# Patient Record
Sex: Female | Born: 1945 | Race: Black or African American | Hispanic: No | Marital: Single | Smoking: Never smoker
Health system: Southern US, Community
[De-identification: ages and names within clinical notes are randomized; demographics above are authoritative.]

## PROBLEM LIST (undated history)

## (undated) DIAGNOSIS — I10 Essential (primary) hypertension: Secondary | ICD-10-CM

## (undated) DIAGNOSIS — F209 Schizophrenia, unspecified: Secondary | ICD-10-CM

## (undated) DIAGNOSIS — R7303 Prediabetes: Secondary | ICD-10-CM

## (undated) DIAGNOSIS — F319 Bipolar disorder, unspecified: Secondary | ICD-10-CM

---

## 2011-10-27 DIAGNOSIS — F259 Schizoaffective disorder, unspecified: Secondary | ICD-10-CM | POA: Diagnosis not present

## 2011-10-27 DIAGNOSIS — F209 Schizophrenia, unspecified: Secondary | ICD-10-CM | POA: Diagnosis not present

## 2012-01-25 ENCOUNTER — Emergency Department (INDEPENDENT_AMBULATORY_CARE_PROVIDER_SITE_OTHER)
Admission: EM | Admit: 2012-01-25 | Discharge: 2012-01-25 | Disposition: A | Discharge status: Home / Self Care | Payer: Medicare Other | Source: Home / Self Care

## 2012-01-25 ENCOUNTER — Encounter (HOSPITAL_COMMUNITY): Payer: Self-pay

## 2012-01-25 DIAGNOSIS — I10 Essential (primary) hypertension: Secondary | ICD-10-CM

## 2012-01-25 DIAGNOSIS — R7309 Other abnormal glucose: Secondary | ICD-10-CM

## 2012-01-25 DIAGNOSIS — F319 Bipolar disorder, unspecified: Secondary | ICD-10-CM | POA: Diagnosis not present

## 2012-01-25 DIAGNOSIS — R7303 Prediabetes: Secondary | ICD-10-CM

## 2012-01-25 HISTORY — DX: Bipolar disorder, unspecified: F31.9

## 2012-01-25 HISTORY — DX: Prediabetes: R73.03

## 2012-01-25 HISTORY — DX: Essential (primary) hypertension: I10

## 2012-01-25 MED ORDER — TELMISARTAN 40 MG PO TABS: 40.0000 mg | ORAL_TABLET | Freq: Every day | ORAL | Status: DC

## 2012-01-25 MED ORDER — RISPERIDONE 2 MG PO TABS: 2.0000 mg | ORAL_TABLET | Freq: Two times a day (BID) | ORAL | Status: DC

## 2012-01-25 MED ORDER — QUETIAPINE FUMARATE 25 MG PO TABS: 25.0000 mg | ORAL_TABLET | Freq: Every day | ORAL | Status: DC
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
## 2012-01-25 NOTE — Discharge Instructions (Signed)
Your blood sugar at this time is good. As we discussed, you will need to have your blood sugar followed by your primary care dr and that there are better tests for following pre diabetes than a finger stick blood sugar. I have refilled your medications for one month. You should follow up with a primary care dr at that time for further refills.  Hypertension Information As your heart beats, it forces blood through your arteries. This force is your blood pressure. If the pressure is too high, it is called hypertension (HTN) or high blood pressure. HTN is dangerous because you may have it and not know it. High blood pressure may mean that your heart has to work harder to pump blood. Your arteries may be narrow or stiff. The extra work puts you at risk for heart disease, stroke, and other problems.  Blood pressure consists of two numbers, a higher number over a lower, 110/72, for example. It is stated as "110 over 72." The ideal is below 120 for the top number (systolic) and under 80 for the bottom (diastolic).  You should pay close attention to your blood pressure if you have certain conditions such as:  Heart failure.   Prior heart attack.   Diabetes   Chronic kidney disease.   Prior stroke.   Multiple risk factors for heart disease.  To see if you have HTN, your blood pressure should be measured while you are seated with your arm held at the level of the heart. It should be measured at least twice. A one-time elevated blood pressure reading (especially in the Emergency Department) does not mean that you need treatment. There may be conditions in which the blood pressure is different between your right and left arms. It is important to see your caregiver soon for a recheck. Most people have essential hypertension which means that there is not a specific cause. This type of high blood pressure may be lowered by changing lifestyle factors such as:  Stress.   Smoking.   Lack of exercise.    Excessive weight.   Drug/tobacco/alcohol use.   Eating less salt.  Most people do not have symptoms from high blood pressure until it has caused damage to the body. Effective treatment can often prevent, delay or reduce that damage. TREATMENT  Treatment for high blood pressure, when a cause has been identified, is directed at the cause. There are a large number of medications to treat HTN. These fall into several categories, and your caregiver will help you select the medicines that are best for you. Medications may have side effects. You should review side effects with your caregiver. If your blood pressure stays high after you have made lifestyle changes or started on medicines,   Your medication(s) may need to be changed.   Other problems may need to be addressed.   Be certain you understand your prescriptions, and know how and when to take your medicine.   Be sure to follow up with your caregiver within the time frame advised (usually within two weeks) to have your blood pressure rechecked and to review your medications.   If you are taking more than one medicine to lower your blood pressure, make sure you know how and at what times they should be taken. Taking two medicines at the same time can result in blood pressure that is too low.  Document Released: 12/08/2005 Document Revised: 06/17/2011 Document Reviewed: 12/15/2007 [facility] Patient Information 2012 [place], [place].

## 2012-01-30 NOTE — ED Provider Notes (Signed)
Medical screening examination/treatment/procedure(s) were performed by non-physician practitioner and as supervising physician I was immediately available for consultation/collaboration.  Alen Bleacher, MD 01/30/12 2101

## 2012-03-03 DIAGNOSIS — M25569 Pain in unspecified knee: Secondary | ICD-10-CM | POA: Diagnosis not present

## 2012-03-03 DIAGNOSIS — F319 Bipolar disorder, unspecified: Secondary | ICD-10-CM | POA: Diagnosis not present

## 2012-03-03 DIAGNOSIS — Z13220 Encounter for screening for lipoid disorders: Secondary | ICD-10-CM | POA: Diagnosis not present

## 2012-03-03 DIAGNOSIS — J3089 Other allergic rhinitis: Secondary | ICD-10-CM | POA: Diagnosis not present

## 2012-03-03 DIAGNOSIS — E119 Type 2 diabetes mellitus without complications: Secondary | ICD-10-CM | POA: Diagnosis not present

## 2012-03-03 DIAGNOSIS — I10 Essential (primary) hypertension: Secondary | ICD-10-CM | POA: Diagnosis not present

## 2012-03-03 DIAGNOSIS — M545 Low back pain: Secondary | ICD-10-CM | POA: Diagnosis not present

## 2012-03-16 DIAGNOSIS — E119 Type 2 diabetes mellitus without complications: Secondary | ICD-10-CM | POA: Diagnosis not present

## 2012-03-16 DIAGNOSIS — I10 Essential (primary) hypertension: Secondary | ICD-10-CM | POA: Diagnosis not present

## 2012-03-16 DIAGNOSIS — J3089 Other allergic rhinitis: Secondary | ICD-10-CM | POA: Diagnosis not present

## 2012-04-20 DIAGNOSIS — F209 Schizophrenia, unspecified: Secondary | ICD-10-CM | POA: Diagnosis not present

## 2012-05-20 ENCOUNTER — Encounter (HOSPITAL_COMMUNITY): Payer: Self-pay | Admitting: *Deleted

## 2012-05-20 ENCOUNTER — Emergency Department (HOSPITAL_COMMUNITY)
Admission: EM | Admit: 2012-05-20 | Discharge: 2012-05-21 | Disposition: A | Discharge status: Other Acute Inpatient Hospital | Payer: Medicare Other | Source: Home / Self Care | Attending: Emergency Medicine | Admitting: Emergency Medicine

## 2012-05-20 ENCOUNTER — Encounter (HOSPITAL_COMMUNITY): Payer: Self-pay

## 2012-05-20 ENCOUNTER — Emergency Department (INDEPENDENT_AMBULATORY_CARE_PROVIDER_SITE_OTHER)
Admission: EM | Admit: 2012-05-20 | Discharge: 2012-05-20 | Disposition: A | Discharge status: Home / Self Care | Payer: Medicare Other | Source: Home / Self Care | Attending: Emergency Medicine | Admitting: Emergency Medicine

## 2012-05-20 DIAGNOSIS — R443 Hallucinations, unspecified: Secondary | ICD-10-CM

## 2012-05-20 DIAGNOSIS — R440 Auditory hallucinations: Secondary | ICD-10-CM

## 2012-05-20 DIAGNOSIS — R441 Visual hallucinations: Secondary | ICD-10-CM

## 2012-05-20 DIAGNOSIS — R7309 Other abnormal glucose: Secondary | ICD-10-CM | POA: Insufficient documentation

## 2012-05-20 DIAGNOSIS — F29 Unspecified psychosis not due to a substance or known physiological condition: Secondary | ICD-10-CM

## 2012-05-20 DIAGNOSIS — Z79899 Other long term (current) drug therapy: Secondary | ICD-10-CM | POA: Insufficient documentation

## 2012-05-20 DIAGNOSIS — E876 Hypokalemia: Secondary | ICD-10-CM | POA: Insufficient documentation

## 2012-05-20 DIAGNOSIS — H5316 Psychophysical visual disturbances: Secondary | ICD-10-CM

## 2012-05-20 LAB — CBC
HCT: 33.2 % — ABNORMAL LOW (ref 36.0–46.0)
MCV: 81 fL (ref 78.0–100.0)
RBC: 4.1 MIL/uL (ref 3.87–5.11)
WBC: 6.9 10*3/uL (ref 4.0–10.5)

## 2012-05-20 MED ORDER — NAPROXEN SODIUM 550 MG PO TABS
550.0000 mg | ORAL_TABLET | Freq: Two times a day (BID) | ORAL | Status: DC
  Administered 2012-05-21: 550 mg via ORAL
  Filled 2012-05-20 (×4): qty 1

## 2012-05-20 MED ORDER — DIVALPROEX SODIUM 250 MG PO DR TAB
250.0000 mg | DELAYED_RELEASE_TABLET | Freq: Three times a day (TID) | ORAL | Status: DC
  Administered 2012-05-21 (×3): 250 mg via ORAL
  Filled 2012-05-20 (×3): qty 1

## 2012-05-20 MED ORDER — LURASIDONE HCL 40 MG PO TABS
40.0000 mg | ORAL_TABLET | Freq: Every day | ORAL | Status: DC
  Administered 2012-05-21: 40 mg via ORAL
  Filled 2012-05-20 (×2): qty 1

## 2012-05-20 MED ORDER — SERTRALINE HCL 50 MG PO TABS
50.0000 mg | ORAL_TABLET | Freq: Every day | ORAL | Status: DC
  Administered 2012-05-21: 50 mg via ORAL
  Filled 2012-05-20: qty 1

## 2012-05-20 MED ORDER — LORAZEPAM 1 MG PO TABS
1.0000 mg | ORAL_TABLET | Freq: Three times a day (TID) | ORAL | Status: DC | PRN
  Administered 2012-05-21: 1 mg via ORAL
  Filled 2012-05-20: qty 1

## 2012-05-20 MED ORDER — IRBESARTAN 150 MG PO TABS
150.0000 mg | ORAL_TABLET | Freq: Every day | ORAL | Status: DC
  Administered 2012-05-21: 150 mg via ORAL
  Filled 2012-05-20: qty 1

## 2012-05-20 MED ORDER — MIDAZOLAM HCL 2 MG/2ML IJ SOLN
INTRAMUSCULAR | Status: AC
  Administered 2012-05-20: 5 mg
  Filled 2012-05-20: qty 6

## 2012-05-20 MED ORDER — MIDAZOLAM HCL 5 MG/ML IJ SOLN: 5.0000 mg | Freq: Once | INTRAMUSCULAR | Status: DC

## 2012-05-20 MED ORDER — QUETIAPINE FUMARATE 25 MG PO TABS
25.0000 mg | ORAL_TABLET | Freq: Every day | ORAL | Status: DC
  Administered 2012-05-21: 25 mg via ORAL
  Filled 2012-05-20: qty 1

## 2012-05-20 NOTE — ED Provider Notes (Addendum)
History     CSN: [id number]  Arrival date & time 05/20/12  2129   First MD Initiated Contact with Patient 05/20/12 2235      Chief Complaint  Patient presents with  . Medical Clearance    (Consider location/radiation/quality/duration/timing/severity/associated sxs/prior treatment) HPI L5 caveat. Patient highly agitated, history is obtained from niece who lives with patient accompanies her. Niece reports patient has not been taking any of her medicines for the past several days becoming increasingly agitated and hallucinating. Patient has long-standing history of mental illness for several decades. Past Medical History  Diagnosis Date  . Hypertension   . Pre-diabetes   . Bipolar affective disorder     History reviewed. No pertinent past surgical history.  Family History  Problem Relation Age of Onset  . Hypertension Mother   . Colon cancer Mother   . Diabetes Mother   . Hypertension Father     History  Substance Use Topics  . Smoking status: Never Smoker   . Smokeless tobacco: Not on file  . Alcohol Use: No    OB History    Grav Para Term Preterm Abortions TAB SAB Ect Mult Living                  Review of Systems  Unable to perform ROS: Psychiatric disorder    Allergies  Review of patient's allergies indicates no known allergies.  Home Medications   Current Outpatient Rx  Name Route Sig Dispense Refill  . DIVALPROEX SODIUM 250 MG PO TBEC Oral Take 250 mg by mouth 3 (three) times daily.    Marland Kitchen LURASIDONE HCL 40 MG PO TABS Oral Take 40 mg by mouth at bedtime.    Marland Kitchen NAPROXEN SODIUM 550 MG PO TABS Oral Take 550 mg by mouth 2 (two) times daily with a meal.    . QUETIAPINE FUMARATE 25 MG PO TABS Oral Take 1 tablet (25 mg total) by mouth at bedtime. 30 tablet 0  . SERTRALINE HCL 50 MG PO TABS Oral Take 50 mg by mouth daily.    . TELMISARTAN 40 MG PO TABS Oral Take 1 tablet (40 mg total) by mouth daily. 30 tablet 0    BP 172/63  Pulse 66  Temp 98.8 F (37.1 C)  (Oral)  Resp 18  SpO2 99%  Physical Exam  Nursing note and vitals reviewed. Constitutional: She appears well-developed and well-nourished.       Highly agitated follow simple commands  HENT:  Head: Normocephalic and atraumatic.  Eyes: Conjunctivae are normal. Pupils are equal, round, and reactive to light.  Neck: Neck supple. No tracheal deviation present. No thyromegaly present.  Cardiovascular: Normal rate and regular rhythm.   Pulmonary/Chest: Effort normal and breath sounds normal.  Abdominal: Soft. She exhibits no distension. There is no tenderness.       Obese  Musculoskeletal: Normal range of motion. She exhibits no edema and no tenderness.  Neurological: She is alert. Coordination normal.  Skin: Skin is warm and dry. No rash noted.  Psychiatric:       Highly agitated, yelling    ED Course  Procedures (including critical care time)   Labs Reviewed  CBC  COMPREHENSIVE METABOLIC PANEL  ETHANOL  ACETAMINOPHEN LEVEL  URINE RAPID DRUG SCREEN (HOSP PERFORMED)   No results found. Results for orders placed during the hospital encounter of 05/20/12  CBC      Component Value Range   WBC 6.9  4.0 - 10.5 K/uL   RBC 4.10  3.87 - 5.11 MIL/uL   Hemoglobin 11.1 (*) 12.0 - 15.0 g/dL   HCT 16.1 (*) 09.6 - 04.5 %   MCV 81.0  78.0 - 100.0 fL   MCH 27.1  26.0 - 34.0 pg   MCHC 33.4  30.0 - 36.0 g/dL   RDW 40.9  81.1 - 91.4 %   Platelets 155  150 - 400 K/uL  COMPREHENSIVE METABOLIC PANEL      Component Value Range   Sodium 141  135 - 145 mEq/L   Potassium 2.9 (*) 3.5 - 5.1 mEq/L   Chloride 103  96 - 112 mEq/L   CO2 29  19 - 32 mEq/L   Glucose, Bld 144 (*) 70 - 99 mg/dL   BUN 9  6 - 23 mg/dL   Creatinine, Ser 7.82  0.50 - 1.10 mg/dL   Calcium 9.3  8.4 - 95.6 mg/dL   Total Protein 7.3  6.0 - 8.3 g/dL   Albumin 3.8  3.5 - 5.2 g/dL   AST 20  0 - 37 U/L   ALT 12  0 - 35 U/L   Alkaline Phosphatase 57  39 - 117 U/L   Total Bilirubin 0.3  0.3 - 1.2 mg/dL   GFR calc non Af Amer  65 (*) >90 mL/min   GFR calc Af Amer 75 (*) >90 mL/min  ETHANOL      Component Value Range   Alcohol, Ethyl (B) <11  0 - 11 mg/dL  ACETAMINOPHEN LEVEL      Component Value Range   Acetaminophen (Tylenol), Serum <15.0  10 - 30 ug/mL   No results found.   No diagnosis found. 11:20 PM patient much more pleasant cooperative and calm appearing treatment with versed IM  MDM  Plan psychiatric evaluation Diagnosis #1 psychosis #2 hypokalemia #3 hyperglycemia      Doug Sou, MD 05/21/12 2130  Doug Sou, MD 05/21/12 0031  Psychiatry consultation is appreciated. Psychiatry recommends inpatient psychiatric care and does not have any recommendations for changing her medications.  Dione Booze, MD 05/21/12 [phone]

## 2012-05-20 NOTE — ED Notes (Signed)
Pt presents with family.  Pt admits to not taking her medication- Admits to hearing voices.  Admit voices are telling her to harm self and others.  Admit to SI/HI

## 2012-05-20 NOTE — ED Notes (Signed)
Triage states do not draw pt's labs at this time pt. Is combative.  Will inform lab staff when is ok to obtain labs.
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
## 2012-05-20 NOTE — ED Notes (Signed)
Per niece ( pt lives with) pt has not been taking meds for over one week, states that she has been having visual and auditory hallucinations. Pt difficult to understand. Family reports that ambulance was called for tx last week and pt refused. Has been hospitalized for mental illness in the past - many years ago per family.

## 2012-05-21 ENCOUNTER — Inpatient Hospital Stay (HOSPITAL_COMMUNITY)
Admission: RE | Admit: 2012-05-21 | Discharge: 2012-06-08 | DRG: 885 | Disposition: A | Discharge status: Home / Self Care | Payer: Medicare Other | Source: Ambulatory Visit | Attending: Psychiatry | Admitting: Psychiatry

## 2012-05-21 ENCOUNTER — Encounter (HOSPITAL_COMMUNITY): Payer: Self-pay | Admitting: *Deleted

## 2012-05-21 DIAGNOSIS — F200 Paranoid schizophrenia: Secondary | ICD-10-CM

## 2012-05-21 DIAGNOSIS — R7309 Other abnormal glucose: Secondary | ICD-10-CM | POA: Diagnosis present

## 2012-05-21 DIAGNOSIS — I10 Essential (primary) hypertension: Secondary | ICD-10-CM | POA: Diagnosis present

## 2012-05-21 DIAGNOSIS — Z79899 Other long term (current) drug therapy: Secondary | ICD-10-CM

## 2012-05-21 LAB — COMPREHENSIVE METABOLIC PANEL
AST: 20 U/L (ref 0–37)
Alkaline Phosphatase: 57 U/L (ref 39–117)
BUN: 9 mg/dL (ref 6–23)
CO2: 29 mEq/L (ref 19–32)
Chloride: 103 mEq/L (ref 96–112)
Creatinine, Ser: 0.92 mg/dL (ref 0.50–1.10)
GFR calc non Af Amer: 65 mL/min — ABNORMAL LOW (ref >90)
Total Bilirubin: 0.3 mg/dL (ref 0.3–1.2)

## 2012-05-21 LAB — VALPROIC ACID LEVEL: Valproic Acid Lvl: 52 ug/mL (ref 50.0–100.0)

## 2012-05-21 LAB — RAPID URINE DRUG SCREEN, HOSP PERFORMED
Amphetamines: NOT DETECTED
Barbiturates: NOT DETECTED

## 2012-05-21 LAB — ETHANOL: Alcohol, Ethyl (B): <11 mg/dL (ref 0–11)

## 2012-05-21 LAB — ACETAMINOPHEN LEVEL: Acetaminophen (Tylenol), Serum: <15 ug/mL (ref 10–30)

## 2012-05-21 MED ORDER — CLONIDINE HCL 0.1 MG PO TABS
0.2000 mg | ORAL_TABLET | Freq: Once | ORAL | Status: AC
  Administered 2012-05-21: 0.2 mg via ORAL
  Filled 2012-05-21: qty 1

## 2012-05-21 MED ORDER — MAGNESIUM HYDROXIDE 400 MG/5ML PO SUSP: 30.0000 mL | Freq: Every day | ORAL | Status: DC | PRN

## 2012-05-21 MED ORDER — SERTRALINE HCL 50 MG PO TABS
50.0000 mg | ORAL_TABLET | Freq: Every day | ORAL | Status: DC
  Administered 2012-05-22 – 2012-06-08 (×18): 50 mg via ORAL
  Filled 2012-05-21 (×18): qty 1
  Filled 2012-05-21: qty 14

## 2012-05-21 MED ORDER — LURASIDONE HCL 40 MG PO TABS
40.0000 mg | ORAL_TABLET | Freq: Every day | ORAL | Status: DC
  Administered 2012-05-21 – 2012-05-22 (×2): 40 mg via ORAL
  Filled 2012-05-21 (×4): qty 1

## 2012-05-21 MED ORDER — QUETIAPINE FUMARATE 25 MG PO TABS
25.0000 mg | ORAL_TABLET | Freq: Every day | ORAL | Status: DC
  Administered 2012-05-21: 25 mg via ORAL
  Filled 2012-05-21 (×4): qty 1

## 2012-05-21 MED ORDER — QUETIAPINE FUMARATE 25 MG PO TABS: 25.0000 mg | ORAL_TABLET | Freq: Every day | ORAL | Status: DC

## 2012-05-21 MED ORDER — DIVALPROEX SODIUM ER 250 MG PO TB24: 250.0000 mg | ORAL_TABLET | Freq: Three times a day (TID) | ORAL | Status: DC

## 2012-05-21 MED ORDER — DIVALPROEX SODIUM 250 MG PO DR TAB
250.0000 mg | DELAYED_RELEASE_TABLET | Freq: Three times a day (TID) | ORAL | Status: DC
  Administered 2012-05-22 – 2012-05-23 (×5): 250 mg via ORAL
  Filled 2012-05-21 (×7): qty 1

## 2012-05-21 MED ORDER — IRBESARTAN 150 MG PO TABS: 150.0000 mg | ORAL_TABLET | Freq: Every day | ORAL | Status: DC

## 2012-05-21 MED ORDER — FLUTICASONE PROPIONATE 50 MCG/ACT NA SUSP
2.0000 | Freq: Every day | NASAL | Status: DC
  Administered 2012-05-22 – 2012-06-07 (×16): 2 via NASAL
  Filled 2012-05-21 (×2): qty 16

## 2012-05-21 MED ORDER — NAPROXEN SODIUM 550 MG PO TABS: 550.0000 mg | ORAL_TABLET | Freq: Two times a day (BID) | ORAL | Status: DC | PRN

## 2012-05-21 MED ORDER — ACETAMINOPHEN 325 MG PO TABS
650.0000 mg | ORAL_TABLET | Freq: Four times a day (QID) | ORAL | Status: DC | PRN
  Administered 2012-05-30: 650 mg via ORAL

## 2012-05-21 MED ORDER — ALUM & MAG HYDROXIDE-SIMETH 200-200-20 MG/5ML PO SUSP: 30.0000 mL | ORAL | Status: DC | PRN

## 2012-05-21 MED ORDER — IRBESARTAN 150 MG PO TABS
150.0000 mg | ORAL_TABLET | Freq: Every day | ORAL | Status: DC
  Administered 2012-05-22 – 2012-06-08 (×18): 150 mg via ORAL
  Filled 2012-05-21 (×9): qty 1
  Filled 2012-05-21: qty 14
  Filled 2012-05-21 (×9): qty 1

## 2012-05-21 MED ORDER — LURASIDONE HCL 40 MG PO TABS: 40.0000 mg | ORAL_TABLET | Freq: Every day | ORAL | Status: DC

## 2012-05-21 MED ORDER — SERTRALINE HCL 50 MG PO TABS: 50.0000 mg | ORAL_TABLET | Freq: Every day | ORAL | Status: DC

## 2012-05-21 MED ORDER — POTASSIUM CHLORIDE CRYS ER 20 MEQ PO TBCR
40.0000 meq | EXTENDED_RELEASE_TABLET | Freq: Two times a day (BID) | ORAL | Status: DC
  Administered 2012-05-21 (×2): 40 meq via ORAL
  Filled 2012-05-21 (×2): qty 2

## 2012-05-21 MED ORDER — NAPROXEN SODIUM 550 MG PO TABS
550.0000 mg | ORAL_TABLET | Freq: Two times a day (BID) | ORAL | Status: DC
  Filled 2012-05-21: qty 1

## 2012-05-21 NOTE — Progress Notes (Signed)
Patient ID: Kaylee Kelley, female   DOB: [birth year], 66 y.o.   MRN: [id number] 05-21-12 @ 1913 nursing admission note: pt came into [facility] voluntarily with an admitting dx of psychotic d/o nos. She has a hx of bipolar affective d/o. During the admission process she was looking area the room bewildered. rn ask he if she was hallucinating and she stated "yes'. She verbalized auditory and visual hallucinations. She denied any si/hi. She is an extremely poor historian. She stated she is a non smoke, had no pain, no allergies, and did not seem to be a fall risk. She stated she lives alone is single and had one child that is deceased. She staed she has 2 grand children. She supports herself on disability. This pt was difficult to assess and many questions were unanswered and her care plans are incomplete.  Pt was given food/drink and escorted to the 400 hall. She was cooperative.   Emergency contact is yvonne pinder-niece- pt didn't know the ph # at this time.

## 2012-05-21 NOTE — BH Assessment (Signed)
Assessment Note   Kaylee Kelley is an 66 y.o. female who voluntarily presented to [facility] Emergency Department with the chief complaint of psychosis, evidenced by auditory and visual hallucinations experienced by patient. During assessment patient was observed to be a poor historian but demonstrated orientation x4. Patient reported that she is currently in the ED because "my nerves were bad. I was sick." Patient exhibits pressured speech at low volume and flat affect. Patient reported that she has a poor appetite and has recently suffered from episodes of insomnia. Patient has a noted mental health diagnosis of Bipolar affective disorder. Patient reported to assessor that she continues to hear a female voice constantly. "I hear this voice all the time. He just keeps talking." Patient also stated that she sees images and shadows that are trying to hit her. "Thats why I can't sleep because I see them." Per pt's niece, pt has not been taking any of her medications for the past several days, stimulating more agitation and episodes of hallucinations. Patient denies SI/HI at this time but does require inpatient treatment for stabilization due to psychosis.   Axis I: Psychotic Disorder NOS Axis II: No diagnosis Axis III:  Past Medical History  Diagnosis Date  . Hypertension   . Pre-diabetes   . Bipolar affective disorder    Axis IV: other psychosocial or environmental problems Axis V: 21-30 behavior considerably influenced by delusions or hallucinations OR serious impairment in judgment, communication OR inability to function in almost all areas  Past Medical History:  Past Medical History  Diagnosis Date  . Hypertension   . Pre-diabetes   . Bipolar affective disorder     History reviewed. No pertinent past surgical history.  Family History:  Family History  Problem Relation Age of Onset  . Hypertension Mother   . Colon cancer Mother   . Diabetes Mother   . Hypertension Father      Social History:  reports that she has never smoked. She does not have any smokeless tobacco history on file. She reports that she does not drink alcohol or use illicit drugs.  Additional Social History:  Alcohol / Drug Use Pain Medications: See MAR Prescriptions: See MAR Over the Counter: See MAR History of alcohol / drug use?: No history of alcohol / drug abuse  CIWA: CIWA-Ar BP: 179/93 mmHg Pulse Rate: 60  COWS:    Allergies: No Known Allergies  Home Medications:  (Not in a hospital admission)  OB/GYN Status:  No LMP recorded. Patient is postmenopausal.  General Assessment Data Location of Assessment: [facility] ED Living Arrangements: Other relatives (Niece) Can pt return to current living arrangement?: Yes Admission Status: Voluntary Is patient capable of signing voluntary admission?: No Transfer from: Acute Hospital Referral Source: Self/Family/Friend  Education Status Is patient currently in school?: No  Risk to self Suicidal Ideation: No-Not Currently/Within Last 6 Months Suicidal Intent: No-Not Currently/Within Last 6 Months Is patient at risk for suicide?: Yes Suicidal Plan?: No Access to Means: Yes Specify Access to Suicidal Means: Access to sharp objects  What has been your use of drugs/alcohol within the last 12 months?: N/A Previous Attempts/Gestures:  (Unknown) Triggers for Past Attempts: Unknown Intentional Self Injurious Behavior: None Family Suicide History: No Recent stressful life event(s): Other (Comment) (Recent psychosis) Persecutory voices/beliefs?: No Depression: Yes Depression Symptoms: Loss of interest in usual pleasures;Insomnia Substance abuse history and/or treatment for substance abuse?: No Suicide prevention information given to non-admitted patients: Not applicable  Risk to Others Homicidal Ideation: No-Not Currently/Within  Last 6 Months Thoughts of Harm to Others: No-Not Currently Present/Within Last 6 Months Current Homicidal  Intent: No-Not Currently/Within Last 6 Months Current Homicidal Plan: No-Not Currently/Within Last 6 Months Access to Homicidal Means: No Identified Victim: N/A History of harm to others?: No Assessment of Violence: None Noted Violent Behavior Description: Pt is calm and cooperative Does patient have access to weapons?: No Criminal Charges Pending?: No Does patient have a court date: No  Psychosis Hallucinations: Auditory;Visual (Pt hears female voice talking to her consistently and sees ima) Delusions: None noted  Mental Status Report Appear/Hygiene: Disheveled Eye Contact: Fair Motor Activity: Freedom of movement;Agitation;Restlessness Speech: Slow;Pressured Level of Consciousness: Quiet/awake Mood: Empty;Helpless;Sad Affect: Appropriate to circumstance;Blunted Anxiety Level: None Thought Processes: Coherent;Flight of Ideas Judgement: Impaired Orientation: Person;Place;Time;Situation Obsessive Compulsive Thoughts/Behaviors: None  Cognitive Functioning Concentration: Decreased Memory: Recent Intact IQ: Average Insight: Poor Impulse Control: Poor Appetite: Fair Weight Loss: 0  Weight Gain: 0  Sleep: Decreased Total Hours of Sleep: 5  Vegetative Symptoms: None  ADLScreening [facility] Assessment Services) Patient's cognitive ability adequate to safely complete daily activities?: Yes Patient able to express need for assistance with ADLs?: Yes Independently performs ADLs?: Yes  Abuse/Neglect [facility]) Physical Abuse: Denies Verbal Abuse: Denies Sexual Abuse: Denies  Prior Inpatient Therapy Prior Inpatient Therapy:  (Pt unable to provide information)  Prior Outpatient Therapy Prior Outpatient Therapy:  (Pt unable to provide information. )  ADL Screening (condition at time of admission) Patient's cognitive ability adequate to safely complete daily activities?: Yes Patient able to express need for assistance with ADLs?: Yes Independently performs ADLs?: Yes Weakness of  Legs: None Weakness of Arms/Hands: None  Home Assistive Devices/Equipment Home Assistive Devices/Equipment: None  Therapy Consults (therapy consults require a physician order) PT Evaluation Needed: No OT Evalulation Needed: No SLP Evaluation Needed: No Abuse/Neglect Assessment (Assessment to be complete while patient is alone) Physical Abuse: Denies Verbal Abuse: Denies Sexual Abuse: Denies Exploitation of patient/patient's resources: Denies Self-Neglect: Denies Values / Beliefs Cultural Requests During Hospitalization: None Spiritual Requests During Hospitalization: None Consults Spiritual Care Consult Needed: No Social Work Consult Needed: No      Additional Information 1:1 In Past 12 Months?: No CIRT Risk: No Elopement Risk: No Does patient have medical clearance?: Yes     Disposition: Referral to [facility]  Disposition Disposition of Patient: Inpatient treatment program Type of inpatient treatment program: Adult  On Site Evaluation by:  Self Reviewed with Physician:  Dr. Ella Bodo, Yilia Sacca C 05/21/2012 10:45 AM

## 2012-05-21 NOTE — Tx Team (Signed)
Initial Interdisciplinary Treatment Plan  PATIENT STRENGTHS: (choose at least two) Supportive family/friends  PATIENT STRESSORS: psychosis   PROBLEM LIST: Problem List/Patient Goals Date to be addressed Date deferred Reason deferred Estimated date of resolution  Positive for auditory and visual hallucinations.  05-21-12                                                      DISCHARGE CRITERIA:  Improved stabilization in mood, thinking, and/or behavior posit  PRELIMINARY DISCHARGE PLAN: Attend aftercare/continuing care group  PATIENT/FAMIILY INVOLVEMENT: This treatment plan has been presented to and reviewed with the patient, [employer], and/or family member, .  The patient and family have been given the opportunity to ask questions and make suggestions.  Valente David 05/21/2012, 7:19 PM

## 2012-05-21 NOTE — ED Notes (Signed)
Dr Radford Pax aware of increase Bp  And to repeat potassium labs For [facility] admssion

## 2012-05-22 DIAGNOSIS — F200 Paranoid schizophrenia: Secondary | ICD-10-CM | POA: Diagnosis not present

## 2012-05-22 LAB — GLUCOSE, CAPILLARY
Glucose-Capillary: 90 mg/dL (ref 70–99)
Glucose-Capillary: 92 mg/dL (ref 70–99)

## 2012-05-22 MED ORDER — QUETIAPINE FUMARATE 50 MG PO TABS: 50.0000 mg | ORAL_TABLET | ORAL | Status: DC | PRN

## 2012-05-22 MED ORDER — NAPROXEN 500 MG PO TABS
500.0000 mg | ORAL_TABLET | Freq: Two times a day (BID) | ORAL | Status: DC
  Administered 2012-05-22 – 2012-06-08 (×31): 500 mg via ORAL
  Filled 2012-05-22 (×19): qty 1
  Filled 2012-05-22: qty 28
  Filled 2012-05-22 (×2): qty 1
  Filled 2012-05-22: qty 28
  Filled 2012-05-22 (×15): qty 1

## 2012-05-22 MED ORDER — QUETIAPINE FUMARATE 50 MG PO TABS
50.0000 mg | ORAL_TABLET | Freq: Every day | ORAL | Status: DC
  Administered 2012-05-22: 50 mg via ORAL
  Filled 2012-05-22 (×3): qty 1

## 2012-05-22 NOTE — Progress Notes (Signed)
[facility] Group Notes:  (Counselor/Nursing/MHT/Case Management/Adjunct)  05/22/2012 11 AM  Type of Therapy:  Aftercare Planning, Group Therapy, Dance/Movement Therapy   Participation Level:  Did Not Attend  Summary of Progress/Problems: pt did not attend but did accept the daily workbook on support systems.  Kaylee Kelley 05/22/2012. 11:38 AM  

## 2012-05-22 NOTE — Progress Notes (Signed)
Psychoeducational Group Note  Date:  05/22/2012 Time:  0945 am  Group Topic/Focus:  Making Healthy Choices:   The focus of this group is to help patients identify negative/unhealthy choices they were using prior to admission and identify positive/healthier coping strategies to replace them upon discharge.  Participation Level:  Did Not Attend   Kaylee Kelley J 05/22/2012, 10:29 AM  

## 2012-05-22 NOTE — H&P (Signed)
  Pt was seen by me today. Will continue current meds.  The detailed H&P note will be done by mid level (NP/PA).  
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
## 2012-05-22 NOTE — Progress Notes (Signed)
Patient received her scheduled night medications and was pleasant and cooperative. Patient appears anxious and her speech is soft and pressured.  Patient requested fixodent for her dentures which was given. Patient voiced no complaints. Writer offered support and encouragement, denies having pain. Safety maintained on unit, will continue to monitor.
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
## 2012-05-22 NOTE — Progress Notes (Signed)
Pt has been awake in room with door open laughing and talking loud with no one in the room. Writer unable to identify what pt is saying. Speech is loud and fast. When writer enters the room, pt becomes quiet and avoids eye contact. Pt denies si and hi. She denies hallucinations. She appears to be responding to internal stimuli. Pt has curtains closed, lights off and door to room open. When writer asked if door could be closed, pt responded "no." Asked pt if she could lower voice and she responded yes as other patients on unit were being disturbed. Reported to PA.. MD to evaluate. No new orders at this time. Gave medications as ordered. Rechecked vitals. Safety maintained on unit.

## 2012-05-23 DIAGNOSIS — F200 Paranoid schizophrenia: Principal | ICD-10-CM

## 2012-05-23 LAB — URINALYSIS, ROUTINE W REFLEX MICROSCOPIC
Leukocytes, UA: NEGATIVE
Nitrite: NEGATIVE
Specific Gravity, Urine: 1.016 (ref 1.005–1.030)
pH: 6.5 (ref 5.0–8.0)

## 2012-05-23 MED ORDER — DIVALPROEX SODIUM ER 500 MG PO TB24
1000.0000 mg | ORAL_TABLET | Freq: Every day | ORAL | Status: DC
  Administered 2012-05-24 – 2012-05-26 (×3): 1000 mg via ORAL
  Filled 2012-05-23 (×5): qty 2

## 2012-05-23 MED ORDER — LURASIDONE HCL 40 MG PO TABS
40.0000 mg | ORAL_TABLET | Freq: Two times a day (BID) | ORAL | Status: DC
  Administered 2012-05-24 – 2012-05-27 (×7): 40 mg via ORAL
  Filled 2012-05-23 (×11): qty 1

## 2012-05-23 NOTE — Treatment Plan (Signed)
Interdisciplinary Treatment Plan Update (Adult) Date: 05/23/2012  Time Reviewed: 10:02 AM  Progress in Treatment: Attending groups: Yes Participating in groups: Yes Taking medication as prescribed: Yes Tolerating medication: Yes Family/Significant othe contact made: No contacts needed to be made with pt. niece Patient understands diagnosis: Yes Discussing patient identified problems/goals with staff: Yes Medical problems stabilized or resolved: Yes Denies suicidal/homicidal ideation: Yes Issues/concerns per patient self-inventory: None identified Other: N/A New problem(s) identified: None Identified Reason for Continuation of Hospitalization: Aggression Medication stabilization Suicidal ideation Interventions implemented related to continuation of hospitalization: mood stabilization, medication monitoring and adjustment, group therapy and psycho education, safety checks q 15 mins Additional comments: N/A Estimated length of stay:  Discharge Plan: SW is assessing for appropriate referrals.  New goal(s): N/A Review of initial/current patient goals per problem list:  1. Goal(s): Observation to assess symptoms, need for medications.  Met: No  Target date: By Discharge  As evidenced by: Just arrived 05-21-12 2. Goal(s): Gather collateral information  Met: No  Target date: By Discharge  As evidenced by: New 3. Goal(s): Education re resources.  Met: No  Target date: By Discharge  As evidenced by: New 4. Goal(s): Provide Suicide Prevention Information  Met: Yes  Target date: By Discharge  As evidenced by: Done in Aftercare Planning Group Attendees: Patient:    Family:    Physician: Kaylee Gallo, MD 05/23/2012 10:02 AM   Nursing:    Case Manager: Clarice Pole, LCASA 05/23/2012 10:02 AM   Counselor: Veto Kemps, MT-BC 05/23/2012 10:02 AM   Other: Joslyn Devon, RN 05/23/2012 10:02 AM   Other:    Other:    Other:    Scribe for Treatment Team:  Clarice Pole, LCASA  05/23/2012 10:02 AM

## 2012-05-23 NOTE — Discharge Planning (Signed)
05/23/2012  Pt did not attend d/c planning group on this date. SW met with pt individually at this time.   SW met with pt in treatment team.    Per State Regulation 482.30 This Chart was reviewed for medical necessity with respect to the patient's Admission/Duration of stay.  Kaylee Kelley 05/23/2012 Next Review Date:   Kaylee Kelley 05/23/2012, 5:15 PM

## 2012-05-23 NOTE — Progress Notes (Signed)
[facility] MD Progress Note  05/23/2012 7:00 PM  Diagnosis:  Axis I: Schizophrenia - Paranoid Type.   The patient was seen today and reports the following:   ADL's: Intact.  Sleep: The patient reports to having difficulty initiating and maintaining sleep. Appetite: The patient reports that her appetite is improving today.   Mild>(1-10) >Severe  Hopelessness (1-10): 0  Depression (1-10): 3-4  Anxiety (1-10): 0   Suicidal Ideation: The patient denies any current suicidal ideations today.  Plan: No  Intent: No  Means: No   Homicidal Ideation: The patient denies any homicidal ideations today.  Plan: No  Intent: No.  Means: No   General Appearance/Behavior: The patient was mild to moderately agitated and was minimally cooperative with this provider. Eye Contact: Good.  Speech: Appropriate in rate and volume with no pressuring noted today.  Motor Behavior: wnl.  Level of Consciousness: Alert and Oriented x 3.  Mental Status: Alert and Oriented x 3.  Mood: Mild to moderately depressed.  Affect: Moderately agitated.  Anxiety Level: No anxiety reported today.  Thought Process: Psychotic.  Thought Content: The patient appears to be experiencing auditory hallucinations as well as paranoid delusions.  She denies any visual hallucinations. Perception:. Psychotic.  Judgment: Poor.  Insight: Poor.  Cognition: Oriented to person, place and time.  Sleep:  Number of Hours: 2.75    Vital Signs:Blood pressure 150/87, pulse 58, temperature 98.3 F (36.8 C), temperature source Oral, resp. rate 20, height 5' 1.5" (1.562 m), weight 69.854 kg (154 lb), SpO2 100.00%.  Current Medications: Current Facility-Administered Medications  Medication Dose Route Frequency Provider Last Rate Last Dose  . acetaminophen (TYLENOL) tablet 650 mg  650 mg Oral Q6H PRN Verne Spurr, PA-C      . alum & mag hydroxide-simeth (MAALOX/MYLANTA) 200-200-20 MG/5ML suspension 30 mL  30 mL Oral Q4H PRN Verne Spurr, PA-C       . divalproex (DEPAKOTE ER) 24 hr tablet 1,000 mg  1,000 mg Oral QHS Curlene Labrum Pamula Luther, MD      . fluticasone (FLONASE) 50 MCG/ACT nasal spray 2 spray  2 spray Each Nare Daily Ronny Bacon, MD   2 spray at 05/23/12 [phone]  . irbesartan (AVAPRO) tablet 150 mg  150 mg Oral Daily Curlene Labrum Marche Hottenstein, MD   150 mg at 05/23/12 0830  . lurasidone (LATUDA) tablet 40 mg  40 mg Oral BID WC Merrilyn Legler D Willoughby Doell, MD      . magnesium hydroxide (MILK OF MAGNESIA) suspension 30 mL  30 mL Oral Daily PRN Verne Spurr, PA-C      . naproxen (NAPROSYN) tablet 500 mg  500 mg Oral BID WC Curlene Labrum Jackelin Correia, MD   500 mg at 05/23/12 1736  . QUEtiapine (SEROQUEL) tablet 50 mg  50 mg Oral PRN Wonda Cerise, MD      . sertraline (ZOLOFT) tablet 50 mg  50 mg Oral Daily Curlene Labrum Shaunte Weissinger, MD   50 mg at 05/23/12 0830  . DISCONTD: divalproex (DEPAKOTE) DR tablet 250 mg  250 mg Oral TID Verne Spurr, PA-C   250 mg at 05/23/12 1159  . DISCONTD: lurasidone (LATUDA) tablet 40 mg  40 mg Oral QHS Verne Spurr, PA-C   40 mg at 05/22/12 2137  . DISCONTD: QUEtiapine (SEROQUEL) tablet 25 mg  25 mg Oral QHS Verne Spurr, PA-C   25 mg at 05/21/12 2157  . DISCONTD: QUEtiapine (SEROQUEL) tablet 50 mg  50 mg Oral QHS Wonda Cerise, MD   50 mg at 05/22/12  2137   Lab Results:  Results for orders placed during the hospital encounter of 05/21/12 (from the past 48 hour(s))  GLUCOSE, CAPILLARY     Status: Normal   Collection Time   05/22/12  5:50 AM      Component Value Range Comment   Glucose-Capillary 90  70 - 99 mg/dL   GLUCOSE, CAPILLARY     Status: Normal   Collection Time   05/22/12  9:39 PM      Component Value Range Comment   Glucose-Capillary 92  70 - 99 mg/dL   GLUCOSE, CAPILLARY     Status: Normal   Collection Time   05/23/12  6:22 AM      Component Value Range Comment   Glucose-Capillary 93  70 - 99 mg/dL    Physical Findings: AIMS: Facial and Oral Movements Muscles of Facial Expression: None, normal Lips and Perioral Area: None,  normal Jaw: None, normal Tongue: None, normal,Extremity Movements Upper (arms, wrists, hands, fingers): None, normal Lower (legs, knees, ankles, toes): None, normal, Trunk Movements Neck, shoulders, hips: None, normal, Overall Severity Severity of abnormal movements (highest score from questions above): None, normal Incapacitation due to abnormal movements: None, normal Patient's awareness of abnormal movements (rate only patient's report): No Awareness, Dental Status Current problems with teeth and/or dentures?: No Does patient usually wear dentures?: Yes  CIWA:  CIWA-Ar Total: 0  COWS:  COWS Total Score: 0   Review of Systems:  Neurological: The patient denies any headaches today. She denies any seizures or dizziness.  G.I.: The patient denies any constipation today or G.I. Upset.  Musculoskeletal: The patient denies any muscle or skeletal difficulties.   Time was spent today discussing with the patient her current symptoms. The patient reports that her sleep is improving as well as her appetite.  The patient reports mild to moderate feelings of sadness, anhedonia and depressed mood.  The patient denies any suicidal or homicidal ideations as well as any anxiety symptoms.  The patient denies any visual hallucinations but appears to be having both auditory hallucinations and paranoid delusions.  The patient presents for evaluation and treatment of the above symptoms.  Treatment Plan Summary:  1. Daily contact with patient to assess and evaluate symptoms and progress in treatment.  2. Medication management  3. The patient will deny suicidal ideations or homicidal ideations for 48 hours prior to discharge and have a depression and anxiety rating of 3 or less. The patient will also deny any auditory or visual hallucinations or delusional thinking.  4. The patient will deny any symptoms of substance withdrawal at time of discharge.   Plan:  1. Will start the medication Depakote ER at 1000 mgs  po qhs for mood stabilization. 2. Will increase the medication Latuda to 40 mgs po BID-WC for psychosis. 3. Will continue the medication Zoloft at 50 mgs po q am for depression. 4. Laboratory studies reviewed.  5. Will continue to monitor.    Kaylee Kelley 05/23/2012, 7:00 PM

## 2012-05-23 NOTE — Progress Notes (Addendum)
D: Met with pts Niece who had some concerns about pts nutrition. She requested that MD allow her family to bring her "home cooked [place] food" as she does not think she will eat anything else. In presence of Niece Clinical research associate offered pt a cup of fruit and Niece encouraged her to eat that until tomorrow. Pt agreed she would. However, writer found a full fruit cup in her trash can later. Niece also mentioned that she will not be able to get here until later tomorrow evening r/t working. Encouraged her to call tomorrow and give staff an idea of when she could get here and staff would work with her to accomodate pts need. Pt was guarded and was focused on her Niece. She dismissed any AVH/SI/HI. Pt offered no questions or concerns.  A: Safety has been maintained with Q15 minute observation. Support and encouragement provided. POC and medications for the shift reviewed and understanding verbalized. Discussed niece's request with Dr. Allena Katz who approved for the family to bring meals in.   R: Pt remains safe. She is blunted and guarded with questionable psychosis. She is compliant with her treatment goals and offers no questions or concerns otherwise. Will continue Q15 minute observation and continue current POC   0650: Pt with BP of 183/102 and P=92 manually. Dr. Ferol Luz notified and she ordered pt to get her 0800 dose of ibesartan 150mg  stat and recheck BP 30 minutes after administered. Writer attempted to to administer med but pt is too psychotic and agitated. She flatly refused to take any meds. Will continue to encourage compliance at every opportunity and continue current POC.

## 2012-05-23 NOTE — Progress Notes (Signed)
Writer observed patient sitting in the dayroom wrapped in a blanket with her head covered and face out. Patient has been in and out of the dayroom and her room mate has complained of patient calling her a bitch earlier and staring at her fussing for no reason. Patient received her scheduled medications and voiced no complaints. Patient is preoccupied and is responding to internal stimuli. Safety maintained on unit, will continue to monitor.

## 2012-05-23 NOTE — ED Provider Notes (Signed)
Medical screening examination/treatment/procedure(s) were performed by non-physician practitioner and as supervising physician I was immediately available for consultation/collaboration.  Luiz Blare MD   Luiz Blare, MD 05/23/12 [phone]

## 2012-05-23 NOTE — Progress Notes (Signed)
[facility] Group Notes:  (Counselor/Nursing/MHT/Case Management/Adjunct)  05/23/2012  2:30  PM  Type of Therapy: Group Therapy   Participation Level: Did not attend.  Was asleep.      Marni Griffon C 05/23/2012  2:30 PM

## 2012-05-23 NOTE — Progress Notes (Addendum)
Patient would not fill out self inventory sheet this morning.   Has been working on sheet this afternoon.   In treatment team stated she has been sleeping better, has improved appetite, improved attention span. 1800  Patient has not attended groups today.   Dinner brought to her tonight and patient refused.   Presently resting in bed.  Denied pain.   Denied SI and HI.   Denied A/V hallucinations.  Self inventory put in patient's chart.  Staff has encouraged patient to eat dinner and go to groups today.

## 2012-05-23 NOTE — Progress Notes (Signed)
Psychoeducational Group Note  Date:  05/23/2012 Time:    Group Topic/Focus:  Self Care:   The focus of this group is to help patients understand the importance of self-care in order to improve or restore emotional, physical, spiritual, interpersonal, and financial health.  Participation Level:  Did Not Attend  Participation Quality:    Affect:    Cognitive:    Insight:    Engagement in Group:    Additional Comments:  none  [name], Ethne Jeon 05/23/2012, 4:36 PM

## 2012-05-24 DIAGNOSIS — F200 Paranoid schizophrenia: Secondary | ICD-10-CM | POA: Diagnosis not present

## 2012-05-24 LAB — T4, FREE: Free T4: 1.45 ng/dL (ref 0.80–1.80)

## 2012-05-24 LAB — TSH: TSH: 1.487 u[IU]/mL (ref 0.350–4.500)

## 2012-05-24 MED ORDER — HALOPERIDOL LACTATE 5 MG/ML IJ SOLN: 5.0000 mg | Freq: Four times a day (QID) | INTRAMUSCULAR | Status: DC | PRN

## 2012-05-24 MED ORDER — DIPHENHYDRAMINE HCL 50 MG PO CAPS
50.0000 mg | ORAL_CAPSULE | Freq: Once | ORAL | Status: AC
  Administered 2012-05-24: 50 mg via ORAL
  Filled 2012-05-24: qty 1

## 2012-05-24 MED ORDER — LORAZEPAM 2 MG/ML IJ SOLN: 1.0000 mg | Freq: Four times a day (QID) | INTRAMUSCULAR | Status: DC | PRN

## 2012-05-24 MED ORDER — LORAZEPAM 1 MG PO TABS
1.0000 mg | ORAL_TABLET | Freq: Four times a day (QID) | ORAL | Status: DC | PRN
  Filled 2012-05-24: qty 1

## 2012-05-24 NOTE — Progress Notes (Signed)
[facility] MD Progress Note  05/24/2012 8:00 PM  Diagnosis:  Axis I: Schizophrenia - Paranoid Type.   The patient was seen today and reports the following:   ADL's: Intact.  Sleep: The patient reports to having difficulty initiating and maintaining sleep.  Appetite: The patient reports that her appetite is decreased today.   Mild>(1-10) >Severe  Hopelessness (1-10): 0  Depression (1-10): 0  Anxiety (1-10): 0   Suicidal Ideation: The patient denies any current suicidal ideations today.  Plan: No  Intent: No  Means: No   Homicidal Ideation: The patient denies any homicidal ideations today.  Plan: No  Intent: No.  Means: No   General Appearance/Behavior: The patient was moderately agitated and was minimally cooperative with this provider.  Eye Contact: Good.  Speech: Appropriate in rate and volume with no pressuring noted today.  Motor Behavior: wnl.  Level of Consciousness: Alert and Oriented x 3.  Mental Status: Alert and Oriented x 3.  Mood: No depression reported.  Affect: Moderately agitated.  Anxiety Level: No anxiety reported today.  Thought Process: Psychotic.  Thought Content: The patient appears to be experiencing ongoing auditory hallucinations as well as paranoid delusions. She denies any visual hallucinations.  Perception:. Psychotic.  Judgment: Poor.  Insight: Poor.  Cognition: Oriented to person, place and time.  Sleep:  Number of Hours: 2    Vital Signs:Blood pressure 172/84, pulse 86, temperature 99.5 F (37.5 C), temperature source Oral, resp. rate 17, height 5' 1.5" (1.562 m), weight 69.854 kg (154 lb), SpO2 100.00%.  Current Medications: Current Facility-Administered Medications  Medication Dose Route Frequency Provider Last Rate Last Dose  . acetaminophen (TYLENOL) tablet 650 mg  650 mg Oral Q6H PRN Verne Spurr, PA-C      . alum & mag hydroxide-simeth (MAALOX/MYLANTA) 200-200-20 MG/5ML suspension 30 mL  30 mL Oral Q4H PRN Verne Spurr, PA-C      .  diphenhydrAMINE (BENADRYL) capsule 50 mg  50 mg Oral Once Mickie D. Adams, PA      . divalproex (DEPAKOTE ER) 24 hr tablet 1,000 mg  1,000 mg Oral QHS Curlene Labrum Gayle Martinez, MD      . fluticasone (FLONASE) 50 MCG/ACT nasal spray 2 spray  2 spray Each Nare Daily Curlene Labrum Dontaye Hur, MD   2 spray at 05/23/12 1610  . haloperidol lactate (HALDOL) injection 5 mg  5 mg Intramuscular Q6H PRN Curlene Labrum Tymothy Cass, MD      . irbesartan (AVAPRO) tablet 150 mg  150 mg Oral Daily Curlene Labrum Demetria Iwai, MD   150 mg at 05/23/12 0830  . LORazepam (ATIVAN) tablet 1 mg  1 mg Oral Q6H PRN Ronny Bacon, MD       Or  . LORazepam (ATIVAN) injection 1 mg  1 mg Intramuscular Q6H PRN Curlene Labrum Doil Kamara, MD      . lurasidone (LATUDA) tablet 40 mg  40 mg Oral BID WC Benelli Winther D Vicent Febles, MD      . magnesium hydroxide (MILK OF MAGNESIA) suspension 30 mL  30 mL Oral Daily PRN Verne Spurr, PA-C      . naproxen (NAPROSYN) tablet 500 mg  500 mg Oral BID WC Curlene Labrum Maryelizabeth Eberle, MD   500 mg at 05/23/12 1736  . sertraline (ZOLOFT) tablet 50 mg  50 mg Oral Daily Curlene Labrum Kayslee Furey, MD   50 mg at 05/23/12 0830  . DISCONTD: QUEtiapine (SEROQUEL) tablet 50 mg  50 mg Oral PRN Wonda Cerise, MD       Lab Results:  Results for orders placed during the hospital encounter of 05/21/12 (from the past 48 hour(s))  GLUCOSE, CAPILLARY     Status: Normal   Collection Time   05/22/12  9:39 PM      Component Value Range Comment   Glucose-Capillary 92  70 - 99 mg/dL   GLUCOSE, CAPILLARY     Status: Normal   Collection Time   05/23/12  6:22 AM      Component Value Range Comment   Glucose-Capillary 93  70 - 99 mg/dL   URINALYSIS, ROUTINE W REFLEX MICROSCOPIC     Status: Normal   Collection Time   05/23/12  5:20 PM      Component Value Range Comment   Color, Urine YELLOW  YELLOW    APPearance CLEAR  CLEAR    Specific Gravity, Urine 1.016  1.005 - 1.030    pH 6.5  5.0 - 8.0    Glucose, UA NEGATIVE  NEGATIVE mg/dL    Hgb urine dipstick NEGATIVE  NEGATIVE    Bilirubin  Urine NEGATIVE  NEGATIVE    Ketones, ur NEGATIVE  NEGATIVE mg/dL    Protein, ur NEGATIVE  NEGATIVE mg/dL    Urobilinogen, UA 1.0  0.0 - 1.0 mg/dL    Nitrite NEGATIVE  NEGATIVE    Leukocytes, UA NEGATIVE  NEGATIVE MICROSCOPIC NOT DONE ON URINES WITH NEGATIVE PROTEIN, BLOOD, LEUKOCYTES, NITRITE, OR GLUCOSE <1000 mg/dL.  TSH     Status: Normal   Collection Time   05/23/12  7:08 PM      Component Value Range Comment   TSH 1.487  0.350 - 4.500 uIU/mL   T3, FREE     Status: Normal   Collection Time   05/23/12  7:08 PM      Component Value Range Comment   T3, Free 3.1  2.3 - 4.2 pg/mL   T4, FREE     Status: Normal   Collection Time   05/23/12  7:08 PM      Component Value Range Comment   Free T4 1.45  0.80 - 1.80 ng/dL    Physical Findings: AIMS: Facial and Oral Movements Muscles of Facial Expression: None, normal Lips and Perioral Area: None, normal Jaw: None, normal Tongue: None, normal,Extremity Movements Upper (arms, wrists, hands, fingers): None, normal Lower (legs, knees, ankles, toes): None, normal, Trunk Movements Neck, shoulders, hips: None, normal, Overall Severity Severity of abnormal movements (highest score from questions above): None, normal Incapacitation due to abnormal movements: None, normal Patient's awareness of abnormal movements (rate only patient's report): No Awareness, Dental Status Current problems with teeth and/or dentures?: No Does patient usually wear dentures?: Yes  CIWA:  CIWA-Ar Total: 0  COWS:  COWS Total Score: 0   Review of Systems:  Neurological: The patient denies any headaches today. She denies any seizures or dizziness.  G.I.: The patient denies any constipation today or G.I. Upset.  Musculoskeletal: The patient denies any muscle or skeletal difficulties.   Time was spent today discussing with the patient her current symptoms. The patient reports that her sleep and appetite are both poor. The patient denies any significant feelings of sadness,  anhedonia or depressed mood. The patient denies any suicidal or homicidal ideations as well as any anxiety symptoms. The patient denies any visual hallucinations but appears to be having both auditory hallucinations and paranoid delusions. The patient however states that she will not take medications and "wants to see how I do with no medications."  Treatment Plan Summary:  1. Daily  contact with patient to assess and evaluate symptoms and progress in treatment.  2. Medication management  3. The patient will deny suicidal ideations or homicidal ideations for 48 hours prior to discharge and have a depression and anxiety rating of 3 or less. The patient will also deny any auditory or visual hallucinations or delusional thinking.  4. The patient will deny any symptoms of substance withdrawal at time of discharge.   Plan:  1. Will continue the medication Depakote ER at 1000 mgs po qhs for mood stabilization.  2. Will continue the medication Latuda at 40 mgs po BID-WC for psychosis.  3. Will continue the medication Zoloft at 50 mgs po q am for depression.  4. Will start Haldol 5 mgs IM plus Ativan 1 mg po or IM q 6 hours - prn for agitation. 5. Laboratory studies reviewed.  6. Will continue to monitor.  7. Will place the patient on an IVC and will request a 2nd opinion for forced medications if the patient continues to refuse medications.  Dierre Crevier 05/24/2012, 8:00 PM

## 2012-05-24 NOTE — Discharge Planning (Signed)
05/24/2012  Pt did not attend d/c planning group on this date. SW met with pt individually at this time.   SW met with pt in treatment team.  Pt. Refused medications.  SW to contact pt niece Gordy Councilman [phone] for additional information.  Left message for her to call back.  Clarice Pole, LCASA 05/24/2012, 3:33 PM

## 2012-05-24 NOTE — Progress Notes (Signed)
Pts family member here and is concerned about pt not taking her meds. She requested we give her whatever we can while she is here so pt will take what she needs. Pt adamant writer is an Engineer, building services" and refuses to take meds from him. Discussed with Mallie Darting, PA and gave verbal order to give BP meds and psych meds from the day now. Discussed with Louie Bun, RN and she agreed to give pt her meds in the presence of family. Pt reluctantly agreed to take meds from [facility]. She is too psychotic to formally assess. She remains safe on the unit with Q15 minute observation. Wil continue current POC.

## 2012-05-24 NOTE — Progress Notes (Signed)
[facility] Group Notes:  (Counselor/Nursing/MHT/Case Management/Adjunct)  05/24/2012  10:30  AM  Type of Therapy: Group Therapy   Participation Level:  Poor, Disruptive   Summary of Progress/Problems: Pt came into group, sat down and begin rapidly speaking tangentially, ruminating about not being retarded and knowing how to count cards repeatedly.  Therapist asked Tech to escort her to her rum as her was behavior was disruptive.  Pt left willingly and without questions.   Marni Griffon C 05/24/2012  10:30 AM

## 2012-05-24 NOTE — Progress Notes (Signed)
Psychoeducational Group Note  Date:  05/24/2012 Time:  1100  Group Topic/Focus:  Recovery Goals:   The focus of this group is to identify appropriate goals for recovery and establish a plan to achieve them.  Participation Level: Did Not Attend  Participation Quality:  Not Applicable  Affect:  Not Applicable  Cognitive:  Not Applicable  Insight:  Not Applicable  Engagement in Group: Not Applicable  Additional Comments:  Pt refused to attend group this morning.  Akane Tessier E 05/24/2012, 2:44 PM

## 2012-05-24 NOTE — Progress Notes (Addendum)
D:  Patient agitated this morning.  Refused all medications.  Speech loud and pressured, tangential, and intrusive.  Sat in the chair in her room spitting on a towel that was on the floor in front of her.  Declines offers of food.   A:  Encouraged patient to take her scheduled medications.  She was educated about our forced medication process.  Offered food and fluids.  R:  Patient is not receptive to learning or understanding at this time.  Remains safe on the unit.  Addendum:  B/P was high this morning and patient refused all of her medications.  B/P was rechecked at 13:15 and remains elevated.  She was offered antihypertensive medication again and she refuses to take anything.  Dr. Allena Katz is aware of the situation.

## 2012-05-25 DIAGNOSIS — F200 Paranoid schizophrenia: Secondary | ICD-10-CM | POA: Diagnosis not present

## 2012-05-25 NOTE — Progress Notes (Signed)
05/25/2012         Time: 0945      Group Topic/Focus: The focus of this group is on enhancing the patient's understanding of leisure, barriers to leisure, and the importance of engaging in positive leisure activities upon discharge for improved total health.  Participation Level: Did not attend  Participation Quality: Not Applicable  Affect: Not Applicable  Cognitive: Not Applicable   Additional Comments: Patient refused group.   Zeki Bedrosian 05/25/2012 12:12 PM

## 2012-05-25 NOTE — Progress Notes (Signed)
D: Pt denies SI/HI. Pt states that she is experiencing AH. Pt states that she has not been able to sleep and has had a low appetite. Affect is flat. Pt is guarded and seclusive. Pt denies any depression and hopelessness. Pt rates her anxiety as a 3. A: Support and encouragement offered to pt. Continue Q 15 min checks for safety. R: Pt receptive. Pt remains safe on unit.
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
## 2012-05-25 NOTE — Progress Notes (Signed)
[facility] Group Notes:  (Counselor/Nursing/MHT/Case Management/Adjunct)  05/25/12 2:30 PM  Type of Therapy:  Mental [employer] Association Support  Participation Level:  Did not attend  Christen Butter 05/25/2012, 2:30 PM

## 2012-05-25 NOTE — Progress Notes (Signed)
Psychoeducational Group Note  Date:  05/25/2012 Time:  2000  Group Topic/Focus:  Wrap-Up Group:   The focus of this group is to help patients review their daily goal of treatment and discuss progress on daily workbooks.  Participation Level: Did Not Attend  Participation Quality:  Not Applicable  Affect:  Not Applicable  Cognitive:  Not Applicable  Insight:  Not Applicable  Engagement in Group: Not Applicable  Additional Comments:    Flonnie Hailstone 05/25/2012, 8:46 PM

## 2012-05-25 NOTE — Progress Notes (Signed)
[facility] MD Progress Note  05/25/2012 2:41 PM  Diagnosis:  Axis I: Schizophrenia - Paranoid Type.   The patient was seen today and reports the following:   ADL's: Intact.  Sleep: The patient reports to have continuing difficulty initiating and maintaining sleep.  Appetite: The patient reports that her appetite is decreased today.   Mild>(1-10) >Severe  Hopelessness (1-10): 0  Depression (1-10): 0  Anxiety (1-10): 3-4   Suicidal Ideation: The patient denies any current suicidal ideations today.  Plan: No  Intent: No  Means: No   Homicidal Ideation: The patient denies any homicidal ideations today.  Plan: No  Intent: No.  Means: No   General Appearance/Behavior: The patient was much less agitated today and was more cooperative with this provider.  Eye Contact: Good.  Speech: Appropriate in rate and volume with no pressuring noted today.  Motor Behavior: wnl.  Level of Consciousness: Alert and Oriented x 3.  Mental Status: Alert and Oriented x 3.  Mood: No depression reported.  Affect: Moderately constricted.  Anxiety Level: No anxiety reported today.  Thought Process: Psychotic .  Thought Content: The patient appears to be experiencing ongoing auditory hallucinations as well as paranoid delusions. She denies any visual hallucinations.  Perception:. Psychotic.  Judgment: Poor.  Insight: Poor.  Cognition: Oriented to person, place and time.  Sleep:  Number of Hours: 4.75    Vital Signs:Blood pressure 171/101, pulse 84, temperature 98.4 F (36.9 C), temperature source Oral, resp. rate 17, height 5' 1.5" (1.562 m), weight 69.854 kg (154 lb), SpO2 100.00%.  Current Medications: Current Facility-Administered Medications  Medication Dose Route Frequency Provider Last Rate Last Dose  . acetaminophen (TYLENOL) tablet 650 mg  650 mg Oral Q6H PRN Verne Spurr, PA-C      . alum & mag hydroxide-simeth (MAALOX/MYLANTA) 200-200-20 MG/5ML suspension 30 mL  30 mL Oral Q4H PRN Verne Spurr, PA-C      . diphenhydrAMINE (BENADRYL) capsule 50 mg  50 mg Oral Once Mickie D. Adams, PA   50 mg at 05/24/12 2017  . divalproex (DEPAKOTE ER) 24 hr tablet 1,000 mg  1,000 mg Oral QHS Curlene Labrum Ethlyn Alto, MD   1,000 mg at 05/24/12 2017  . fluticasone (FLONASE) 50 MCG/ACT nasal spray 2 spray  2 spray Each Nare Daily Curlene Labrum Solina Heron, MD   2 spray at 05/25/12 0838  . haloperidol lactate (HALDOL) injection 5 mg  5 mg Intramuscular Q6H PRN Curlene Labrum Careen Mauch, MD      . irbesartan (AVAPRO) tablet 150 mg  150 mg Oral Daily Curlene Labrum Woodford Strege, MD   150 mg at 05/25/12 0837  . LORazepam (ATIVAN) tablet 1 mg  1 mg Oral Q6H PRN Ronny Bacon, MD       Or  . LORazepam (ATIVAN) injection 1 mg  1 mg Intramuscular Q6H PRN Curlene Labrum Tremond Shimabukuro, MD      . lurasidone (LATUDA) tablet 40 mg  40 mg Oral BID WC Curlene Labrum Karalynn Cottone, MD   40 mg at 05/25/12 1610  . magnesium hydroxide (MILK OF MAGNESIA) suspension 30 mL  30 mL Oral Daily PRN Verne Spurr, PA-C      . naproxen (NAPROSYN) tablet 500 mg  500 mg Oral BID WC Curlene Labrum Evalisse Prajapati, MD   500 mg at 05/25/12 9604  . sertraline (ZOLOFT) tablet 50 mg  50 mg Oral Daily Curlene Labrum Falon Flinchum, MD   50 mg at 05/25/12 0837  . DISCONTD: QUEtiapine (SEROQUEL) tablet 50 mg  50 mg  Oral PRN Wonda Cerise, MD       Lab Results:  Results for orders placed during the hospital encounter of 05/21/12 (from the past 48 hour(s))  URINALYSIS, ROUTINE W REFLEX MICROSCOPIC     Status: Normal   Collection Time   05/23/12  5:20 PM      Component Value Range Comment   Color, Urine YELLOW  YELLOW    APPearance CLEAR  CLEAR    Specific Gravity, Urine 1.016  1.005 - 1.030    pH 6.5  5.0 - 8.0    Glucose, UA NEGATIVE  NEGATIVE mg/dL    Hgb urine dipstick NEGATIVE  NEGATIVE    Bilirubin Urine NEGATIVE  NEGATIVE    Ketones, ur NEGATIVE  NEGATIVE mg/dL    Protein, ur NEGATIVE  NEGATIVE mg/dL    Urobilinogen, UA 1.0  0.0 - 1.0 mg/dL    Nitrite NEGATIVE  NEGATIVE    Leukocytes, UA NEGATIVE  NEGATIVE  MICROSCOPIC NOT DONE ON URINES WITH NEGATIVE PROTEIN, BLOOD, LEUKOCYTES, NITRITE, OR GLUCOSE <1000 mg/dL.  TSH     Status: Normal   Collection Time   05/23/12  7:08 PM      Component Value Range Comment   TSH 1.487  0.350 - 4.500 uIU/mL   T3, FREE     Status: Normal   Collection Time   05/23/12  7:08 PM      Component Value Range Comment   T3, Free 3.1  2.3 - 4.2 pg/mL   T4, FREE     Status: Normal   Collection Time   05/23/12  7:08 PM      Component Value Range Comment   Free T4 1.45  0.80 - 1.80 ng/dL    Physical Findings: AIMS: Facial and Oral Movements Muscles of Facial Expression: None, normal Lips and Perioral Area: None, normal Jaw: None, normal Tongue: None, normal,Extremity Movements Upper (arms, wrists, hands, fingers): None, normal Lower (legs, knees, ankles, toes): None, normal, Trunk Movements Neck, shoulders, hips: None, normal, Overall Severity Severity of abnormal movements (highest score from questions above): None, normal Incapacitation due to abnormal movements: None, normal Patient's awareness of abnormal movements (rate only patient's report): No Awareness, Dental Status Current problems with teeth and/or dentures?: No Does patient usually wear dentures?: No  CIWA:  CIWA-Ar Total: 0  COWS:  COWS Total Score: 0   Review of Systems:  Neurological: The patient denies any headaches today. She denies any seizures or dizziness.  G.I.: The patient denies any constipation today or G.I. Upset.  Musculoskeletal: The patient denies any muscle or skeletal difficulties.   Time was spent today discussing with the patient her current symptoms. The patient reports that her sleep and appetite both remain poor. The patient denies any significant feelings of sadness, anhedonia or depressed mood. The patient also denies any suicidal or homicidal ideations as well as any anxiety symptoms. The patient denies any visual hallucinations but appears to be having both auditory  hallucinations and paranoid delusions. The patient refused medications yesterday but after a visit from her niece, the patient took her medications both last night and this morning.  She denies any medication related side effects today.  Treatment Plan Summary:  1. Daily contact with patient to assess and evaluate symptoms and progress in treatment.  2. Medication management  3. The patient will deny suicidal ideations or homicidal ideations for 48 hours prior to discharge and have a depression and anxiety rating of 3 or less. The patient will also deny  any auditory or visual hallucinations or delusional thinking.  4. The patient will deny any symptoms of substance withdrawal at time of discharge.   Plan:  1. Will continue the medication Depakote ER at 1000 mgs po qhs for mood stabilization.  2. Will continue the medication Latuda at 40 mgs po BID-WC for psychosis.  3. Will continue the medication Zoloft at 50 mgs po q am for depression.  4. Will continue the medication Haldol 5 mgs IM plus Ativan 1 mg po or IM q 6 hours - prn for agitation.  5. Laboratory studies reviewed.  6. Will order a repeat Serum Depakote Level for this evening. 7. Will continue to monitor.   Kinslee Dalpe 05/25/2012, 2:41 PM

## 2012-05-25 NOTE — Progress Notes (Signed)
Pt refused to take 5 pm meds. States that she takes meds twice a day. Pt agreed to take them at 2200.
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
## 2012-05-25 NOTE — Progress Notes (Signed)
Patient ID: Kaylee Kelley, female   DOB: 1946/06/24, 66 y.o.   MRN: [id number] D: Pt. Lying in bed, reports AVH "sometimes" "just trying to make it" A: Staff will monitor q60min for for safety. Pt. Encouraged to go to group. R: Pt. Is safe on the unit, "I rest", Pt. Did not attend group

## 2012-05-26 DIAGNOSIS — F200 Paranoid schizophrenia: Secondary | ICD-10-CM | POA: Diagnosis not present

## 2012-05-26 LAB — VALPROIC ACID LEVEL: Valproic Acid Lvl: 54.7 ug/mL (ref 50.0–100.0)

## 2012-05-26 NOTE — Progress Notes (Signed)
Psychoeducational Group Note  Date:  05/26/2012 Time: 1000  Group Topic/Focus:  Building Self Esteem:   The Focus of this group is helping patients become aware of the effects of self-esteem on their lives, the things they and others do that enhance or undermine their self-esteem, seeing the relationship between their level of self-esteem and the choices they make and learning ways to enhance self-esteem.  Participation Level: Did Not Attend  Participation Quality:  Not Applicable  Affect:  Not Applicable  Cognitive:  Not Applicable  Insight:  Not Applicable  Engagement in Group: Not Applicable  Additional Comments:  Pt refused to attend group this morning  Oceane Fosse E 05/26/2012, 11:58 AM

## 2012-05-26 NOTE — Progress Notes (Signed)
[facility] Group Notes:  (Counselor/Nursing/MHT/Case Management/Adjunct)  05/26/2012  2:15 PM  Type of Therapy: Group Therapy   Participation Level: Pt did not attend     Christen Butter 05/26/2012, 2:15 PM

## 2012-05-26 NOTE — Discharge Planning (Signed)
05/26/2012  Pt did not attend d/c planning group on this date. SW spoke with pt. niece at this time. Pt. Niece Gordy Councilman provided additional information to support clinical assessment information needed.  SW provided feedback on the process for becoming a representative payee for the pt.  SW will continue to monitor for referrals.    Clarice Pole, LCASA 05/26/2012, 3:07 PM

## 2012-05-26 NOTE — Progress Notes (Signed)
Pt refused 1700 pm medications, pt states that she will take them with her hs meds, reschedule dose for 8pm, MD made aware.

## 2012-05-26 NOTE — Progress Notes (Signed)
[facility] Group Notes:  (Counselor/Nursing/MHT/Case Management/Adjunct)  05/26/2012  10:30  AM  Type of Therapy: Group Therapy   Participation Level:  Did not attend.  Asleep      Kaylee Kelley 05/26/2012  10:30 AM

## 2012-05-26 NOTE — Progress Notes (Signed)
Pt unable to fill out self inventory, pt having extreme disorganized thinking, upon assessment pt in bed isolating self from group, pt urinated on floor beside bed, pt unable to explain what happened but did admit that it was urine, pt denies SI/HI/AVH, pt agreed to take am medications, refused breakfast, offered pt some graham crackers and water, pt ate snack, pt put on brief for incontinence.
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
## 2012-05-26 NOTE — Progress Notes (Signed)
[facility] MD Progress Note                                         [id number]   Kaylee Kelley  1946/06/18   05/26/2012 Diagnosis:  Axis I: Schizophrenia - Paranoid Type.  The patient was seen today and reports the following:  ADL's: Intact.  Sleep: The patient reports to having difficulty initiating and maintaining sleep.  Appetite: The patient reports that her appetite is decreased today.  Mild>(1-10) >Severe  Hopelessness (1-10): 0  Depression (1-10): 0  Anxiety (1-10): 0  Suicidal Ideation: The patient denies any current suicidal ideations today.  Plan: No  Intent: No  Means: No  Homicidal Ideation: The patient denies any homicidal ideations today.  Plan: No  Intent: No.  Means: No  General Appearance/Behavior: The patient was moderately agitated and was cooperative with this provider.  Eye Contact: Good.  Speech: rapid in rate and volume with less pressuring noted today. Easier to understand. Motor Behavior: wnl.  Level of Consciousness: Alert and Oriented x 3.  Mental Status: Alert and Oriented x 3.  Mood: No depression reported.  Affect: congruent Anxiety Level: No anxiety reported today.  Thought Process: Psychotic.  Thought Content: The patient denies any auditory hallucinations as well as paranoid delusions. She denies any visual hallucinations.  Perception:. Psychotic.  Judgment: Poor.  Insight: Poor.  Cognition: Oriented to person, place and time.  Sleep: Number of Hours: 2   Vital Signs:Blood pressure 172/84, pulse 86, temperature 99.5 F (37.5 C), temperature source Oral, resp. rate 17, height 5' 1.5" (1.562 m), weight 69.854 kg (154 lb), SpO2 100.00%.  Current Medications:  Current Facility-Administered Medications   Medication  Dose  Route  Frequency  Provider  Last Rate  Last Dose   .  acetaminophen (TYLENOL) tablet 650 mg  650 mg  Oral  Q6H PRN  Verne Spurr, PA-C     .  alum & mag hydroxide-simeth (MAALOX/MYLANTA) 200-200-20 MG/5ML suspension 30 mL  30 mL  Oral   Q4H PRN  Verne Spurr, PA-C     .  diphenhydrAMINE (BENADRYL) capsule 50 mg  50 mg  Oral  Once  Mickie D. Adams, PA     .  divalproex (DEPAKOTE ER) 24 hr tablet 1,000 mg  1,000 mg  Oral  QHS  Curlene Labrum Readling, MD     .  fluticasone (FLONASE) 50 MCG/ACT nasal spray 2 spray  2 spray  Each Nare  Daily  Curlene Labrum Readling, MD   2 spray at 05/23/12 5621   .  haloperidol lactate (HALDOL) injection 5 mg  5 mg  Intramuscular  Q6H PRN  Curlene Labrum Readling, MD     .  irbesartan (AVAPRO) tablet 150 mg  150 mg  Oral  Daily  Curlene Labrum Readling, MD   150 mg at 05/23/12 0830   .  LORazepam (ATIVAN) tablet 1 mg  1 mg  Oral  Q6H PRN  Ronny Bacon, MD      Or   .  LORazepam (ATIVAN) injection 1 mg  1 mg  Intramuscular  Q6H PRN  Curlene Labrum Readling, MD     .  lurasidone (LATUDA) tablet 40 mg  40 mg  Oral  BID WC  Randy D Readling, MD     .  magnesium hydroxide (MILK OF MAGNESIA) suspension 30 mL  30 mL  Oral  Daily PRN  Verne Spurr, PA-C     .  naproxen (NAPROSYN) tablet 500 mg  500 mg  Oral  BID WC  Curlene Labrum Readling, MD   500 mg at 05/23/12 1736   .  sertraline (ZOLOFT) tablet 50 mg  50 mg  Oral  Daily  Curlene Labrum Readling, MD   50 mg at 05/23/12 0830   .  DISCONTD: QUEtiapine (SEROQUEL) tablet 50 mg  50 mg  Oral  PRN  Wonda Cerise, MD     Lab Results:  Results for orders placed during the hospital encounter of 05/21/12 (from the past 48 hour(s))   GLUCOSE, CAPILLARY Status: Normal    Collection Time    05/22/12 9:39 PM   Component  Value  Range  Comment    Glucose-Capillary  92  70 - 99 mg/dL    GLUCOSE, CAPILLARY Status: Normal    Collection Time    05/23/12 6:22 AM   Component  Value  Range  Comment    Glucose-Capillary  93  70 - 99 mg/dL    URINALYSIS, ROUTINE W REFLEX MICROSCOPIC Status: Normal    Collection Time    05/23/12 5:20 PM   Component  Value  Range  Comment    Color, Urine  YELLOW  YELLOW     APPearance  CLEAR  CLEAR     Specific Gravity, Urine  1.016  1.005 - 1.030     pH  6.5  5.0 - 8.0      Glucose, UA  NEGATIVE  NEGATIVE mg/dL     Hgb urine dipstick  NEGATIVE  NEGATIVE     Bilirubin Urine  NEGATIVE  NEGATIVE     Ketones, ur  NEGATIVE  NEGATIVE mg/dL     Protein, ur  NEGATIVE  NEGATIVE mg/dL     Urobilinogen, UA  1.0  0.0 - 1.0 mg/dL     Nitrite  NEGATIVE  NEGATIVE     Leukocytes, UA  NEGATIVE  NEGATIVE  MICROSCOPIC NOT DONE ON URINES WITH NEGATIVE PROTEIN, BLOOD, LEUKOCYTES, NITRITE, OR GLUCOSE <1000 mg/dL.   TSH Status: Normal    Collection Time    05/23/12 7:08 PM   Component  Value  Range  Comment    TSH  1.487  0.350 - 4.500 uIU/mL    T3, FREE Status: Normal    Collection Time    05/23/12 7:08 PM   Component  Value  Range  Comment    T3, Free  3.1  2.3 - 4.2 pg/mL    T4, FREE Status: Normal    Collection Time    05/23/12 7:08 PM   Component  Value  Range  Comment    Free T4  1.45  0.80 - 1.80 ng/dL     Physical Findings:  AIMS: Facial and Oral Movements  Muscles of Facial Expression: None, normal  Lips and Perioral Area: None, normal  Jaw: None, normal  Tongue: None, normal,Extremity Movements  Upper (arms, wrists, hands, fingers): None, normal  Lower (legs, knees, ankles, toes): None, normal, Trunk Movements  Neck, shoulders, hips: None, normal, Overall Severity  Severity of abnormal movements (highest score from questions above): None, normal  Incapacitation due to abnormal movements: None, normal  Patient's awareness of abnormal movements (rate only patient's report): No Awareness, Dental Status  Current problems with teeth and/or dentures?: No  Does patient usually wear dentures?: Yes  CIWA: CIWA-Ar Total: 0  COWS: COWS Total Score: 0  Review of Systems:  Neurological: The  patient denies any headaches today. She denies any seizures or dizziness.  G.I.: The patient denies any constipation today or G.I. Upset.  Musculoskeletal: The patient denies any muscle or skeletal difficulties.     Treatment Plan Summary:  1. Daily contact with patient to assess and  evaluate symptoms and progress in treatment.  2. Medication management  3. The patient will deny suicidal ideations or homicidal ideations for 48 hours prior to discharge and have a depression and anxiety rating of 3 or less. The patient will also deny any auditory or visual hallucinations or delusional thinking.  4. The patient will deny any symptoms of substance withdrawal at time of discharge.  Plan:  1. Will continue the medication Depakote ER at 1000 mgs po qhs for mood stabilization.  2. Will continue the medication Latuda at 40 mgs po BID-WC for psychosis.  3. Will continue the medication Zoloft at 50 mgs po q am for depression.  4. Will start Haldol 5 mgs IM plus Ativan 1 mg po or IM q 6 hours - prn for agitation.  5. Laboratory studies reviewed.  6. Will continue to monitor.  7. Will place the patient on an IVC and will request a 2nd opinion for forced medications if the patient continues to refuse medications Lloyd Huger T. Kaizer Dissinger [facility] 05/26/2012

## 2012-05-27 DIAGNOSIS — F200 Paranoid schizophrenia: Secondary | ICD-10-CM | POA: Diagnosis not present

## 2012-05-27 MED ORDER — LURASIDONE HCL 40 MG PO TABS
40.0000 mg | ORAL_TABLET | ORAL | Status: DC
  Administered 2012-05-28 – 2012-05-30 (×5): 40 mg via ORAL
  Filled 2012-05-27 (×9): qty 1

## 2012-05-27 MED ORDER — DIVALPROEX SODIUM ER 500 MG PO TB24
1500.0000 mg | ORAL_TABLET | Freq: Every day | ORAL | Status: DC
  Administered 2012-05-27 – 2012-06-07 (×12): 1500 mg via ORAL
  Filled 2012-05-27 (×7): qty 3
  Filled 2012-05-27: qty 42
  Filled 2012-05-27 (×8): qty 3
  Filled 2012-05-27 (×2): qty 42
  Filled 2012-05-27: qty 1
  Filled 2012-05-27 (×2): qty 3

## 2012-05-27 MED ORDER — TRAZODONE HCL 100 MG PO TABS
100.0000 mg | ORAL_TABLET | Freq: Every evening | ORAL | Status: DC | PRN
  Administered 2012-06-07: 100 mg via ORAL
  Filled 2012-05-27 (×3): qty 1

## 2012-05-27 NOTE — Progress Notes (Signed)
[facility] MD Progress Note  05/27/2012 9:31 PM  Diagnosis:  Axis I: Schizophrenia - Paranoid Type.   The patient was seen today and reports the following:   ADL's: Intact.  Sleep: The patient reports to have continuing difficulty initiating and maintaining sleep.  Appetite: The patient reports that her appetite is good today.   Mild>(1-10) >Severe  Hopelessness (1-10): 0  Depression (1-10): 0  Anxiety (1-10): 0   Suicidal Ideation: The patient denies any current suicidal ideations today.  Plan: No  Intent: No  Means: No   Homicidal Ideation: The patient denies any homicidal ideations today.  Plan: No  Intent: No.  Means: No   General Appearance/Behavior: The patient was much less agitated today and again was more cooperative with this provider.  Eye Contact: Good.  Speech: Appropriate in rate and volume with no pressuring noted today.  Motor Behavior: wnl.  Level of Consciousness: Alert and Oriented x 3.  Mental Status: Alert and Oriented x 3.  Mood: No depression reported.  Affect: Moderately constricted.  Anxiety Level: No anxiety reported today.  Thought Process: Psychotic .  Thought Content: The patient reports that she is experiencing auditory hallucinations today as well as some paranoid delusions. She denies any visual hallucinations.  Perception:. Psychotic.  Judgment: Fair to Poor.  Insight: Fair to Poor.  Cognition: Oriented to person, place and time.  Sleep:  Number of Hours: 5    Vital Signs:Blood pressure 133/74, pulse 84, temperature 98 F (36.7 C), temperature source Oral, resp. rate 16, height 5' 1.5" (1.562 m), weight 69.854 kg (154 lb), SpO2 100.00%.  Current Medications: Current Facility-Administered Medications  Medication Dose Route Frequency Provider Last Rate Last Dose  . acetaminophen (TYLENOL) tablet 650 mg  650 mg Oral Q6H PRN Verne Spurr, PA-C      . alum & mag hydroxide-simeth (MAALOX/MYLANTA) 200-200-20 MG/5ML suspension 30 mL  30 mL Oral Q4H  PRN Verne Spurr, PA-C      . divalproex (DEPAKOTE ER) 24 hr tablet 1,500 mg  1,500 mg Oral QHS Curlene Labrum Larkyn Greenberger, MD   1,500 mg at 05/27/12 2104  . fluticasone (FLONASE) 50 MCG/ACT nasal spray 2 spray  2 spray Each Nare Daily Curlene Labrum Argil Mahl, MD   2 spray at 05/27/12 0817  . haloperidol lactate (HALDOL) injection 5 mg  5 mg Intramuscular Q6H PRN Curlene Labrum Shlonda Dolloff, MD      . irbesartan (AVAPRO) tablet 150 mg  150 mg Oral Daily Curlene Labrum Daison Braxton, MD   150 mg at 05/27/12 0817  . LORazepam (ATIVAN) tablet 1 mg  1 mg Oral Q6H PRN Ronny Bacon, MD       Or  . LORazepam (ATIVAN) injection 1 mg  1 mg Intramuscular Q6H PRN Curlene Labrum Elford Evilsizer, MD      . lurasidone (LATUDA) tablet 40 mg  40 mg Oral BH-qamhs Amaka Gluth D Greene Diodato, MD      . magnesium hydroxide (MILK OF MAGNESIA) suspension 30 mL  30 mL Oral Daily PRN Verne Spurr, PA-C      . naproxen (NAPROSYN) tablet 500 mg  500 mg Oral BID WC Curlene Labrum Anais Denslow, MD   500 mg at 05/27/12 0817  . sertraline (ZOLOFT) tablet 50 mg  50 mg Oral Daily Curlene Labrum Cayle Cordoba, MD   50 mg at 05/27/12 0817  . DISCONTD: divalproex (DEPAKOTE ER) 24 hr tablet 1,000 mg  1,000 mg Oral QHS Curlene Labrum Sharice Harriss, MD   1,000 mg at 05/26/12 2014  . DISCONTD: lurasidone (  LATUDA) tablet 40 mg  40 mg Oral BID WC Curlene Labrum Fredna Stricker, MD   40 mg at 05/27/12 2108   Lab Results: No results found for this or any previous visit (from the past 48 hour(s)).  Physical Findings: AIMS: Facial and Oral Movements Muscles of Facial Expression: None, normal Lips and Perioral Area: None, normal Jaw: None, normal Tongue: None, normal,Extremity Movements Upper (arms, wrists, hands, fingers): None, normal Lower (legs, knees, ankles, toes): None, normal, Trunk Movements Neck, shoulders, hips: None, normal, Overall Severity Severity of abnormal movements (highest score from questions above): None, normal Incapacitation due to abnormal movements: None, normal Patient's awareness of abnormal movements (rate  only patient's report): No Awareness, Dental Status Current problems with teeth and/or dentures?: Yes Does patient usually wear dentures?: Yes  CIWA:  CIWA-Ar Total: 0  COWS:  COWS Total Score: 0   Review of Systems:  Neurological: The patient denies any headaches today. She denies any seizures or dizziness.  G.I.: The patient denies any constipation today or G.I. Upset.  Musculoskeletal: The patient denies any muscle or skeletal difficulties.   Time was spent today discussing with the patient her current symptoms. The patient reports that her sleep remains decreased but she reports a good appetite today. The patient denies any significant feelings of sadness, anhedonia or depressed mood. The patient also denies any suicidal or homicidal ideations as well as any anxiety symptoms. The patient denies any visual hallucinations but does report both auditory hallucinations and paranoid delusions today. She is now taking her medications but asked that the medication Latuda be changed to q am and hs instead of with meals.  She denies any medication related side effects today.   Treatment Plan Summary:  1. Daily contact with patient to assess and evaluate symptoms and progress in treatment.  2. Medication management  3. The patient will deny suicidal ideations or homicidal ideations for 48 hours prior to discharge and have a depression and anxiety rating of 3 or less. The patient will also deny any auditory or visual hallucinations or delusional thinking.  4. The patient will deny any symptoms of substance withdrawal at time of discharge.   Plan:  1. Will increase the medication Depakote ER to 1500 mgs po qhs for further mood stabilization.  2. Will continue the medication Latuda but will change the dosing time at the patient's request to 40 mgs po q am and hs for psychosis.  3. Will continue the medication Zoloft at 50 mgs po q am for depression.  4. Will continue the medication Haldol 5 mgs IM plus  Ativan 1 mg po or IM q 6 hours - prn for agitation.  5. Will start the medication Trazodone at 100 mgs po qhs - prn for sleep. 6. Laboratory studies reviewed.  7. Will continue to monitor.   Theodis Kinsel 05/27/2012, 9:31 PM

## 2012-05-27 NOTE — Progress Notes (Signed)
Psychoeducational Group Note  Date:  05/27/2012 Time:  2000  Group Topic/Focus:  Goals Group:   The focus of this group is to help patients establish daily goals to achieve during treatment and discuss how the patient can incorporate goal setting into their daily lives to aide in recovery.  Participation Level:  Did Not Attend  Participation Quality:  Patient did not attend  Affect:  Irritable  Cognitive:  Delusional and Hallucinating  Insight:  None  Engagement in Group:  None  Additional Comments:  Patient remained in room. Patient appeared to be cognitively preoccupied, but stated that she did not want to come to group.  Lyndee Hensen 05/27/2012, 10:37 PM

## 2012-05-27 NOTE — Progress Notes (Signed)
[facility] Group Notes:  (Counselor/Nursing/MHT/Case Management/Adjunct)  05/27/2012  2:30  PM  Type of Therapy: Group Therapy   Participation Level:  Did Not Attend      Christen Butter 05/27/2012  2:30 PM

## 2012-05-27 NOTE — Progress Notes (Signed)
05/27/2012         Time: 0930      Group Topic/Focus: The focus of the group is on enhancing the patients' ability to cope with stressors by understanding what coping is, why it is important, the negative effects of stress and developing healthier coping skills. Patients practice [place] and discuss how exercise can be used as a healthy coping strategy.  Participation Level: Did Not Attend  Participation Quality: Not Applicable  Affect: Not Applicable  Cognitive: Not Applicable   Additional Comments: Patient refused group.  Akiva Brassfield 05/27/2012 10:14 AM

## 2012-05-27 NOTE — Progress Notes (Signed)
7a-7p D-Patient visible on the unit today. Denies SI. Attends groups but walks out early. Denies feeling depressed. Slept fair but appetite is improving.  A-On several occasions patient heard screaming loudly or forcefully coughing up phlegm. Patient was asked to return to room as other patients were bothered by this behavior. The patient's speech is difficult to understand at times. Was able to complete her self inventory when asked by Clinical research associate.  R-Patient remains safe on the unit. Continue to monitor closely and monitor provide support.

## 2012-05-27 NOTE — Discharge Planning (Signed)
05/27/2012   Pt did not attend d/c planning group on this date. SW met with pt individually at this time.   Met with pt. In treatment Pt. Appears to be better.  Pt. Agreed to take medications as prescribed and go to her appts. At [facility] upon discharge.  Clarice Pole, LCASA 05/27/2012, 5:46 PM

## 2012-05-27 NOTE — Progress Notes (Signed)
Patient ID: Kaylee Kelley, female   DOB: September 10, 1946, 66 y.o.   MRN: [id number] D: Pt. In room with niece, takes meds with no problems while she is here. Pt. Calm sitting on side of med agrees to go to bathroom an sleep tonight with niece's encouragement. A: Pt. Encouraged to go to [employer], staff will monitor q5min for safety. R: Pt. Remains safe on the unit. Pt. Goes to [place].

## 2012-05-28 NOTE — Progress Notes (Signed)
Psychoeducational Group Note  Date:  05/28/2012 Time:1000am  Group Topic/Focus:  Identifying Needs:   The focus of this group is to help patients identify their personal needs that have been historically problematic and identify healthy behaviors to address their needs.  Participation Level:  Did Not Attend  Participation Quality:    Affect:   Additional Comments:    Kaylee Kelley 05/28/2012,11:01 AM

## 2012-05-28 NOTE — Progress Notes (Signed)
Patient ID: Kaylee Kelley, female   DOB: 12-20-1945, 66 y.o.   MRN: [id number]  Pt awake, at times, spitting on floor and in bags located throughout her room. Pt making loud vomiting sounds while spitting. No emesis noted, only saliva seen. Pt difficult to redirect. No s/s of distress noted.
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
## 2012-05-28 NOTE — Progress Notes (Signed)
Patient ID: Kaylee Kelley, female   DOB: [birth year], 66 y.o.   MRN: [id number] 05-28-12@ 1323 nursing shift note: D: pt has been isolative on the unit and not coming to groups. A: rn attempted to stimulate the pt to participate. R: she remains in her room sleeping and not participating.

## 2012-05-28 NOTE — Progress Notes (Signed)
  Kaylee Kelley is a 66 y.o. female [id number] 20-Mar-1946  05/21/2012 Principal Problem:  *Schizophrenia, paranoid type   Mental Status: Mood easily escalates says the TV and Radio talk to her.   Subjective/Objective: Woke her and she screamed -I don't know who was more upset her or me - she quickly settled down but is still very symptomatic from [facility].   Filed Vitals:   05/28/12 0701  BP: 150/83  Pulse: 88  Temp:   Resp:     Lab Results:   BMET    Component Value Date/Time   NA 141 05/20/2012 2315   K 4.3 05/21/2012 1244   CL 103 05/20/2012 2315   CO2 29 05/20/2012 2315   GLUCOSE 144* 05/20/2012 2315   BUN 9 05/20/2012 2315   CREATININE 0.92 05/20/2012 2315   CALCIUM 9.3 05/20/2012 2315   GFRNONAA 65* 05/20/2012 2315   GFRAA 75* 05/20/2012 2315    Medications:  Scheduled:     . divalproex  1,500 mg Oral QHS  . fluticasone  2 spray Each Nare Daily  . irbesartan  150 mg Oral Daily  . lurasidone  40 mg Oral BH-qamhs  . naproxen  500 mg Oral BID WC  . sertraline  50 mg Oral Daily  . DISCONTD: divalproex  1,000 mg Oral QHS  . DISCONTD: lurasidone  40 mg Oral BID WC     PRN Meds acetaminophen, alum & mag hydroxide-simeth, haloperidol lactate, LORazepam, LORazepam, magnesium hydroxide, traZODone  Plan: continue current plan of care. Kaylee Kelley,MICKIE D. 05/28/2012

## 2012-05-28 NOTE — Progress Notes (Signed)
[facility] Group Notes:  (Counselor/Nursing/MHT/Case Management/Adjunct)  05/28/2012 11 AM  Type of Therapy:  Aftercare Planning, Group Therapy, Dance/Movement Therapy   Participation Level:  Did Not Attend   Daphane Odekirk 05/28/2012. 11:56 AM  

## 2012-05-29 NOTE — Progress Notes (Signed)
Patient ID: Kaylee Kelley, female   DOB: 03/16/1946, 66 y.o.   MRN: [id number] 05-29-12 nursing shift note:  D; rn was passing pts room and heard she was having a conversation with someone.  rn walked into the room and the pt was having a conversation with herself. A: rn attempted to reorient the pt and spoke with her. He speech is extremely hard to understand. R: distracted for only for a few minutes, rn left the room and the pt continued to have the conversation while nobody was in the room.

## 2012-05-29 NOTE — Progress Notes (Addendum)
Lab personnel in to see pt at start of shift. Pt became angry, agitated, loud and paranoid. "I'm freezing because you all keep taking my blood. I'm losing all of my blood. See (points to legs), they aren't right." Offered warm blankets with multiple attempts of support and education. Pt resistant and ultimately refuses. Labs rescheduled for AM draw. She remained loud and agitated, refused group. Overheard responding to voices. Pt compliant with hs meds however continues to have coughing and spitting up episodes with administration. Refused trazadone. Pt currently quiet in room though remains awake. Denies SI/HI. Lawrence Marseilles

## 2012-05-29 NOTE — Progress Notes (Signed)
[facility] Group Notes:  (Counselor/Nursing/MHT/Case Management/Adjunct)  05/29/2012 11 AM  Type of Therapy:  Aftercare Planning, Group Therapy, Dance/Movement Therapy   Participation Level:  Did Not Attend  Summary of Progress/Problems: pt accepted the daily workbook on healthy support systems.  Luca Burston 05/29/2012. 11:37 AM  

## 2012-05-29 NOTE — Progress Notes (Signed)
Psychoeducational Group Note  Date:  05/29/2012 Time:  0945 am  Group Topic/Focus:  Making Healthy Choices:   The focus of this group is to help patients identify negative/unhealthy choices they were using prior to admission and identify positive/healthier coping strategies to replace them upon discharge.  Participation Level:  Did Not Attend  Aubery Date J 05/29/2012, 10:29 AM  

## 2012-05-29 NOTE — Progress Notes (Signed)
  Kaylee Kelley is a 66 y.o. female [id number] 09/06/1946  05/21/2012 Principal Problem:  *Schizophrenia, paranoid type   Mental Status: Mood is irritable and agitated. Talking to herself when I entered the room. Denies SI/HI.  Subjective/Objective: Responding to internal stimuli but can be distracted.   Filed Vitals:   05/29/12 0701  BP: 156/80  Pulse: 72  Temp:   Resp:     Lab Results:   BMET    Component Value Date/Time   NA 141 05/20/2012 2315   K 4.3 05/21/2012 1244   CL 103 05/20/2012 2315   CO2 29 05/20/2012 2315   GLUCOSE 144* 05/20/2012 2315   BUN 9 05/20/2012 2315   CREATININE 0.92 05/20/2012 2315   CALCIUM 9.3 05/20/2012 2315   GFRNONAA 65* 05/20/2012 2315   GFRAA 75* 05/20/2012 2315    Medications:  Scheduled:     . divalproex  1,500 mg Oral QHS  . fluticasone  2 spray Each Nare Daily  . irbesartan  150 mg Oral Daily  . lurasidone  40 mg Oral BH-qamhs  . naproxen  500 mg Oral BID WC  . sertraline  50 mg Oral Daily     PRN Meds acetaminophen, alum & mag hydroxide-simeth, haloperidol lactate, LORazepam, LORazepam, magnesium hydroxide, traZODone  Plan check labs and depakote level   [name],MICKIE D. 05/29/2012

## 2012-05-30 DIAGNOSIS — F200 Paranoid schizophrenia: Secondary | ICD-10-CM | POA: Diagnosis not present

## 2012-05-30 LAB — COMPREHENSIVE METABOLIC PANEL
ALT: 8 U/L (ref 0–35)
AST: 14 U/L (ref 0–37)
Albumin: 3.9 g/dL (ref 3.5–5.2)
Alkaline Phosphatase: 59 U/L (ref 39–117)
BUN: 10 mg/dL (ref 6–23)
Potassium: 3.5 mEq/L (ref 3.5–5.1)
Sodium: 142 mEq/L (ref 135–145)
Total Protein: 7.5 g/dL (ref 6.0–8.3)

## 2012-05-30 LAB — VALPROIC ACID LEVEL: Valproic Acid Lvl: 115.5 ug/mL — ABNORMAL HIGH (ref 50.0–100.0)

## 2012-05-30 MED ORDER — LURASIDONE HCL 40 MG PO TABS
60.0000 mg | ORAL_TABLET | ORAL | Status: DC
  Administered 2012-05-30: 40 mg via ORAL
  Administered 2012-05-31 – 2012-06-01 (×3): 60 mg via ORAL
  Filled 2012-05-30: qty 2
  Filled 2012-05-30: qty 1
  Filled 2012-05-30 (×5): qty 2
  Filled 2012-05-30: qty 1
  Filled 2012-05-30 (×2): qty 2

## 2012-05-30 NOTE — Progress Notes (Signed)
Patient has improved; not as disorganized today.  She is not attending groups or participating in her treatment.  She isolates in her room wrapped up in her blanket exclaiming that "I am cold."  She responds to internal stimuli.  She is redirectable on the unit; she isolates in her room.  Continue with medication management and MD orders.  Collaborate with treatment team members regarding POC with patient.  Safety checks continued every 15 minutes.  Patient is calm and cooperative; her behavior is appropriate today.

## 2012-05-30 NOTE — Progress Notes (Signed)
[facility] Group Notes:  (Counselor/Nursing/MHT/Case Management/Adjunct)  05/30/2012 3:51 PM  Type of Therapy:  Group Therapy  Participation Level:  Did Not Attend    Kaylee Kelley 05/30/2012, 3:51 PM

## 2012-05-30 NOTE — Progress Notes (Signed)
Psychoeducational Group Note  Date:  05/30/2012 Time: 1100  Group Topic/Focus:  Self Care:   The focus of this group is to help patients understand the importance of self-care in order to improve or restore emotional, physical, spiritual, interpersonal, and financial health.  Participation Level:  Did Not Attend  Participation Quality:    Affect:    Cognitive:    Insight:    Engagement in Group:    Additional Comments:  none  [name], [name] 05/30/2012, 5:49 PM

## 2012-05-30 NOTE — Progress Notes (Signed)
[facility] MD Progress Note  05/30/2012 5:03 PM  Diagnosis:  Axis I: Schizophrenia - Paranoid Type.   The patient was seen today and reports the following:   ADL's: Intact.  Sleep: The patient reports to sleeping reasonably well last night but states she prefers to sleep during the day.  Appetite: The patient reports that her appetite is good today.   Mild>(1-10) >Severe  Hopelessness (1-10): 2  Depression (1-10): 2 Anxiety (1-10): 3   Suicidal Ideation: The patient denies any current suicidal ideations today.  Plan: No  Intent: No  Means: No   Homicidal Ideation: The patient denies any homicidal ideations today.  Plan: No  Intent: No.  Means: No   General Appearance/Behavior: The patient was friendly and cooperative today with this provider.  Eye Contact: Good.  Speech: Appropriate in rate and volume with no pressuring noted today.  Motor Behavior: wnl.  Level of Consciousness: Alert and Oriented x 3.  Mental Status: Alert and Oriented x 3.  Mood: Mild depression reported.  Affect: Moderately constricted.  Anxiety Level: Mild anxiety reported today.  Thought Process: Psychotic.  Thought Content: The patient reports that she is experiencing some ongoing auditory hallucinations today which are improving. She denies any visual hallucinations today or delusional thinking. Perception:. Psychotic.  Judgment: Fair.  Insight: Fair.  Cognition: Oriented to person, place and time.  Sleep:  Number of Hours: 3.75    Vital Signs:Blood pressure 138/81, pulse 73, temperature 98 F (36.7 C), temperature source Oral, resp. rate 20, height 5' 1.5" (1.562 m), weight 69.854 kg (154 lb), SpO2 100.00%.  Current Medications: Current Facility-Administered Medications  Medication Dose Route Frequency Provider Last Rate Last Dose  . acetaminophen (TYLENOL) tablet 650 mg  650 mg Oral Q6H PRN Verne Spurr, PA-C   650 mg at 05/30/12 0646  . alum & mag hydroxide-simeth (MAALOX/MYLANTA) 200-200-20  MG/5ML suspension 30 mL  30 mL Oral Q4H PRN Verne Spurr, PA-C      . divalproex (DEPAKOTE ER) 24 hr tablet 1,500 mg  1,500 mg Oral QHS Curlene Labrum Vivica Dobosz, MD   1,500 mg at 05/29/12 2058  . fluticasone (FLONASE) 50 MCG/ACT nasal spray 2 spray  2 spray Each Nare Daily Curlene Labrum Darrin Koman, MD   2 spray at 05/30/12 0912  . haloperidol lactate (HALDOL) injection 5 mg  5 mg Intramuscular Q6H PRN Curlene Labrum Holdyn Poyser, MD      . irbesartan (AVAPRO) tablet 150 mg  150 mg Oral Daily Curlene Labrum Jaquon Gingerich, MD   150 mg at 05/30/12 0911  . LORazepam (ATIVAN) tablet 1 mg  1 mg Oral Q6H PRN Ronny Bacon, MD       Or  . LORazepam (ATIVAN) injection 1 mg  1 mg Intramuscular Q6H PRN Curlene Labrum Peregrine Nolt, MD      . lurasidone (LATUDA) tablet 60 mg  60 mg Oral BH-qamhs Makayla Confer D Lacrisha Bielicki, MD      . magnesium hydroxide (MILK OF MAGNESIA) suspension 30 mL  30 mL Oral Daily PRN Verne Spurr, PA-C      . naproxen (NAPROSYN) tablet 500 mg  500 mg Oral BID WC Curlene Labrum Zo Loudon, MD   500 mg at 05/30/12 1656  . sertraline (ZOLOFT) tablet 50 mg  50 mg Oral Daily Curlene Labrum Lanai Conlee, MD   50 mg at 05/30/12 0910  . traZODone (DESYREL) tablet 100 mg  100 mg Oral QHS PRN Ronny Bacon, MD      . DISCONTD: lurasidone (LATUDA) tablet 40 mg  40 mg Oral BH-qamhs Ronny Bacon, MD   40 mg at 05/30/12 1610   Lab Results:  Results for orders placed during the hospital encounter of 05/21/12 (from the past 48 hour(s))  COMPREHENSIVE METABOLIC PANEL     Status: Abnormal   Collection Time   05/30/12  6:25 AM      Component Value Range Comment   Sodium 142  135 - 145 mEq/L    Potassium 3.5  3.5 - 5.1 mEq/L    Chloride 104  96 - 112 mEq/L    CO2 29  19 - 32 mEq/L    Glucose, Bld 99  70 - 99 mg/dL    BUN 10  6 - 23 mg/dL    Creatinine, Ser 9.60  0.50 - 1.10 mg/dL    Calcium 9.7  8.4 - 45.4 mg/dL    Total Protein 7.5  6.0 - 8.3 g/dL    Albumin 3.9  3.5 - 5.2 g/dL    AST 14  0 - 37 U/L    ALT 8  0 - 35 U/L    Alkaline Phosphatase 59  39 - 117  U/L    Total Bilirubin 0.3  0.3 - 1.2 mg/dL    GFR calc non Af Amer 68 (*) >90 mL/min    GFR calc Af Amer 79 (*) >90 mL/min   VALPROIC ACID LEVEL     Status: Abnormal   Collection Time   05/30/12  6:25 AM      Component Value Range Comment   Valproic Acid Lvl 115.5 (*) 50.0 - 100.0 ug/mL    Physical Findings: AIMS: Facial and Oral Movements Muscles of Facial Expression: None, normal Lips and Perioral Area: None, normal Jaw: None, normal Tongue: None, normal,Extremity Movements Upper (arms, wrists, hands, fingers): None, normal Lower (legs, knees, ankles, toes): None, normal, Trunk Movements Neck, shoulders, hips: None, normal, Overall Severity Severity of abnormal movements (highest score from questions above): None, normal Incapacitation due to abnormal movements: None, normal Patient's awareness of abnormal movements (rate only patient's report): No Awareness, Dental Status Current problems with teeth and/or dentures?: Yes Does patient usually wear dentures?: Yes  CIWA:  CIWA-Ar Total: 0  COWS:  COWS Total Score: 0   Review of Systems:  Neurological: The patient denies any headaches today. She denies any seizures or dizziness.  G.I.: The patient denies any constipation today or G.I. Upset.  Musculoskeletal: The patient denies any muscle or skeletal difficulties.   Time was spent today discussing with the patient her current symptoms.  The patient reports that she is now sleeping better but prefers to sleep during the day.  The patient reports a good appetite today and reports mild feelings of sadness, anhedonia and depressed mood. The patient also denies any suicidal or homicidal ideations and reports mild anxiety symptoms. The patient denies any visual hallucinations or delusional thinking but does report auditory hallucinations which are improving. She denies any medication related side effects today.   Treatment Plan Summary:  1. Daily contact with patient to assess and  evaluate symptoms and progress in treatment.  2. Medication management  3. The patient will deny suicidal ideations or homicidal ideations for 48 hours prior to discharge and have a depression and anxiety rating of 3 or less. The patient will also deny any auditory or visual hallucinations or delusional thinking.  4. The patient will deny any symptoms of substance withdrawal at time of discharge.   Plan:  1. Will continue the medication Depakote  ER at 1500 mgs po qhs for continued mood stabilization.  2. Will increase the medication Latuda to 60 mgs po q am and hs to further address her psychosis.  3. Will continue the medication Zoloft at 50 mgs po q am for depression.  4. Will continue the medication Haldol 5 mgs IM plus Ativan 1 mg po or IM q 6 hours - prn for agitation.  5. Will continue the medication Trazodone at 100 mgs po qhs - prn for sleep.  6. Laboratory studies reviewed. 7. Will repeat the patient's Serum Depakote this evening to re-evaluate the patient's current Depakote dosage.  8. Will continue to monitor.    Breona Cherubin 05/30/2012, 5:03 PM

## 2012-05-30 NOTE — Progress Notes (Signed)
D.  Pt. Labile.  Did not attend group this evening.  Pt. Ate a fish sandwich which was brought in by her niece and a fruit cup from [employer].  Pt. Denies SI/HI and denies Visual hallucinations.  Reports hearing voices but reports that she does not do what they say to do.  Pt. Is paranoid, reporting that she doesn't sleep at night because she fears someone will rape her and that she sleeps in the day time. A.  Pt. Told she is checked every 15 min for safety. R.  Pt. Remains safe.

## 2012-05-30 NOTE — Progress Notes (Signed)
05/30/2012         Time: 0930      Group Topic/Focus: The focus of this group is on enhancing the patient's understanding of leisure, barriers to leisure, and the importance of engaging in positive leisure activities upon discharge for improved total health.  Participation Level: Did Not Attend  Participation Quality: Not Applicable  Affect: Not Applicable  Cognitive: Not Applicable   Additional Comments: Patient refused group.  Kylar Speelman 05/30/2012 11:53 AM    

## 2012-05-30 NOTE — Progress Notes (Signed)
Psychoeducational Group Note  Date:  05/30/2012 Time: 2000 Group Topic/Focus:  Wrap-Up Group:   The focus of this group is to help patients review their daily goal of treatment and discuss progress on daily workbooks.  Participation Level: Did Not Attend  Participation Quality:  Not Applicable  Affect:  Not Applicable  Cognitive:  Not Applicable  Insight:  Not Applicable  Engagement in Group: Not Applicable  Additional Comments:    Kaylee Kelley 05/30/2012, 8:57 PM

## 2012-05-31 DIAGNOSIS — F200 Paranoid schizophrenia: Secondary | ICD-10-CM | POA: Diagnosis not present

## 2012-05-31 MED ORDER — POTASSIUM CHLORIDE CRYS ER 20 MEQ PO TBCR
20.0000 meq | EXTENDED_RELEASE_TABLET | Freq: Every day | ORAL | Status: DC
  Administered 2012-06-01 – 2012-06-08 (×8): 20 meq via ORAL
  Filled 2012-05-31 (×5): qty 1
  Filled 2012-05-31: qty 14
  Filled 2012-05-31 (×4): qty 1

## 2012-05-31 MED ORDER — HYDROCHLOROTHIAZIDE 25 MG PO TABS
12.5000 mg | ORAL_TABLET | Freq: Every day | ORAL | Status: DC
  Filled 2012-05-31 (×2): qty 0.5

## 2012-05-31 NOTE — Progress Notes (Signed)
Physician made aware.Dr. Readling 

## 2012-05-31 NOTE — Progress Notes (Signed)
Physician made aware of blood pressure

## 2012-05-31 NOTE — Progress Notes (Signed)
Psychoeducational Group Note  Date:  05/31/2012 Time:  1100  Group Topic/Focus:  Recovery Goals:   The focus of this group is to identify appropriate goals for recovery and establish a plan to achieve them.  Participation Level: Did Not Attend  Participation Quality:  Not Applicable  Affect:  Not Applicable  Cognitive:  Not Applicable  Insight:  Not Applicable  Engagement in Group: Not Applicable  Additional Comments:  Pt refused to attend group this morning.  Adilyn Humes E 05/31/2012, 12:06 PM

## 2012-05-31 NOTE — Progress Notes (Signed)
D: Patient is isolative; ; patient responds to internal stimuli; patient declined self inventory; patient can be assertive when interacting with staff at times but overall interaction is minimal; A: encouraged to discuss feelings and/or concerns; offered emotional support; monitored every 15 minutes; medication administration/physician orders R: Patient is cooperative and compliant with medication and can be redirected; patient is not attending groups; patient remains isolative and responding to internal stimuli

## 2012-05-31 NOTE — Discharge Planning (Signed)
Pt did not attend discharge planning group.   

## 2012-05-31 NOTE — Progress Notes (Addendum)
D: Patient in dayroom on approach with grandchildren. Patient states she had a good day but she is tired.  Patient denies pain.  Patient denies SI/HI but is still having auditory hallucinations.  Patient interactive within the milieu but not interacting with others.   A: Staff to monitor Q 15 mins for safety. Patient offered encouragement and support.  Scheduled medications administered per orders.   R: Patient remains safe at this time.  Patient taking administered medications.

## 2012-05-31 NOTE — Progress Notes (Signed)
Physician made aware.Dr. Allena Katz

## 2012-05-31 NOTE — Progress Notes (Signed)
Patient resting quietly with eyes closed. Respirations even and unlabored. No distress noted. Q 15 minute check continues to maintain safety   

## 2012-05-31 NOTE — Progress Notes (Signed)
[facility] MD Progress Note  05/31/2012 6:32 PM  Diagnosis:  Axis I: Schizophrenia - Paranoid Type.   The patient was seen today and reports the following:   ADL's: Intact.  Sleep: The patient reports to sleeping reasonably well.  Appetite: The patient reports that her appetite is good today.   Mild>(1-10) >Severe  Hopelessness (1-10): 2  Depression (1-10): 2  Anxiety (1-10): 2   Suicidal Ideation: The patient denies any current suicidal ideations today.  Plan: No  Intent: No  Means: No   Homicidal Ideation: The patient denies any homicidal ideations today.  Plan: No  Intent: No.  Means: No   General Appearance/Behavior: The patient was minimally cooperative today with this provider and somewhat agitated.  Eye Contact: Good.  Speech: Appropriate in rate and volume with no pressuring noted today.  Motor Behavior: wnl.  Level of Consciousness: Alert and Oriented x 3.  Mental Status: Alert and Oriented x 3.  Mood: Mild depression reported.  Affect: Mild to moderately agitated.  Anxiety Level: Mild anxiety reported today.  Thought Process: Psychotic.  Thought Content: The patient reports that she is experiencing some ongoing auditory hallucinations today which are improving. She denies any visual hallucinations today or delusional thinking.  Perception:. Psychotic.  Judgment: Fair.  Insight: Fair.  Cognition: Oriented to person, place and time.  Sleep:  Number of Hours: 6    Vital Signs:Blood pressure 179/75, pulse 69, temperature 98.6 F (37 C), temperature source Oral, resp. rate 18, height 5' 1.5" (1.562 m), weight 69.854 kg (154 lb), SpO2 100.00%.  Current Medications: Current Facility-Administered Medications  Medication Dose Route Frequency Provider Last Rate Last Dose  . acetaminophen (TYLENOL) tablet 650 mg  650 mg Oral Q6H PRN Verne Spurr, PA-C   650 mg at 05/30/12 0646  . alum & mag hydroxide-simeth (MAALOX/MYLANTA) 200-200-20 MG/5ML suspension 30 mL  30 mL Oral  Q4H PRN Verne Spurr, PA-C      . divalproex (DEPAKOTE ER) 24 hr tablet 1,500 mg  1,500 mg Oral QHS Curlene Labrum Cassiel Fernandez, MD   1,500 mg at 05/30/12 2029  . fluticasone (FLONASE) 50 MCG/ACT nasal spray 2 spray  2 spray Each Nare Daily Curlene Labrum Dakwan Pridgen, MD   2 spray at 05/31/12 1610  . haloperidol lactate (HALDOL) injection 5 mg  5 mg Intramuscular Q6H PRN Curlene Labrum Jerryl Holzhauer, MD      . hydrochlorothiazide (HYDRODIURIL) tablet 12.5 mg  12.5 mg Oral Daily Curlene Labrum Dejai Schubach, MD      . irbesartan (AVAPRO) tablet 150 mg  150 mg Oral Daily Curlene Labrum Becki Mccaskill, MD   150 mg at 05/31/12 0832  . LORazepam (ATIVAN) tablet 1 mg  1 mg Oral Q6H PRN Ronny Bacon, MD       Or  . LORazepam (ATIVAN) injection 1 mg  1 mg Intramuscular Q6H PRN Curlene Labrum Dhruv Christina, MD      . lurasidone (LATUDA) tablet 60 mg  60 mg Oral BH-qamhs Curlene Labrum Levi Klaiber, MD   60 mg at 05/31/12 0815  . magnesium hydroxide (MILK OF MAGNESIA) suspension 30 mL  30 mL Oral Daily PRN Verne Spurr, PA-C      . naproxen (NAPROSYN) tablet 500 mg  500 mg Oral BID WC Curlene Labrum Bridgitte Felicetti, MD   500 mg at 05/31/12 1733  . potassium chloride SA (K-DUR,KLOR-CON) CR tablet 20 mEq  20 mEq Oral Q breakfast Curlene Labrum Xian Apostol, MD      . sertraline (ZOLOFT) tablet 50 mg  50 mg Oral  Daily Curlene Labrum Tiesha Marich, MD   50 mg at 05/31/12 0815  . traZODone (DESYREL) tablet 100 mg  100 mg Oral QHS PRN Ronny Bacon, MD       Lab Results:  Results for orders placed during the hospital encounter of 05/21/12 (from the past 48 hour(s))  COMPREHENSIVE METABOLIC PANEL     Status: Abnormal   Collection Time   05/30/12  6:25 AM      Component Value Range Comment   Sodium 142  135 - 145 mEq/L    Potassium 3.5  3.5 - 5.1 mEq/L    Chloride 104  96 - 112 mEq/L    CO2 29  19 - 32 mEq/L    Glucose, Bld 99  70 - 99 mg/dL    BUN 10  6 - 23 mg/dL    Creatinine, Ser 4.09  0.50 - 1.10 mg/dL    Calcium 9.7  8.4 - 81.1 mg/dL    Total Protein 7.5  6.0 - 8.3 g/dL    Albumin 3.9  3.5 - 5.2 g/dL     AST 14  0 - 37 U/L    ALT 8  0 - 35 U/L    Alkaline Phosphatase 59  39 - 117 U/L    Total Bilirubin 0.3  0.3 - 1.2 mg/dL    GFR calc non Af Amer 68 (*) >90 mL/min    GFR calc Af Amer 79 (*) >90 mL/min   VALPROIC ACID LEVEL     Status: Abnormal   Collection Time   05/30/12  6:25 AM      Component Value Range Comment   Valproic Acid Lvl 115.5 (*) 50.0 - 100.0 ug/mL    Physical Findings: AIMS: Facial and Oral Movements Muscles of Facial Expression: None, normal Lips and Perioral Area: None, normal Jaw: None, normal Tongue: None, normal,Extremity Movements Upper (arms, wrists, hands, fingers): None, normal Lower (legs, knees, ankles, toes): None, normal, Trunk Movements Neck, shoulders, hips: None, normal, Overall Severity Severity of abnormal movements (highest score from questions above): None, normal Incapacitation due to abnormal movements: None, normal Patient's awareness of abnormal movements (rate only patient's report): No Awareness, Dental Status Current problems with teeth and/or dentures?: Yes Does patient usually wear dentures?: Yes  CIWA:  CIWA-Ar Total: 0  COWS:  COWS Total Score: 0   Review of Systems:  Neurological: The patient denies any headaches today. She denies any seizures or dizziness.  G.I.: The patient denies any constipation today or G.I. Upset.  Musculoskeletal: The patient denies any muscle or skeletal difficulties.   Time was spent today discussing with the patient her current symptoms. The patient reports that she is now sleeping without difficulty. The patient reports a good appetite today and reports mild feelings of sadness, anhedonia and depressed mood. The patient also denies any suicidal or homicidal ideations and reports mild anxiety symptoms. The patient denies any visual hallucinations or delusional thinking but does report auditory hallucinations which are improving. She denies any medication related side effects today.  She was mild to moderately  agitated this morning when evaluated.  Treatment Plan Summary:  1. Daily contact with patient to assess and evaluate symptoms and progress in treatment.  2. Medication management  3. The patient will deny suicidal ideations or homicidal ideations for 48 hours prior to discharge and have a depression and anxiety rating of 3 or less. The patient will also deny any auditory or visual hallucinations or delusional thinking.  4. The patient will  deny any symptoms of substance withdrawal at time of discharge.   Plan:  1. Will continue the medication Depakote ER at 1500 mgs po qhs for continued mood stabilization.  2. Will continue the medication Latuda at 60 mgs po q am and hs to continue to address her psychosis.  3. Will continue the medication Zoloft at 50 mgs po q am for depression.  4. Will continue the medication Haldol 5 mgs IM plus Ativan 1 mg po or IM q 6 hours - prn for agitation.  5. Will continue the medication Trazodone at 100 mgs po qhs - prn for sleep.  6. Will start the patient on the medication HCTZ 12.5 mgs po q am for blood pressure issues. 7. Laboratory studies reviewed.  8. Will repeat the patient's Serum Depakote this evening to re-evaluate the patient's current Depakote dosage since this morning level was elevated.  9. Will continue to monitor.   Kenli Waldo 05/31/2012, 6:32 PM

## 2012-05-31 NOTE — Progress Notes (Signed)
[facility] Group Notes:  (Counselor/Nursing/MHT/Case Management/Adjunct)  05/31/2012 2:55 PM  Type of Therapy:  Group Therapy  Participation Level:  Did Not Attend  :   Kaylee Kelley 05/31/2012, 2:55 PM

## 2012-06-01 DIAGNOSIS — F200 Paranoid schizophrenia: Secondary | ICD-10-CM | POA: Diagnosis not present

## 2012-06-01 MED ORDER — LURASIDONE HCL 80 MG PO TABS
80.0000 mg | ORAL_TABLET | ORAL | Status: DC
Start: 2012-06-01 — End: 2012-06-08
  Administered 2012-06-01 – 2012-06-08 (×14): 80 mg via ORAL
  Filled 2012-06-01: qty 1
  Filled 2012-06-01: qty 28
  Filled 2012-06-01 (×6): qty 1
  Filled 2012-06-01: qty 28
  Filled 2012-06-01 (×8): qty 1

## 2012-06-01 MED ORDER — HYDROCHLOROTHIAZIDE 12.5 MG PO CAPS
12.5000 mg | ORAL_CAPSULE | Freq: Every day | ORAL | Status: DC
  Administered 2012-06-01: 12.5 mg via ORAL
  Filled 2012-06-01 (×3): qty 1

## 2012-06-01 MED ORDER — HYDROCHLOROTHIAZIDE 12.5 MG PO CAPS
25.0000 mg | ORAL_CAPSULE | Freq: Every day | ORAL | Status: DC
  Administered 2012-06-02 – 2012-06-07 (×6): 25 mg via ORAL
  Filled 2012-06-01 (×7): qty 2

## 2012-06-01 NOTE — Discharge Planning (Signed)
06/01/2012  Pt did not attend d/c planning group on this date. SW met with pt individually at this time.   Met with pt. In treatment team.  Pt stated she has fair sleep and appetite.  Pt denies SI.  Pt states she has no feelings of depression or anxiety.  SW to call pt. Niece.  Gordy Councilman.    Clarice Pole, LCASA 06/01/2012, 3:35 PM

## 2012-06-01 NOTE — Progress Notes (Signed)
[facility] MD Progress Note  06/01/2012 9:02 PM  Diagnosis:  Axis I: Schizophrenia - Paranoid Type.   The patient was seen today and reports the following:   ADL's: Intact.  Sleep: The patient reports to sleeping "fair" last night.  Appetite: The patient reports that her appetite is good today.   Mild>(1-10) >Severe  Hopelessness (1-10): 0  Depression (1-10): 0  Anxiety (1-10): 0   Suicidal Ideation: The patient denies any current suicidal ideations today.  Plan: No  Intent: No  Means: No   Homicidal Ideation: The patient denies any homicidal ideations today.  Plan: No  Intent: No.  Means: No   General Appearance/Behavior: The patient was cooperative today with this provider with no agitation.  Eye Contact: Good.  Speech: Appropriate in rate and volume with no pressuring noted today.  Motor Behavior: wnl.  Level of Consciousness: Alert and Oriented x 3.  Mental Status: Alert and Oriented x 3.  Mood: Appears mildly depressed today.  Affect: Mild to moderately constricted.  Anxiety Level: No anxiety reported today.  Thought Process: Psychotic.  Thought Content: The patient reports that she is experiencing some ongoing auditory hallucinations today which are improving. She denies any visual hallucinations today or delusional thinking.  Perception:. Psychotic.  Judgment: Fair.  Insight: Fair.  Cognition: Oriented to person, place and time.  Sleep:  Number of Hours: 3.75    Vital Signs:Blood pressure 168/96, pulse 74, temperature 97.7 F (36.5 C), temperature source Oral, resp. rate 20, height 5' 1.5" (1.562 m), weight 69.854 kg (154 lb), SpO2 100.00%.  Current Medications: Current Facility-Administered Medications  Medication Dose Route Frequency Provider Last Rate Last Dose  . acetaminophen (TYLENOL) tablet 650 mg  650 mg Oral Q6H PRN Verne Spurr, PA-C   650 mg at 05/30/12 0646  . alum & mag hydroxide-simeth (MAALOX/MYLANTA) 200-200-20 MG/5ML suspension 30 mL  30 mL Oral  Q4H PRN Verne Spurr, PA-C      . divalproex (DEPAKOTE ER) 24 hr tablet 1,500 mg  1,500 mg Oral QHS Curlene Labrum Ademola Vert, MD   1,500 mg at 05/31/12 2152  . fluticasone (FLONASE) 50 MCG/ACT nasal spray 2 spray  2 spray Each Nare Daily Curlene Labrum Maylani Embree, MD   2 spray at 06/01/12 0814  . haloperidol lactate (HALDOL) injection 5 mg  5 mg Intramuscular Q6H PRN Curlene Labrum Niels Cranshaw, MD      . hydrochlorothiazide (MICROZIDE) capsule 25 mg  25 mg Oral Daily Curlene Labrum Soleia Badolato, MD      . irbesartan (AVAPRO) tablet 150 mg  150 mg Oral Daily Curlene Labrum Kennette Cuthrell, MD   150 mg at 06/01/12 0811  . LORazepam (ATIVAN) tablet 1 mg  1 mg Oral Q6H PRN Ronny Bacon, MD       Or  . LORazepam (ATIVAN) injection 1 mg  1 mg Intramuscular Q6H PRN Curlene Labrum Jamison Yuhasz, MD      . lurasidone (LATUDA) tablet 80 mg  80 mg Oral BH-qamhs Devani Odonnel D Rayen Palen, MD      . magnesium hydroxide (MILK OF MAGNESIA) suspension 30 mL  30 mL Oral Daily PRN Verne Spurr, PA-C      . naproxen (NAPROSYN) tablet 500 mg  500 mg Oral BID WC Curlene Labrum Goldye Tourangeau, MD   500 mg at 06/01/12 1703  . potassium chloride SA (K-DUR,KLOR-CON) CR tablet 20 mEq  20 mEq Oral Q breakfast Curlene Labrum Almer Littleton, MD   20 mEq at 06/01/12 0811  . sertraline (ZOLOFT) tablet 50 mg  50 mg  Oral Daily Ronny Bacon, MD   50 mg at 06/01/12 4098  . traZODone (DESYREL) tablet 100 mg  100 mg Oral QHS PRN Ronny Bacon, MD      . DISCONTD: hydrochlorothiazide (HYDRODIURIL) tablet 12.5 mg  12.5 mg Oral Daily Ronny Bacon, MD      . DISCONTD: hydrochlorothiazide (MICROZIDE) capsule 12.5 mg  12.5 mg Oral Daily Curlene Labrum Marthena Whitmyer, MD   12.5 mg at 06/01/12 0811  . DISCONTD: lurasidone (LATUDA) tablet 60 mg  60 mg Oral BH-qamhs Ronny Bacon, MD   60 mg at 06/01/12 1191   Lab Results:  Results for orders placed during the hospital encounter of 05/21/12 (from the past 48 hour(s))  VALPROIC ACID LEVEL     Status: Normal   Collection Time   05/31/12  7:22 PM      Component Value Range Comment     Valproic Acid Lvl 72.5  50.0 - 100.0 ug/mL    Physical Findings: AIMS: Facial and Oral Movements Muscles of Facial Expression: Mild Lips and Perioral Area: Mild Jaw: None, normal Tongue: Minimal,Extremity Movements Upper (arms, wrists, hands, fingers): None, normal Lower (legs, knees, ankles, toes): None, normal, Trunk Movements Neck, shoulders, hips: None, normal, Overall Severity Severity of abnormal movements (highest score from questions above): None, normal Incapacitation due to abnormal movements: None, normal Patient's awareness of abnormal movements (rate only patient's report): No Awareness, Dental Status Current problems with teeth and/or dentures?: Yes Does patient usually wear dentures?: Yes  CIWA:  CIWA-Ar Total: 0  COWS:  COWS Total Score: 0   Review of Systems:  Neurological: The patient denies any headaches today. She denies any seizures or dizziness.  G.I.: The patient denies any constipation today or G.I. Upset.  Musculoskeletal: The patient denies any muscle or skeletal difficulties.   Time was spent today discussing with the patient her current symptoms. The patient reports that she slept "fair" last night. The patient reports a good appetite today and appears to be displaying mild feelings of sadness, anhedonia and depressed mood. The patient denies any suicidal or homicidal ideations and denies any anxiety symptoms. The patient denies any visual hallucinations or delusional thinking but does report auditory hallucinations which are improving. She denies any medication related side effects today.   Treatment Plan Summary:  1. Daily contact with patient to assess and evaluate symptoms and progress in treatment.  2. Medication management  3. The patient will deny suicidal ideations or homicidal ideations for 48 hours prior to discharge and have a depression and anxiety rating of 3 or less. The patient will also deny any auditory or visual hallucinations or  delusional thinking.  4. The patient will deny any symptoms of substance withdrawal at time of discharge.   Plan:  1. Will continue the medication Depakote ER at 1500 mgs po qhs for continued mood stabilization.  2. Will increase the medication Latuda to 80 mgs po q am and hs to further address her psychosis.  3. Will continue the medication Zoloft at 50 mgs po q am for depression.  4. Will continue the medication Haldol 5 mgs IM plus Ativan 1 mg po or IM q 6 hours - prn for agitation.  5. Will continue the medication Trazodone at 100 mgs po qhs - prn for sleep.  6. Will increase the medication HCTZ to 25 mgs po q am for blood pressure issues.  7. Laboratory studies reviewed.  8. Will continue to monitor.   Comfort Iversen 06/01/2012,  9:02 PM

## 2012-06-01 NOTE — Progress Notes (Signed)
Blood pressure rechecked after medications given; physician notified of blood pressure

## 2012-06-01 NOTE — Progress Notes (Signed)
Currently resting quietly in her chair with eyes closed. Respirations are even and unlabored. No acute distress noted. Safety has been maintained with Q15 minute observation. Will continue current POC.

## 2012-06-01 NOTE — Progress Notes (Signed)
D: Patient denies SI/HI but admits to some auditory voices sometimes and observed responding to some internal stimuli; patient's agitation has decreased; reports that she had a fair night of sleep; reports her appetite is good and her energy level is normal; ability to pay attention is improving; reports depression and hopelessness 0/10 and anxiety as 0/10; reports some physical problems of headaches and pain but denies any at this time of assessment and we will continue to monitor; A: offered emotional support; encouraged patient to attend groups and to express any feelings and/or concerns R: Patient is isolative and is calm and cooperative at this time; patient does not attend groups;and denies pain at this time

## 2012-06-01 NOTE — Progress Notes (Signed)
Psychoeducational Group Note  Date:  06/01/2012 Time:  1100  Group Topic/Focus:  Personal Choices and Values:   The focus of this group is to help patients assess and explore the importance of values in their lives, how their values affect their decisions, how they express their values and what opposes their expression.  Participation Level:  Did Not Attend  Participation Quality:    Affect:    Cognitive:    Insight:    Engagement in Group:    Additional Comments:  Pt refused, pt has not attended any groups.  Isla Pence M 06/01/2012, 11:36 AM

## 2012-06-01 NOTE — Progress Notes (Signed)
[facility] Group Notes:  (Counselor/Nursing/MHT/Case Management/Adjunct)  06/01/2012 3:02 PM  Type of Therapy:  Psychoeducational Skills  Participation Level:  Did Not Attend    Veto Kemps 06/01/2012, 3:02 PM

## 2012-06-01 NOTE — Progress Notes (Signed)
Did not attend wrap up group

## 2012-06-01 NOTE — Progress Notes (Signed)
Blood pressure rechecked after medications given; physician notified of blood pressure 

## 2012-06-01 NOTE — Treatment Plan (Signed)
Interdisciplinary Treatment Plan Update (Adult) Date: 06/01/2012  Time Reviewed: 3:27 PM  Progress in Treatment: Attending groups: Yes Participating in groups: Yes Taking medication as prescribed: Yes Tolerating medication: Yes Family/Significant othe contact made: Yes, SW spoke with Gordy Councilman follow-up update still needed Patient understands diagnosis: Yes Discussing patient identified problems/goals with staff: Yes Medical problems stabilized or resolved: Yes Denies suicidal/homicidal ideation: Yes Issues/concerns per patient self-inventory: None identified Other: N/A New problem(s) identified: None Identified Reason for Continuation of Hospitalization: Anxiety Hallucinations Medication stabilization Interventions implemented related to continuation of hospitalization: mood stabilization, medication monitoring and adjustment, group therapy and psycho education, safety checks q 15 mins Additional comments: N/A Estimated length of stay:  Discharge Plan: SW is assessing for appropriate referrals.  New goal(s): N/A Review of initial/current patient goals per problem list:  1. Goal(s): Reduce anxiety/depressive symptoms  Met: Yes  Target date: by discharge  As evidenced by: Reducing anxiety/depression from a 10 to a 3 as reported by pt.  2. Goal (s): Eliminate Suicidal Ideation  Met: Yes  Target date: by discharge  As evidenced by: Eliminate suicidal ideation. Per pt. Report. 3. Goal(s): Reduce Psychosis-AH  Met: No  Target date: by discharge  As evidenced by: Reduce psychotic symptoms to baseline, as reported by pt. (Pt hears voices sometimes per report today) 4. Goal(s):  Address Follow-up  Met: Yes  Target date: by discharge  As evidenced by:  Referral to current provider. Attendees: Patient: Kaylee Kelley   Family:    Physician: Franchot Gallo, MD 06/01/2012 3:27 PM   Nursing:    Case Manager: Clarice Pole, LCASA 06/01/2012 3:27 PM   Counselor:  Veto Kemps, MT-BC 06/01/2012 3:27 PM   Other: Roswell Miners, RN 06/01/2012 3:27 PM   Other:    Other:    Other:    Scribe for Treatment Team:  Clarice Pole, LCASA 06/01/2012 3:27 PM

## 2012-06-01 NOTE — Progress Notes (Signed)
06/01/2012         Time: 0930      Group Topic/Focus: The focus of this group is on discussing various aspects of wellness, balancing those aspects and exploring ways to increase the ability to experience wellness.  Participation Level: Did not attend  Participation Quality: Not Applicable  Affect: Not Applicable  Cognitive: Not Applicable   Additional Comments: Patient refused group, reports she is too tired today.  Kaylee Kelley 06/01/2012 10:13 AM

## 2012-06-02 DIAGNOSIS — F200 Paranoid schizophrenia: Secondary | ICD-10-CM | POA: Diagnosis not present

## 2012-06-02 LAB — COMPREHENSIVE METABOLIC PANEL
AST: 14 U/L (ref 0–37)
Albumin: 4.1 g/dL (ref 3.5–5.2)
Alkaline Phosphatase: 60 U/L (ref 39–117)
BUN: 13 mg/dL (ref 6–23)
Chloride: 99 mEq/L (ref 96–112)
Potassium: 3.8 mEq/L (ref 3.5–5.1)
Total Bilirubin: 0.2 mg/dL — ABNORMAL LOW (ref 0.3–1.2)

## 2012-06-02 NOTE — Progress Notes (Signed)
[facility] Group Notes:  (Counselor/Nursing/MHT/Case Management/Adjunct)  06/02/2012 3:31 PM  Type of Therapy:  Group Therapy  9:30, Music Therapy 1:15  Participation Level:  Did Not Attend     Kaylee Kelley 06/02/2012, 3:31 PM

## 2012-06-02 NOTE — Progress Notes (Signed)
D: pt has been isolating to her room. Brightens on approach. Pt. Takes her medications as prescribed.Pt.carries a small brown bag under her left arm @ all times. Denies SI,HI, but does hear voices.A: supported & encouraged.Continues on 15 minute checks. R: Pt safety maintained.

## 2012-06-02 NOTE — Progress Notes (Signed)
Psychoeducational Group Note  Date:  06/02/2012 Time:  2000   Group Topic/Focus:  Karaoke  Participation Level: Did Not Attend  Participation Quality:  Not Applicable  Affect:  Not Applicable  Cognitive:  Not Applicable  Insight:  Not Applicable  Engagement in Group: Not Applicable  Additional Comments:  Pt stated that she did not like the music that is played at [employer].  Kaleen Odea R 06/02/2012, 8:34 PM

## 2012-06-02 NOTE — Progress Notes (Signed)
Psychoeducational Group Note  Date:  06/02/2012 Time:  1100  Group Topic/Focus:  Self Esteem Action Plan:   The focus of this group is to help patients create a plan to continue to build self-esteem after discharge.  Participation Level:  Did Not Attend  Participation Quality:    Affect:    Cognitive:    Insight:    Engagement in Group:    Additional Comments: pt refused to attend.  Isla Pence M 06/02/2012, 12:20 PM

## 2012-06-02 NOTE — Progress Notes (Signed)
D: Patient denies SI/HI but does admit to some auditory hallucinations and states that they are not commands but will not give any more details; patient reports that her sleep was fair and stated " I rest good"; reports her appetite as good and her energy level as normal; reports her ability to pay attention as good; rates her depression and hopelessness as 0/10; patient complains of some knee pain but states " I don't want anything now" and reports that her head was hurting but now it is no longer hurting A: Patient has been encouraged to attend groups; emotional support offered; medication administration/physician orders; monitored every 15 minutes R: Patient is cooperative and less agitated; patient is not attending groups; patient is redirectable and can be assertive; patient has minimal interaction on the milieu; reports that pain has decreased and that she no longer has a headache

## 2012-06-02 NOTE — Progress Notes (Signed)
[facility] MD Progress Note  06/02/2012 9:22 PM  Diagnosis:  Axis I: Schizophrenia - Paranoid Type.   The patient was seen today and reports the following:   ADL's: Intact.  Sleep: The patient reports to once again having difficulty initiating and maintaining sleep.  Appetite: The patient reports that her appetite is good today.   Mild>(1-10) >Severe  Hopelessness (1-10): 0  Depression (1-10): 0  Anxiety (1-10): 0   Suicidal Ideation: The patient denies any current suicidal ideations today.  Plan: No  Intent: No  Means: No   Homicidal Ideation: The patient denies any homicidal ideations today.  Plan: No  Intent: No.  Means: No   General Appearance/Behavior: The patient was cooperative today with this provider with no agitation.  Eye Contact: Good.  Speech: Appropriate in rate and volume with no pressuring noted today.  Motor Behavior: wnl.  Level of Consciousness: Alert and Oriented x 3.  Mental Status: Alert and Oriented x 3.  Mood: Appears mildly depressed today.  Affect: Mildly constricted.  Anxiety Level: No anxiety reported today.  Thought Process: Psychotic.  Thought Content: The patient reports that she is experiencing some ongoing auditory hallucinations today which are improving. She denies any visual hallucinations today or delusional thinking.  Perception:. Psychotic.  Judgment: Fair.  Insight: Fair.  Cognition: Oriented to person, place and time.  Sleep:  Number of Hours: 3.5    Vital Signs:Blood pressure 155/83, pulse 73, temperature 96 F (35.6 C), temperature source Oral, resp. rate 20, height 5' 1.5" (1.562 m), weight 69.854 kg (154 lb), SpO2 100.00%.  Current Medications: Current Facility-Administered Medications  Medication Dose Route Frequency Provider Last Rate Last Dose  . acetaminophen (TYLENOL) tablet 650 mg  650 mg Oral Q6H PRN Verne Spurr, PA-C   650 mg at 05/30/12 0646  . alum & mag hydroxide-simeth (MAALOX/MYLANTA) 200-200-20 MG/5ML suspension  30 mL  30 mL Oral Q4H PRN Verne Spurr, PA-C      . divalproex (DEPAKOTE ER) 24 hr tablet 1,500 mg  1,500 mg Oral QHS Curlene Labrum Readling, MD   1,500 mg at 06/02/12 2119  . fluticasone (FLONASE) 50 MCG/ACT nasal spray 2 spray  2 spray Each Nare Daily Curlene Labrum Readling, MD   2 spray at 06/02/12 0830  . haloperidol lactate (HALDOL) injection 5 mg  5 mg Intramuscular Q6H PRN Curlene Labrum Readling, MD      . hydrochlorothiazide (MICROZIDE) capsule 25 mg  25 mg Oral Daily Curlene Labrum Readling, MD   25 mg at 06/02/12 0828  . irbesartan (AVAPRO) tablet 150 mg  150 mg Oral Daily Curlene Labrum Readling, MD   150 mg at 06/02/12 0829  . LORazepam (ATIVAN) tablet 1 mg  1 mg Oral Q6H PRN Ronny Bacon, MD       Or  . LORazepam (ATIVAN) injection 1 mg  1 mg Intramuscular Q6H PRN Curlene Labrum Readling, MD      . lurasidone (LATUDA) tablet 80 mg  80 mg Oral BH-qamhs Curlene Labrum Readling, MD   80 mg at 06/02/12 2119  . magnesium hydroxide (MILK OF MAGNESIA) suspension 30 mL  30 mL Oral Daily PRN Verne Spurr, PA-C      . naproxen (NAPROSYN) tablet 500 mg  500 mg Oral BID WC Curlene Labrum Readling, MD   500 mg at 06/02/12 1713  . potassium chloride SA (K-DUR,KLOR-CON) CR tablet 20 mEq  20 mEq Oral Q breakfast Curlene Labrum Readling, MD   20 mEq at 06/02/12 0829  .  sertraline (ZOLOFT) tablet 50 mg  50 mg Oral Daily Curlene Labrum Readling, MD   50 mg at 06/02/12 0829  . traZODone (DESYREL) tablet 100 mg  100 mg Oral QHS PRN Ronny Bacon, MD       Lab Results:  Results for orders placed during the hospital encounter of 05/21/12 (from the past 48 hour(s))  COMPREHENSIVE METABOLIC PANEL     Status: Abnormal   Collection Time   06/02/12  8:13 PM      Component Value Range Comment   Sodium 137  135 - 145 mEq/L    Potassium 3.8  3.5 - 5.1 mEq/L    Chloride 99  96 - 112 mEq/L    CO2 29  19 - 32 mEq/L    Glucose, Bld 95  70 - 99 mg/dL    BUN 13  6 - 23 mg/dL    Creatinine, Ser 1.61  0.50 - 1.10 mg/dL    Calcium 9.2  8.4 - 09.6 mg/dL    Total Protein  7.5  6.0 - 8.3 g/dL    Albumin 4.1  3.5 - 5.2 g/dL    AST 14  0 - 37 U/L    ALT 6  0 - 35 U/L    Alkaline Phosphatase 60  39 - 117 U/L    Total Bilirubin 0.2 (*) 0.3 - 1.2 mg/dL    GFR calc non Af Amer 64 (*) >90 mL/min    GFR calc Af Amer 74 (*) >90 mL/min    Physical Findings: AIMS: Facial and Oral Movements Muscles of Facial Expression: Mild Lips and Perioral Area: Mild Jaw: None, normal Tongue: Minimal,Extremity Movements Upper (arms, wrists, hands, fingers): None, normal Lower (legs, knees, ankles, toes): None, normal, Trunk Movements Neck, shoulders, hips: None, normal, Overall Severity Severity of abnormal movements (highest score from questions above): None, normal Incapacitation due to abnormal movements: None, normal Patient's awareness of abnormal movements (rate only patient's report): No Awareness, Dental Status Current problems with teeth and/or dentures?: Yes Does patient usually wear dentures?: Yes  CIWA:  CIWA-Ar Total: 0  COWS:  COWS Total Score: 0   Review of Systems:  Neurological: The patient denies any headaches today. She denies any seizures or dizziness.  G.I.: The patient denies any constipation today or G.I. Upset.  Musculoskeletal: The patient denies any muscle or skeletal difficulties.   Time was spent today discussing with the patient her current symptoms. The patient reports that she is continuing to have difficulty sleeping at night but appears to be sleeping during the day. The patient reports a good appetite today and appears to be displaying mild feelings of sadness, anhedonia and depressed mood. The patient denies any suicidal or homicidal ideations and denies any anxiety symptoms. The patient denies any visual hallucinations or delusional thinking but does report auditory hallucinations which are improving. She denies any medication related side effects today.   Treatment Plan Summary:  1. Daily contact with patient to assess and evaluate symptoms  and progress in treatment.  2. Medication management  3. The patient will deny suicidal ideations or homicidal ideations for 48 hours prior to discharge and have a depression and anxiety rating of 3 or less. The patient will also deny any auditory or visual hallucinations or delusional thinking.  4. The patient will deny any symptoms of substance withdrawal at time of discharge.   Plan:  1. Will continue the medication Depakote ER at 1500 mgs po qhs for continued mood stabilization.  2.  Will continue the medication Latuda at 80 mgs po q am and hs to continue to address her psychosis.  3. Will continue the medication Zoloft at 50 mgs po q am for depression.  4. Will continue the medication Haldol 5 mgs IM plus Ativan 1 mg po or IM q 6 hours - prn for agitation.  5. Will continue the medication Trazodone at 100 mgs po qhs - prn for sleep.  6. Will continue the medication HCTZ to 25 mgs po q am for blood pressure issues.  7. Laboratory studies reviewed.  8. Will continue to monitor.   RANDY READLING 06/02/2012, 9:22 PM

## 2012-06-02 NOTE — Discharge Planning (Signed)
06/02/2012  Pt did not attend d/c planning group on this date. SW met with pt individually at this time.   Pt stated she is doing better.  Pt stated continuing to be willing to follow up with [facility] upon discharge.    Clarice Pole, LCASA 06/06/2012, 10:06 AM

## 2012-06-03 DIAGNOSIS — F200 Paranoid schizophrenia: Secondary | ICD-10-CM | POA: Diagnosis not present

## 2012-06-03 NOTE — Progress Notes (Signed)
Pt laying in bed resting with eyes closed. Respirations even and unlabored. No distress noted.  

## 2012-06-03 NOTE — Progress Notes (Signed)
[name] is seen at Treatment  Team this AM. She is pleasant.Shy-appearing. Her language makes it quite difficult to understand her words. She did take her AM meds as ordered and she has gone to the cafe' for her meals thus far.               A She has not been inappropriate in any behaviors thus far.No aggressive behaviors.but will cont to work because language barrier can make comunication difficult, at best.                R Safety is in pale and will cont to medicate pt and assess pt's response PD RN [facility]

## 2012-06-03 NOTE — Progress Notes (Signed)
Writer met with patient who was up in hallway.  Patient unable to answer questions with clarity.  Writer spoke with counselor who advised she will be calling patient's niece for information on baseline.  Per State Regulation 482.30 This chart was reviewed for medical necessity with respect to the patient's  Admission/Duration of Stay  [facility], LCSW @8 /16/2013    Next Review Date

## 2012-06-03 NOTE — Progress Notes (Signed)
[facility] MD Progress Note  06/03/2012 7:02 PM  Diagnosis:  Axis I: Schizophrenia - Paranoid Type.   The patient was seen today and reports the following:   ADL's: Intact.  Sleep: The patient reports to once again having difficulty initiating and maintaining sleep.  However, it appears the patient is sleeping most of the day. Appetite: The patient reports that her appetite is good today.   Mild>(1-10) >Severe  Hopelessness (1-10): 0  Depression (1-10): 0  Anxiety (1-10): 0   Suicidal Ideation: The patient denies any current suicidal ideations today.  Plan: No  Intent: No  Means: No   Homicidal Ideation: The patient denies any homicidal ideations today.  Plan: No  Intent: No.  Means: No   General Appearance/Behavior: The patient was cooperative today with this provider with no agitation.  Eye Contact: Good.  Speech: Appropriate in rate and volume with no pressuring noted today.  Motor Behavior: wnl.  Level of Consciousness: Alert and Oriented x 3.  Mental Status: Alert and Oriented x 3.  Mood: Appears mildly depressed today.  Affect: Mildly constricted.  Anxiety Level: No anxiety reported today.  Thought Process: Psychotic.  Thought Content: The patient reports that she is experiencing some ongoing auditory hallucinations today which are improving. She denies any visual hallucinations today or delusional thinking.  Perception:. Psychotic.  Judgment: Fair.  Insight: Fair.  Cognition: Oriented to person, place and time.  Sleep:  Number of Hours: 2    Vital Signs:Blood pressure 160/82, pulse 80, temperature 97.8 F (36.6 C), temperature source Oral, resp. rate 16, height 5' 1.5" (1.562 m), weight 69.854 kg (154 lb), SpO2 100.00%.  Current Medications: Current Facility-Administered Medications  Medication Dose Route Frequency Provider Last Rate Last Dose  . acetaminophen (TYLENOL) tablet 650 mg  650 mg Oral Q6H PRN Verne Spurr, PA-C   650 mg at 05/30/12 0646  . alum & mag  hydroxide-simeth (MAALOX/MYLANTA) 200-200-20 MG/5ML suspension 30 mL  30 mL Oral Q4H PRN Verne Spurr, PA-C      . divalproex (DEPAKOTE ER) 24 hr tablet 1,500 mg  1,500 mg Oral QHS Curlene Labrum Maccoy Haubner, MD   1,500 mg at 06/02/12 2119  . fluticasone (FLONASE) 50 MCG/ACT nasal spray 2 spray  2 spray Each Nare Daily Curlene Labrum Iseah Plouff, MD   2 spray at 06/03/12 0839  . haloperidol lactate (HALDOL) injection 5 mg  5 mg Intramuscular Q6H PRN Curlene Labrum Vahan Wadsworth, MD      . hydrochlorothiazide (MICROZIDE) capsule 25 mg  25 mg Oral Daily Curlene Labrum Audianna Landgren, MD   25 mg at 06/03/12 1610  . irbesartan (AVAPRO) tablet 150 mg  150 mg Oral Daily Curlene Labrum Josselyne Onofrio, MD   150 mg at 06/03/12 0839  . LORazepam (ATIVAN) tablet 1 mg  1 mg Oral Q6H PRN Ronny Bacon, MD       Or  . LORazepam (ATIVAN) injection 1 mg  1 mg Intramuscular Q6H PRN Curlene Labrum Cydnie Deason, MD      . lurasidone (LATUDA) tablet 80 mg  80 mg Oral BH-qamhs Curlene Labrum Berkeley Vanaken, MD   80 mg at 06/03/12 0839  . magnesium hydroxide (MILK OF MAGNESIA) suspension 30 mL  30 mL Oral Daily PRN Verne Spurr, PA-C      . naproxen (NAPROSYN) tablet 500 mg  500 mg Oral BID WC Curlene Labrum Consuelo Suthers, MD   500 mg at 06/03/12 1712  . potassium chloride SA (K-DUR,KLOR-CON) CR tablet 20 mEq  20 mEq Oral Q breakfast Curlene Labrum  Shamari Lofquist, MD   20 mEq at 06/03/12 0839  . sertraline (ZOLOFT) tablet 50 mg  50 mg Oral Daily Curlene Labrum Harish Bram, MD   50 mg at 06/03/12 0838  . traZODone (DESYREL) tablet 100 mg  100 mg Oral QHS PRN Ronny Bacon, MD       Lab Results:  Results for orders placed during the hospital encounter of 05/21/12 (from the past 48 hour(s))  COMPREHENSIVE METABOLIC PANEL     Status: Abnormal   Collection Time   06/02/12  8:13 PM      Component Value Range Comment   Sodium 137  135 - 145 mEq/L    Potassium 3.8  3.5 - 5.1 mEq/L    Chloride 99  96 - 112 mEq/L    CO2 29  19 - 32 mEq/L    Glucose, Bld 95  70 - 99 mg/dL    BUN 13  6 - 23 mg/dL    Creatinine, Ser 4.69  0.50 -  1.10 mg/dL    Calcium 9.2  8.4 - 62.9 mg/dL    Total Protein 7.5  6.0 - 8.3 g/dL    Albumin 4.1  3.5 - 5.2 g/dL    AST 14  0 - 37 U/L    ALT 6  0 - 35 U/L    Alkaline Phosphatase 60  39 - 117 U/L    Total Bilirubin 0.2 (*) 0.3 - 1.2 mg/dL    GFR calc non Af Amer 64 (*) >90 mL/min    GFR calc Af Amer 74 (*) >90 mL/min    Physical Findings: AIMS: Facial and Oral Movements Muscles of Facial Expression: Mild Lips and Perioral Area: Mild Jaw: None, normal Tongue: Minimal,Extremity Movements Upper (arms, wrists, hands, fingers): None, normal Lower (legs, knees, ankles, toes): None, normal, Trunk Movements Neck, shoulders, hips: None, normal, Overall Severity Severity of abnormal movements (highest score from questions above): None, normal Incapacitation due to abnormal movements: None, normal Patient's awareness of abnormal movements (rate only patient's report): No Awareness, Dental Status Current problems with teeth and/or dentures?: Yes Does patient usually wear dentures?: Yes  CIWA:  CIWA-Ar Total: 0  COWS:  COWS Total Score: 0   Review of Systems:  Neurological: The patient denies any headaches today. She denies any seizures or dizziness.  G.I.: The patient denies any constipation today or G.I. Upset.  Musculoskeletal: The patient denies any muscle or skeletal difficulties.   Time was spent today discussing with the patient her current symptoms. The patient reports that she is continuing to have difficulty sleeping at night but appears to be continuing to sleep during the day. The patient reports a good appetite today and appears to be displaying ongoing mild feelings of sadness, anhedonia and depressed mood. The patient denies any suicidal or homicidal ideations and denies any anxiety symptoms. The patient denies any visual hallucinations or delusional thinking but does report auditory hallucinations which are improving. She denies any medication related side effects today.    Treatment Plan Summary:  1. Daily contact with patient to assess and evaluate symptoms and progress in treatment.  2. Medication management  3. The patient will deny suicidal ideations or homicidal ideations for 48 hours prior to discharge and have a depression and anxiety rating of 3 or less. The patient will also deny any auditory or visual hallucinations or delusional thinking.  4. The patient will deny any symptoms of substance withdrawal at time of discharge.   Plan:  1. Will continue the  medication Depakote ER at 1500 mgs po qhs for continued mood stabilization.  2. Will continue the medication Latuda at 80 mgs po q am and hs to continue to address her psychosis.  3. Will continue the medication Zoloft at 50 mgs po q am for depression.  4. Will continue the medication Haldol 5 mgs IM plus Ativan 1 mg po or IM q 6 hours - prn for agitation.  5. Will continue the medication Trazodone at 100 mgs po qhs - prn for sleep.  6. Will continue the medication HCTZ to 25 mgs po q am for blood pressure issues.  7. Laboratory studies reviewed.  8. Will continue to monitor.   Yohann Curl 06/03/2012, 7:02 PM

## 2012-06-03 NOTE — Progress Notes (Signed)
Patient ID: Kaylee Kelley, female   DOB: [birth year], 66 y.o.   MRN: [id number] Left message for patient's niece- Avon Gully [phone]) and [phone]). Requested that she visit patient over the weekend and talk to staff or call counselor with feedback about patient's progress and baseline. Reported possible discharge at the first of the week if patient continues to progress.

## 2012-06-03 NOTE — Progress Notes (Signed)
06/03/2012         Time: 0930      Group Topic/Focus: The focus of the group is on enhancing the patients' ability to cope with stressors by understanding what coping is, why it is important, the negative effects of stress and developing healthier coping skills. Patients practice [place] and discuss how exercise can be used as a healthy coping strategy.  Participation Level: Did not attend  Participation Quality: Not Applicable  Affect: Not Applicable  Cognitive: Not Applicable   Additional Comments: Patient refused group, first reporting she couldn't attend because the dayroom is cold, then saying she couldn't attend because she was nauseous. RT provided patient with her Friday workbook for unit programming after group.    Quanta Robertshaw 06/03/2012 12:05 PM

## 2012-06-03 NOTE — Progress Notes (Signed)
[facility] Group Notes:  (Counselor/Nursing/MHT/Case Management/Adjunct)  06/03/2012 2:40 PM  Type of Therapy:  Group Therapy  Participation Level:  Did Not Attend    Anthony Roland 06/03/2012, 2:40 PM 

## 2012-06-04 DIAGNOSIS — F200 Paranoid schizophrenia: Secondary | ICD-10-CM | POA: Diagnosis not present

## 2012-06-04 MED ORDER — ENSURE COMPLETE PO LIQD: 237.0000 mL | Freq: Two times a day (BID) | ORAL | Status: DC

## 2012-06-04 NOTE — Progress Notes (Signed)
Patient ID: Kaylee Kelley, female   DOB: January 05, 1946, 66 y.o.   MRN: [id number] 06-04-12 @ 1226: nursing shift note: D: pt is conversing with herself, using her hands and making gestures. A: rn attempted to orient pt and make her aware that she is talking with herself.  R: while rn is in the room she stops, but rn went back out and to her door listening and she continues to talk aloud to herself. rn will monitor and pt remains safe.

## 2012-06-04 NOTE — Progress Notes (Signed)
[facility] MD Progress Note                                         06/04/2012    Kaylee Kelley 1946/08/18    [id number] Hospital day #14  1. Schizophrenia, paranoid type   The patient was seen today and reports the following:  Sleep: "rested" Appetite: "wants to go to cafe'"  Mild>(1-10) >Severe  Depression (1-10):  0 Anxiety (1-10): "it's good." Hopelessness (1-10): 0  Suicidal Ideation: the patient denies suicidal ideation. Plan: None Intent: None Means:  None  Homicidal Ideation: the patient denies homicidal ideation. Plan: None Intent: None Means: None  Eye Contact:  good General Appearance: disheveled Behavior:  Cooperative  Motor Behavior: normal Speech: difficult to understand but some words are clear  Mental Status:  Orientation x 1/3 Level of Consciousness:   alert Mood: euthymic Affect: labile   Thought Process: disorganized Thought Content:  unclear Perception: impaired  Judgment: impaired Insight: lacking Cognition: unclear  VS: height is 5' 1.5" (1.562 m) and weight is 69.854 kg (154 lb). Kaylee Kelley oral temperature is 97.2 F (36.2 C). Kaylee Kelley blood pressure is 139/80 and Kaylee Kelley pulse is 99. Kaylee Kelley respiration is 18 and oxygen saturation is 100%.  Current Medication:   . divalproex  1,500 mg Oral QHS  . feeding supplement  237 mL Oral BID BM  . fluticasone  2 spray Each Nare Daily  . hydrochlorothiazide  25 mg Oral Daily  . irbesartan  150 mg Oral Daily  . lurasidone  80 mg Oral BH-qamhs  . naproxen  500 mg Oral BID WC  . potassium chloride  20 mEq Oral Q breakfast  . sertraline  50 mg Oral Daily    Lab results:No results found for this or any previous visit (from the past 48 hour(s)).  No results found for this or any previous visit for  Last 48 hours.   ROS:    Constitutional: WDWN Adult in NAD   GI: Negative for N,V,D,C   Neuro: Negative for dizziness, blurred vision, visual changes, headaches   Resp: Negative for wheezing, SOB, cough   Cardio:  Negative for CP, diaphoresis, fatigue   MSK: Negative for joint pain, swelling, DROM, or ambulatory difficulties.  Time was spent with the patient discussing the current symptoms and the response to treatment. She remains difficult to understand, but discusses my hair, and asks me questions about it. She tells me she doesn't know where she is from but is OK with this. It does not bother Kaylee Kelley. She does want to go to the cafe' to eat, but will not relinquish Kaylee Kelley bag of toiletries and underwear to go and eat.  Treatment Plan Summary:  1. Daily contact with patient to assess and evaluate symptoms and progress in treatment.  2. Medication management  3. The patient will deny suicidal ideations or homicidal ideations for 48 hours prior to discharge and have a depression and anxiety rating of 3 or less. The patient will also deny any auditory or visual hallucinations or delusional thinking.  4. The patient will deny any symptoms of substance withdrawal at time of discharge.  Plan:  1. Will continue the medication Depakote ER at 1500 mgs po qhs for continued mood stabilization.  2. Will continue the medication Latuda at 80 mgs po q am and hs to continue to address Kaylee Kelley psychosis.  3. Will continue the medication  Zoloft at 50 mgs po q am for depression.  4. Will continue the medication Haldol 5 mgs IM plus Ativan 1 mg po or IM q 6 hours - prn for agitation.  5. Will continue the medication Trazodone at 100 mgs po qhs - prn for sleep.  6. Will continue the medication HCTZ to 25 mgs po q am for blood pressure issues.  7. Laboratory studies reviewed.  8. Will continue to monitor.     Rona Ravens. Merrit Waugh [facility] 06/04/2012

## 2012-06-04 NOTE — Progress Notes (Signed)
Psychoeducational Group Note  Date:  06/04/2012 Time:1000am  Group Topic/Focus:  Identifying Needs:   The focus of this group is to help patients identify their personal needs that have been historically problematic and identify healthy behaviors to address their needs.  Participation Level:  Did Not Attend  Participation Quality:   Kaylee Kelley 06/04/2012,11:13 AM

## 2012-06-04 NOTE — Progress Notes (Signed)
Patient ID: Kaylee Kelley, female   DOB: [birth year], 66 y.o.   MRN: [id number] D. The patient was more appropriate this evening and pleasant. She came out of her and was interacting in the dayroom. Asked to watch cartoons on TV.  A. Encouraged to attend evening group. Administered HS medications. R. Did not attend evening group. Compliant with medications. Did not appear to be responding to auditory hallucinations this evening.

## 2012-06-04 NOTE — Progress Notes (Signed)
Psychoeducational Group Note  Date:  06/04/2012 Time:  2000  Group Topic/Focus:  Wrap-Up Group:   The focus of this group is to help patients review their daily goal of treatment and discuss progress on daily workbooks.  Participation Level:  Did Not Attend  Participation Quality:  Did not attend   Affect:  Did not attend   Cognitive:  Did not attend  Insight:  Did not attend  Engagement in Group:  Did not attend   Additional Comments:  Pt didn't attend wrap up group.   Deshane Cotroneo A 06/04/2012, 5:15 AM

## 2012-06-04 NOTE — Progress Notes (Signed)
[facility] Group Notes:  (Counselor/Nursing/MHT/Case Management/Adjunct)  06/04/2012 5:55 PM  Type of Therapy:  Group Therapy  Participation Level:  Did Not Attend   Summary of Progress/Problems: Pt did not attend group  Berlin Hun, MSW, LCSW 06/04/2012, 5:55 PM

## 2012-06-05 DIAGNOSIS — F200 Paranoid schizophrenia: Secondary | ICD-10-CM | POA: Diagnosis not present

## 2012-06-05 NOTE — Progress Notes (Signed)
Patient ID: Kaylee Kelley, female   DOB: 24-Sep-1946, 66 y.o.   MRN: [id number] 06-05-12 nursing shift note: D: pt came to medication window this am. A: rn discussed the schedule for the day and expectations. R: pt became agitated and is very resistent to care and following the schedule on the unit. After attempting to deescalate the pt, she turned around and went to her room. rn observed pt in her room and she asked the rn to leave her room. rn left the room. rn will monitor and pt remains safe.

## 2012-06-05 NOTE — Progress Notes (Signed)
Patient ID: Kaylee Kelley, female   DOB: 16-Oct-1946, 66 y.o.   MRN: [id number] D. The patient is more visible on the unit, but minimal engagement with others. Did not attend evening wrap up group. Occasionally heard yelling or talking to someone who is not there in her room.  A. 15 minute checks maintained for safety. Encouraged to attend group. Administered medications. R. The patient remains safe. Does not attend unit groups. Remains positive for auditory hallucinations. Compliant with medications.

## 2012-06-05 NOTE — Progress Notes (Signed)
Psychoeducational Group Note  Date:  06/05/2012 Time:  2000  Group Topic/Focus:  Goals Group:   The focus of this group is to help patients establish daily goals to achieve during treatment and discuss how the patient can incorporate goal setting into their daily lives to aide in recovery.  Participation Level:  Did Not Attend  Participation Quality:  Patient did not attend group  Affect:  Appropriate  Cognitive:  Alert  Insight:  None  Engagement in Group:  None  Additional Comments:  Patient did not come to group  Alven Alverio R 06/05/2012, 9:26 PM

## 2012-06-05 NOTE — Progress Notes (Signed)
[name]                                         06/05/2012    Kaylee Kelley Feb 08, 1946    [id number] Hospital day #15  1. Schizophrenia, paranoid type    The patient was seen today and reports the following:   Sleep:  Appetite:   Mild (1-10) Severe  Depression (1-10): Anxiety (1-10):  Hopelessness (1-10):   Suicidal Ideation: The patient denies suicidal ideation. Plan: None Intent: None Means: None  Homicidal Ideation: The patient denies homicidal ideation. Plan: None Intent: None Means: None  Eye Contact:   General Appearance: Behavior:  Cooperative  Motor Behavior:  Speech:  Mental Status:  Orientation x Level of Consciousness:   alert Mood:  Affect:   Thought Process: Thought Content:  Perception:  Judgment:  Insight: Cognition:  VS: height is 5' 1.5" (1.562 m) and weight is 69.854 kg (154 lb). Her oral temperature is 97.6 F (36.4 C). Her blood pressure is 136/80 and her pulse is 76. Her respiration is 16 and oxygen saturation is 100%.  Current Medication:   . divalproex  1,500 mg Oral QHS  . feeding supplement  237 mL Oral BID BM  . fluticasone  2 spray Each Nare Daily  . hydrochlorothiazide  25 mg Oral Daily  . irbesartan  150 mg Oral Daily  . lurasidone  80 mg Oral BH-qamhs  . naproxen  500 mg Oral BID WC  . potassium chloride  20 mEq Oral Q breakfast  . sertraline  50 mg Oral Daily    Lab results: No results found for this or any previous visit (from the past 48 hour(s)).   No results found for this or any previous visit for  Last 48 hours.   ROS:    Constitutional: WDWN Adult in NAD   GI: Negative for N,V,D,C   Neuro: Negative for dizziness, blurred vision, visual changes, headaches   Resp: Negative for wheezing, SOB, cough   Cardio: Negative for CP, diaphoresis, fatigue   MSK: Negative for joint pain, swelling, DROM, or ambulatory difficulties.  Time was spent with the patient discussing the current symptoms and  the response to treatment.  She is clearly angry today but will not make eye contact with me and will not speak.  She will only shake her head yes or no.  She nods in agreement that she will notify me if she is ready to discuss why she is upset.  Treatment Summary:  Treatment Plan:   [name] 06/05/2012

## 2012-06-05 NOTE — Progress Notes (Signed)
Patient ID: Kaylee Kelley, female   DOB: 06/30/1946, 66 y.o.   MRN: [id number] 06-05-12 @ 1617 nursing note: D: pt has been carrying around stretch underwear with her toiletries in the underwear. A: rn asked the pt why she continues to carry these items around. R she responded, but her speech is extremely hard to understand, but she appeared frustrated.  rn also advised her that she can not take them to the cafeteria for meals because it is unsanitary. rn also stated that she would have to have her meals on the unit if she insisted to take these items to the cafeteria. R: it seemed that she became angry. She refused to put the items away. It give her some type of security.

## 2012-06-05 NOTE — Progress Notes (Signed)
Psychoeducational Group Note  Date:  06/05/2012 Time:  1000am  Group Topic/Focus:  Making Healthy Choices:   The focus of this group is to help patients identify negative/unhealthy choices they were using prior to admission and identify positive/healthier coping strategies to replace them upon discharge.  Participation Level:  Did Not Attend  Participation Quality:06/05/2012,10:19 AM

## 2012-06-05 NOTE — Progress Notes (Signed)
[facility] Group Notes:  (Counselor/Nursing/MHT/Case Management/Adjunct)  06/05/2012 11 AM  Type of Therapy:  Aftercare Planning, Group Therapy, Dance/Movement Therapy   Participation Level:  Did not attend   [facility] 06/05/2012. 11:44 AM

## 2012-06-06 DIAGNOSIS — F200 Paranoid schizophrenia: Secondary | ICD-10-CM | POA: Diagnosis not present

## 2012-06-06 NOTE — Progress Notes (Signed)
[facility] Group Notes:  (Counselor/Nursing/MHT/Case Management/Adjunct)  06/06/2012 3:56 PM  Type of Therapy:  Group Therapy  Participation Level:  Did Not Attend     Kaylee Kelley 06/06/2012, 3:56 PM

## 2012-06-06 NOTE — Discharge Planning (Signed)
06/06/2012  Pt did not attend d/c planning group on this date. SW met with pt individually at this time.    Met with pt. In treatment team.  Pt advised of possible discharge tomorrow based on symptoms and observations reported by staff and her niece.  Pt. Appeared cheerful at thoughts of returning home if stable. SW to continue to assess for referrals.  Clarice Pole, LCASA 06/06/2012, 4:55 PM

## 2012-06-06 NOTE — Progress Notes (Signed)
Adult Services Patient-Family Contact/Session  Attendees:  Patient's niece [name]) [phone])  Goal(s):  Discussion of patient's baseline and progress  Safety Concerns:  None   Narrative:  She reported that patient had a bad day yesterday, but described the situation of where patient wanted to go to the cafeteria, but she was not allowed to go and take her things. She became suspicious that there was something with the food that they brought back. [name] stated that she has always been like this with wanting to do everything that somebody else is doing. She has also always been protective of her things. She did not think that the weekend situation was paranoia, but just patient's norm. She thinks she is doing well, and the two things she is concerned about is patient taking her medications and eating well.  Concerned about patient's missing teeth and stated that they had not taken them home. Counselor told her that this would be checked on. Also reported to niece that patient would most likely be discharged on Tuesday if she thought she was back to baseline. She will visit tonight and leave feedback for counselor.  Barrier(s):  None    Interventions:  Information for niece, support, discharge planning  Recommendation(s):  Out patient follow up and to live with niece.  Follow-up Required:  Yes  Explanation:  Report back to team after niece has corresponded with counselor. HartisAram Beecham 06/06/2012, 3:59 PM

## 2012-06-06 NOTE — Progress Notes (Signed)
06/06/2012         Time: 0930      Group Topic/Focus: The focus of this group is on promoting emotional and psychological well-being through the process of creative expression, relaxation, socialization, fun and enjoyment.  Participation Level: Did not attend  Participation Quality: Not Applicable  Affect: Not Applicable  Cognitive: Not Applicable   Additional Comments: Patient refused group.   Andriea Hasegawa 06/06/2012 10:18 AM  

## 2012-06-06 NOTE — Progress Notes (Signed)
[facility] MD Progress Note  06/06/2012 4:30 PM  Diagnosis:  Axis I: Schizophrenia - Paranoid Type.   The patient was seen today and reports the following:   ADL's: Intact.  Sleep: The patient reports to once again having difficulty initiating and maintaining sleep. However, it appears the patient is continuing to sleep during the day.  Appetite: The patient reports that her appetite is good today.   Mild>(1-10) >Severe  Hopelessness (1-10): 0  Depression (1-10): 0  Anxiety (1-10): 0   Suicidal Ideation: The patient denies any current suicidal ideations today.  Plan: No  Intent: No  Means: No   Homicidal Ideation: The patient denies any homicidal ideations today.  Plan: No  Intent: No.  Means: No   General Appearance/Behavior: The patient was cooperative today with this provider with no agitation.  Eye Contact: Good.  Speech: Appropriate in rate and volume with no pressuring noted today.  Motor Behavior: wnl.  Level of Consciousness: Alert and Oriented x 3.  Mental Status: Alert and Oriented x 3.  Mood: Appears mildly depressed today.  Affect: Mildly constricted.  Anxiety Level: No anxiety reported today.  Thought Process: Psychotic.  Thought Content: The patient reports that she is experiencing some ongoing mild auditory hallucinations today which are improving. She denies any visual hallucinations today or delusional thinking.  Perception:. Psychotic.  Judgment: Fair.  Insight: Fair.  Cognition: Oriented to person, place and time.  Sleep:  Number of Hours: 3.25    Vital Signs:Blood pressure 119/81, pulse 63, temperature 98.7 F (37.1 C), temperature source Oral, resp. rate 16, height 5' 1.5" (1.562 m), weight 69.854 kg (154 lb), SpO2 100.00%.  Current Medications: Current Facility-Administered Medications  Medication Dose Route Frequency Provider Last Rate Last Dose  . acetaminophen (TYLENOL) tablet 650 mg  650 mg Oral Q6H PRN Verne Spurr, PA-C   650 mg at 05/30/12 0646   . alum & mag hydroxide-simeth (MAALOX/MYLANTA) 200-200-20 MG/5ML suspension 30 mL  30 mL Oral Q4H PRN Verne Spurr, PA-C      . divalproex (DEPAKOTE ER) 24 hr tablet 1,500 mg  1,500 mg Oral QHS Curlene Labrum Imya Mance, MD   1,500 mg at 06/05/12 2136  . feeding supplement (ENSURE COMPLETE) liquid 237 mL  237 mL Oral BID BM Verne Spurr, PA-C      . fluticasone (FLONASE) 50 MCG/ACT nasal spray 2 spray  2 spray Each Nare Daily Curlene Labrum Makynlee Kressin, MD   2 spray at 06/06/12 0829  . haloperidol lactate (HALDOL) injection 5 mg  5 mg Intramuscular Q6H PRN Curlene Labrum Chapin Arduini, MD      . hydrochlorothiazide (MICROZIDE) capsule 25 mg  25 mg Oral Daily Curlene Labrum Mareli Antunes, MD   25 mg at 06/06/12 0828  . irbesartan (AVAPRO) tablet 150 mg  150 mg Oral Daily Curlene Labrum Carlissa Pesola, MD   150 mg at 06/06/12 0828  . LORazepam (ATIVAN) tablet 1 mg  1 mg Oral Q6H PRN Ronny Bacon, MD       Or  . LORazepam (ATIVAN) injection 1 mg  1 mg Intramuscular Q6H PRN Curlene Labrum Kasten Leveque, MD      . lurasidone (LATUDA) tablet 80 mg  80 mg Oral BH-qamhs Curlene Labrum Archer Moist, MD   80 mg at 06/06/12 0828  . magnesium hydroxide (MILK OF MAGNESIA) suspension 30 mL  30 mL Oral Daily PRN Verne Spurr, PA-C      . naproxen (NAPROSYN) tablet 500 mg  500 mg Oral BID WC Ronny Bacon, MD  500 mg at 06/06/12 0828  . potassium chloride SA (K-DUR,KLOR-CON) CR tablet 20 mEq  20 mEq Oral Q breakfast Curlene Labrum Ardyn Forge, MD   20 mEq at 06/06/12 0828  . sertraline (ZOLOFT) tablet 50 mg  50 mg Oral Daily Curlene Labrum Lis Savitt, MD   50 mg at 06/06/12 0828  . traZODone (DESYREL) tablet 100 mg  100 mg Oral QHS PRN Ronny Bacon, MD       Lab Results: No results found for this or any previous visit (from the past 48 hour(s)).  Physical Findings: AIMS: Facial and Oral Movements Muscles of Facial Expression: Mild Lips and Perioral Area: Mild Jaw: None, normal Tongue: Minimal,Extremity Movements Upper (arms, wrists, hands, fingers): None, normal Lower (legs, knees,  ankles, toes): None, normal, Trunk Movements Neck, shoulders, hips: None, normal, Overall Severity Severity of abnormal movements (highest score from questions above): None, normal Incapacitation due to abnormal movements: None, normal Patient's awareness of abnormal movements (rate only patient's report): No Awareness, Dental Status Current problems with teeth and/or dentures?: Yes Does patient usually wear dentures?: Yes  CIWA:  CIWA-Ar Total: 0  COWS:  COWS Total Score: 0   Review of Systems:  Neurological: The patient denies any headaches today. She denies any seizures or dizziness.  G.I.: The patient denies any constipation today or G.I. Upset.  Musculoskeletal: The patient denies any muscle or skeletal difficulties.   Time was spent today discussing with the patient her current symptoms. The patient reports that she is continuing to have difficulty sleeping at night but appears to be continuing to sleep during the day. The patient reports a good appetite today and appears to be displaying ongoing mild feelings of sadness, anhedonia and depressed mood. The patient denies any suicidal or homicidal ideations and denies any anxiety symptoms. The patient denies any visual hallucinations or delusional thinking but does report mild auditory hallucinations which are improving. She denies any medication related side effects today.  The patient's Niece is scheduled to visit the patient this evening and will report tomorrow if she feels the patient is at her baseline.  Treatment Plan Summary:  1. Daily contact with patient to assess and evaluate symptoms and progress in treatment.  2. Medication management  3. The patient will deny suicidal ideations or homicidal ideations for 48 hours prior to discharge and have a depression and anxiety rating of 3 or less. The patient will also deny any auditory or visual hallucinations or delusional thinking.  4. The patient will deny any symptoms of substance  withdrawal at time of discharge.   Plan:  1. Will continue the medication Depakote ER at 1500 mgs po qhs for continued mood stabilization.  2. Will continue the medication Latuda at 80 mgs po q am and hs to continue to address her psychosis.  3. Will continue the medication Zoloft at 50 mgs po q am for depression.  4. Will continue the medication Haldol 5 mgs IM plus Ativan 1 mg po or IM q 6 hours - prn for agitation.  5. Will continue the medication Trazodone at 100 mgs po qhs - prn for sleep.  6. Will continue the medication HCTZ to 25 mgs po q am for blood pressure issues.  7. Laboratory studies reviewed.  8. Will continue to monitor.   Kaylee Kelley 06/06/2012, 4:30 PM

## 2012-06-06 NOTE — Tx Team (Signed)
Interdisciplinary Treatment Plan Update (Adult) Date: 06/06/2012  Time Reviewed: 10:07 AM  Progress in Treatment: Attending groups: Yes Participating in groups: Yes Taking medication as prescribed: Yes Tolerating medication: Yes Family/Significant othe contact made: SW/counselor to make contact with Gordy Councilman Patient understands diagnosis: Yes Discussing patient identified problems/goals with staff: Yes Medical problems stabilized or resolved: Yes Denies suicidal/homicidal ideation: Yes Issues/concerns per patient self-inventory: None identified Other: N/A New problem(s) identified: None Identified Reason for Continuation of Hospitalization: Hallucinations Medication stabilization Interventions implemented related to continuation of hospitalization: mood stabilization, medication monitoring and adjustment, group therapy and psycho education, safety checks q 15 mins Additional comments: N/A Estimated length of stay:  Discharge Plan: SW is assessing for appropriate referrals.  New goal(s): N/A Review of initial/current patient goals per problem list:  1. Goal(s): Reduce depressive symptoms  Met: Yes   Target date: by discharge  As evidenced by: Reducing depression from a 10 to a 3 as reported by pt.  2. Goal (s): Eliminate Suicidal Ideation  Met: Yes  Target date: by discharge  As evidenced by: Self report from pt. 3. Goal(s): Reduce Psychosis  Met: No  Target date: by discharge  As evidenced by: Reduce psychotic symptoms to baseline, as reported by pt. Pt states she continues to hear voices, but only a little. 4. Goal(s): After Care Plan  Met: Yes  Target date: by discharge  As evidenced by: Pt to follow-up with Vesta Mixer Attendees: Patient: Kaylee Kelley   Family:    Physician: Franchot Gallo, MD 06/06/2012 10:07 AM   Nursing:    Case Manager: Clarice Pole, LCASA 06/06/2012 10:07 AM   Counselor: Veto Kemps, MT-BC 06/06/2012 10:07 AM   Other: Joslyn Devon, RN 06/06/2012 10:07 AM   Other:    Other:    Other:    Scribe for Treatment Team:  Clarice Pole, LCASA 06/06/2012 10:07 AM

## 2012-06-06 NOTE — Progress Notes (Signed)
Patient ID: Kaylee Kelley, female   DOB: [birth year], 66 y.o.   MRN: [id number] D. The patient 's niece visited and requested to speak to staff. She is concerned regarding the location of the patient's dentures. She claims the patient had them here upon admission. Stated that her aunt will return to her home after discharge. Reports a long history of mental illness. Feels this is close to her aunt's mental base line. The patient did not go to group, but did come into the dayroom for snack. Minimal interaction in the milieu. A. Q 15 minute checks maintained for safety. Administered HS medications. R. Compliant with medications. Did not have any bouts of yelling or talking to self this evening.

## 2012-06-06 NOTE — Progress Notes (Signed)
Patient continues to isolate to room and not attend any groups on the unit.  She does, however, go to the cafeteria occasionally.  She continues to carry around her toiletries in her underwear bag.  CW will talk with her niece today to see if patient is well enough to possibly be discharged home tomorrow.  She has improved considerably since her admission here.  She was up this morning, made up her bed, and took all of her medications.  Patient likes to take a peppermint after her medications as they make her spit up.  She has likes to have the larger medications split in half.  She denies any SI/HI.  She stated during treatment team meeting that she is still hearing some voices.  Continue to monitor medication management and MD orders.  Collaborate with treatment team members regarding patient's POC.  Safety checks continues every 15 minutes.  Patient has been calm and cooperative; her behavior has been appropriate.  Encourage patient to continue with her medication compliance.

## 2012-06-07 DIAGNOSIS — F200 Paranoid schizophrenia: Secondary | ICD-10-CM | POA: Diagnosis not present

## 2012-06-07 MED ORDER — HYDROCHLOROTHIAZIDE 25 MG PO TABS: 25.0000 mg | ORAL_TABLET | Freq: Every day | ORAL | Status: DC

## 2012-06-07 MED ORDER — IRBESARTAN 150 MG PO TABS: 150.0000 mg | ORAL_TABLET | Freq: Every day | ORAL | Status: DC

## 2012-06-07 MED ORDER — HYDROCHLOROTHIAZIDE 25 MG PO TABS
25.0000 mg | ORAL_TABLET | Freq: Every day | ORAL | Status: DC
  Administered 2012-06-08: 25 mg via ORAL
  Filled 2012-06-07 (×2): qty 14

## 2012-06-07 MED ORDER — LURASIDONE HCL 80 MG PO TABS: 80.0000 mg | ORAL_TABLET | ORAL | Status: DC

## 2012-06-07 MED ORDER — DIVALPROEX SODIUM ER 500 MG PO TB24: 1500.0000 mg | ORAL_TABLET | Freq: Every day | ORAL | Status: DC

## 2012-06-07 MED ORDER — SERTRALINE HCL 50 MG PO TABS: 50.0000 mg | ORAL_TABLET | Freq: Every day | ORAL | Status: DC

## 2012-06-07 MED ORDER — POTASSIUM CHLORIDE CRYS ER 20 MEQ PO TBCR: 20.0000 meq | EXTENDED_RELEASE_TABLET | Freq: Every day | ORAL | Status: DC

## 2012-06-07 MED ORDER — FLUTICASONE PROPIONATE 50 MCG/ACT NA SUSP: 2.0000 | Freq: Every day | NASAL | Status: DC

## 2012-06-07 MED ORDER — NAPROXEN 500 MG PO TABS: 500.0000 mg | ORAL_TABLET | Freq: Two times a day (BID) | ORAL | Status: DC

## 2012-06-07 NOTE — Progress Notes (Signed)
Patient remains isolative to room.  Niece is to visit tonight.  The main concerns are the patient taking her medications and eating her food.  Patient may be a possible discharge this evening if the niece feels comfortable taking her home.  She does feel the patient is close to her baseline.  Patient denies any SI/HI.  She still have some AH.  At times, she is talking to herself in her room.  Staff is still checking on her missing teeth.  Patient did not secure a locker when she came over from [facility], so it is uncertain whether she had any belongings.    Continue to monitor medication management and MD orders.  Collaborate with treatment team members regarding patient's POC.  Safety checks checks continued every 15 minutes.    Reassure patient of her safety.  Encourage patient to participate in her care as much as possible.  Patient's behavior is appropriate at this time.

## 2012-06-07 NOTE — Progress Notes (Signed)
[facility] Group Notes:  (Counselor/Nursing/MHT/Case Management/Adjunct)  06/07/2012 3:07 PM  Type of Therapy:  Group Therapy  Participation Level:  Did Not Attend  :   Kaylee Kelley 06/07/2012, 3:07 PM 

## 2012-06-07 NOTE — Progress Notes (Signed)
Psychoeducational Group Note  Date:  06/07/2012 Time:  2000  Group Topic/Focus:  Wrap-Up Group:   The focus of this group is to help patients review their daily goal of treatment and discuss progress on daily workbooks.  Participation Level:  Active  Participation Quality:  Appropriate  Affect:  Appropriate  Cognitive:  Appropriate  Insight:  Good  Engagement in Group:  Good  Additional Comments:  Patient attended and participated in group tonight. She reports that her day went well. She eat, sleep, took her medications and had visitors. She advised that she takes care of her wellness by taking baths, socializing and eating well.  Lita Mains [facility] 06/07/2012, 2:30 AM

## 2012-06-07 NOTE — Progress Notes (Signed)
Psychoeducational Group Note  Date:  06/07/2012 Time:  2000  Group Topic/Focus:  Wrap-Up Group:   The focus of this group is to help patients review their daily goal of treatment and discuss progress on daily workbooks.  Participation Level:  Active  Participation Quality:  Appropriate  Affect:  Appropriate  Cognitive:  Appropriate  Insight:  Good  Engagement in Group:  Good  Additional Comments:  Patient attended and participated in group tonight. She reported that today she eat, sleep, took her medication and had a visitor. She take care of her wellness by socializing and eating well.  Lita Mains [facility] 06/07/2012, 1:52 AM

## 2012-06-07 NOTE — Progress Notes (Signed)
[facility] Case Management Discharge Plan:  Will you be returning to the same living situation after discharge: Yes,    At discharge, do you have transportation home?:Yes,    Do you have the ability to pay for your medications:Yes,     Interagency Information:     Release of information consent forms completed and in the chart;  Patient's signature needed at discharge.  Patient to Follow up at:  Follow-up Information    Follow up with [facility] on 06/08/2012. (Please follow-up with the walk-in clinic M-F between 8:30am and 3:30pm.)    Contact information:   [address] [place], [place] [address] [phone]         Patient denies SI/HI:   Yes,       Safety Planning and Suicide Prevention discussed:  Yes,     Barrier to discharge identified:No.   Summary and Recommendations:   Kaylee Kelley 06/07/2012, 3:31 PM

## 2012-06-07 NOTE — Progress Notes (Signed)
D: Patient resting in bed with cover over her head.  Cover was pulled back to exposed patients face.  Patient resting in bed with eyes closed.  Respirations even and unlabored. Patient did not open eyes during assessment.  Patient in no apparent distress. A: Staff to monitor Q 15 mins for safety R: Patient remains safe on the unit

## 2012-06-07 NOTE — Progress Notes (Signed)
D: Patient in room sitting in a chair on approach.  Patient tearful and talking to herself.  Patient states to Clinical research associate that she is upset because she cannot get in touch with her niece who was supposed to take her home today. Patient denies SI/HI and denies AVH however patient appears to be responding to internal stimuli.   A: Staff to monitor Q 15 mins for safety.  Support and encouragement given.  Patient's niece called so patient could speak with her.  Scheduled medication administered per orders. Trazodone administered prn at bedtime for insomnia. R: Patient remains safe on the unit. Patient's niece called and stated it was a misunderstanding about when she would take patient home.  Patient's niece states that she will be here tomorrow morning at 9:00AM to take patient home.  Patient still talking to herself however she is more calm and not upset or tearful.  Patient did not attend group.  Patient taking administered all administered medication. Patient resting in room trying to go to sleep.
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
## 2012-06-07 NOTE — Discharge Planning (Signed)
06/07/2012  Pt did not attend d/c planning group on this date. SW met with pt individually at this time.   Met with pt in treatment team.  Pt. stated she had a visit with her niece on last night.  Pt. Stated she slept well and her Niece was bringing clothes to pick her up today later this evening.  SW discussed follow-up with [facility] for medication management.    Clarice Pole, LCASA 06/07/2012, 3:11 PM

## 2012-06-07 NOTE — Progress Notes (Signed)
Psychoeducational Group Note  Date:  06/07/2012 Time:  1100  Group Topic/Focus:  Recovery Goals:   The focus of this group is to identify appropriate goals for recovery and establish a plan to achieve them.  Participation Level: Did Not Attend  Participation Quality:  Not Applicable  Affect:  Not Applicable  Cognitive:  Not Applicable  Insight:  Not Applicable  Engagement in Group: Not Applicable  Additional Comments:  Pt refused to attend group this morning.   Jakarius Flamenco E 06/07/2012, 1:50 PM

## 2012-06-07 NOTE — Tx Team (Signed)
Interdisciplinary Treatment Plan Update (Adult) Date: 06/07/2012  Time Reviewed: 10:07 AM  Progress in Treatment:  Attending groups: Yes  Participating in groups: Yes  Taking medication as prescribed: Yes  Tolerating medication: Yes  Family/Significant othe contact made: Yes, counselor spoke with pt niece Patient understands diagnosis: Yes  Discussing patient identified problems/goals with staff: Yes  Medical problems stabilized or resolved: Yes  Denies suicidal/homicidal ideation: Yes  Issues/concerns per patient self-inventory: None identified  Other: N/A  New problem(s) identified: None Identified  Reason for Continuation of Hospitalization:  Discharge Today Interventions implemented related to continuation of hospitalization: mood stabilization, medication monitoring and adjustment, group therapy and psycho education, safety checks q 15 mins  Additional comments: N/A  Estimated length of stay:  Discharge Plan: SW is assessing for appropriate referrals.  New goal(s): N/A  Review of initial/current patient goals per problem list:  1. Goal(s): Reduce depressive symptoms   Met: Yes   Target date: by discharge   As evidenced by: Reducing depression from a 10 to a 3 as reported by pt.  2. Goal (s): Medication Stabilization  Met: Yes   Target date: by discharge   As evidenced by: Monitoring and Adjustments completed by Physician symptoms to baseline based on feedback from pt and her niece.  3. Goal(s): Reduce Psychosis   Met: Yes  Target date: by discharge   As evidenced by: Reduce psychotic symptoms to baseline, as reported by pt. Pt states she continues to hear voices, but only a little.  4. Goal(s): After Care Plan   Met: Yes   Target date: by discharge   As evidenced by: Pt to follow-up with [facility] Attendees: Patient: Kaylee Kelley  06/07/2012  Family:    Physician: Franchot Gallo, MD 06/07/2012 3:06 PM   Nursing:    Case Manager: Clarice Pole, LCASA  06/07/2012 3:06 PM   Counselor: Veto Kemps, MT-BC 06/07/2012 3:06 PM   Other: Ambrose Mantle, LCSW 06/07/2012 3:06 PM   Other:    Other:    Other:    Scribe for Treatment Team:  Clarice Pole, LCASA 06/07/2012 3:06 PM

## 2012-06-08 DIAGNOSIS — F200 Paranoid schizophrenia: Secondary | ICD-10-CM | POA: Diagnosis not present
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
## 2012-06-08 NOTE — Progress Notes (Signed)
Pt discharged per MD orders; pt currently denies SI/HI and auditory/visual hallucinations; pt was given education by RN regarding follow-up appointments and medications and pt denied any questions or concerns about these instructions; ensured that pt was discharged with all belongings (including dentures); pt then discharged to hospital lobby.

## 2012-06-10 NOTE — Progress Notes (Signed)
After Visit Summary (AVS) Faxed to: 06/10/2012 Psychiatric Admission Assessment Note Faxed to: 06/10/2012 Suicide Risk Assessment-Discharge Assessment Faxed to: 06/10/2012  Faxed to: [address], [phone]  Emilio Aspen, 06/10/2012, 11:45AM

## 2012-06-30 DIAGNOSIS — F209 Schizophrenia, unspecified: Secondary | ICD-10-CM | POA: Diagnosis not present
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
## 2012-08-24 DIAGNOSIS — J3089 Other allergic rhinitis: Secondary | ICD-10-CM | POA: Diagnosis not present

## 2012-08-24 DIAGNOSIS — Z23 Encounter for immunization: Secondary | ICD-10-CM | POA: Diagnosis not present

## 2012-08-24 DIAGNOSIS — E119 Type 2 diabetes mellitus without complications: Secondary | ICD-10-CM | POA: Diagnosis not present

## 2012-08-24 DIAGNOSIS — I10 Essential (primary) hypertension: Secondary | ICD-10-CM | POA: Diagnosis not present

## 2012-09-17 ENCOUNTER — Emergency Department (HOSPITAL_COMMUNITY)
Admission: EM | Admit: 2012-09-17 | Discharge: 2012-09-18 | Disposition: A | Discharge status: Home / Self Care | Payer: Medicare Other | Source: Home / Self Care | Attending: Emergency Medicine | Admitting: Emergency Medicine

## 2012-09-17 ENCOUNTER — Encounter (HOSPITAL_COMMUNITY): Payer: Self-pay | Admitting: *Deleted

## 2012-09-17 DIAGNOSIS — Z862 Personal history of diseases of the blood and blood-forming organs and certain disorders involving the immune mechanism: Secondary | ICD-10-CM | POA: Insufficient documentation

## 2012-09-17 DIAGNOSIS — Z79899 Other long term (current) drug therapy: Secondary | ICD-10-CM | POA: Insufficient documentation

## 2012-09-17 DIAGNOSIS — F29 Unspecified psychosis not due to a substance or known physiological condition: Secondary | ICD-10-CM | POA: Insufficient documentation

## 2012-09-17 DIAGNOSIS — Z8639 Personal history of other endocrine, nutritional and metabolic disease: Secondary | ICD-10-CM | POA: Insufficient documentation

## 2012-09-17 DIAGNOSIS — F319 Bipolar disorder, unspecified: Secondary | ICD-10-CM | POA: Insufficient documentation

## 2012-09-17 DIAGNOSIS — I10 Essential (primary) hypertension: Secondary | ICD-10-CM | POA: Insufficient documentation

## 2012-09-17 DIAGNOSIS — IMO0002 Reserved for concepts with insufficient information to code with codable children: Secondary | ICD-10-CM | POA: Insufficient documentation

## 2012-09-17 DIAGNOSIS — F209 Schizophrenia, unspecified: Secondary | ICD-10-CM | POA: Diagnosis not present

## 2012-09-17 HISTORY — DX: Schizophrenia, unspecified: F20.9

## 2012-09-17 LAB — URINALYSIS, ROUTINE W REFLEX MICROSCOPIC
Bilirubin Urine: NEGATIVE
Hgb urine dipstick: NEGATIVE
Ketones, ur: NEGATIVE mg/dL
Nitrite: NEGATIVE
Urobilinogen, UA: 0.2 mg/dL (ref 0.0–1.0)

## 2012-09-17 LAB — ETHANOL: Alcohol, Ethyl (B): <11 mg/dL (ref 0–11)

## 2012-09-17 LAB — CBC
HCT: 27.8 % — ABNORMAL LOW (ref 36.0–46.0)
MCH: 28.3 pg (ref 26.0–34.0)
MCV: 81 fL (ref 78.0–100.0)
Platelets: 213 10*3/uL (ref 150–400)
RBC: 3.43 MIL/uL — ABNORMAL LOW (ref 3.87–5.11)

## 2012-09-17 LAB — SALICYLATE LEVEL: Salicylate Lvl: <2 mg/dL — ABNORMAL LOW (ref 2.8–20.0)

## 2012-09-17 LAB — COMPREHENSIVE METABOLIC PANEL
BUN: 14 mg/dL (ref 6–23)
CO2: 26 mEq/L (ref 19–32)
Calcium: 9.9 mg/dL (ref 8.4–10.5)
Creatinine, Ser: 0.82 mg/dL (ref 0.50–1.10)
GFR calc Af Amer: 86 mL/min — ABNORMAL LOW (ref >90)
GFR calc non Af Amer: 74 mL/min — ABNORMAL LOW (ref >90)
Glucose, Bld: 180 mg/dL — ABNORMAL HIGH (ref 70–99)
Total Bilirubin: 0.2 mg/dL — ABNORMAL LOW (ref 0.3–1.2)

## 2012-09-17 LAB — ACETAMINOPHEN LEVEL: Acetaminophen (Tylenol), Serum: <15 ug/mL (ref 10–30)

## 2012-09-17 MED ORDER — DIVALPROEX SODIUM ER 500 MG PO TB24
1500.0000 mg | ORAL_TABLET | Freq: Every day | ORAL | Status: DC
  Administered 2012-09-17: 1500 mg via ORAL
  Filled 2012-09-17 (×2): qty 3

## 2012-09-17 MED ORDER — HYDROCHLOROTHIAZIDE 25 MG PO TABS
25.0000 mg | ORAL_TABLET | Freq: Every day | ORAL | Status: DC
  Administered 2012-09-17 – 2012-09-18 (×2): 25 mg via ORAL
  Filled 2012-09-17 (×2): qty 1

## 2012-09-17 MED ORDER — IRBESARTAN 150 MG PO TABS
150.0000 mg | ORAL_TABLET | Freq: Every day | ORAL | Status: DC
  Administered 2012-09-17 – 2012-09-18 (×2): 150 mg via ORAL
  Filled 2012-09-17 (×2): qty 1

## 2012-09-17 MED ORDER — LURASIDONE HCL 80 MG PO TABS
80.0000 mg | ORAL_TABLET | ORAL | Status: DC
  Administered 2012-09-17 – 2012-09-18 (×2): 80 mg via ORAL
  Filled 2012-09-17 (×4): qty 1

## 2012-09-17 MED ORDER — ZIPRASIDONE MESYLATE 20 MG IM SOLR
10.0000 mg | Freq: Once | INTRAMUSCULAR | Status: AC
  Administered 2012-09-17: 10 mg via INTRAMUSCULAR
  Filled 2012-09-17: qty 20

## 2012-09-17 MED ORDER — TRAZODONE HCL 100 MG PO TABS
100.0000 mg | ORAL_TABLET | Freq: Every day | ORAL | Status: DC
  Administered 2012-09-17: 100 mg via ORAL
  Filled 2012-09-17: qty 1

## 2012-09-17 MED ORDER — POTASSIUM CHLORIDE CRYS ER 20 MEQ PO TBCR
20.0000 meq | EXTENDED_RELEASE_TABLET | Freq: Every day | ORAL | Status: DC
  Administered 2012-09-18: 20 meq via ORAL
  Filled 2012-09-17: qty 1

## 2012-09-17 MED ORDER — FLUTICASONE PROPIONATE 50 MCG/ACT NA SUSP
2.0000 | Freq: Every day | NASAL | Status: DC
  Administered 2012-09-18: 2 via NASAL
  Filled 2012-09-17: qty 16

## 2012-09-17 MED ORDER — SERTRALINE HCL 50 MG PO TABS
50.0000 mg | ORAL_TABLET | Freq: Every day | ORAL | Status: DC
  Administered 2012-09-17 – 2012-09-18 (×2): 50 mg via ORAL
  Filled 2012-09-17 (×2): qty 1

## 2012-09-17 MED ORDER — ACETAMINOPHEN 500 MG PO TABS
1000.0000 mg | ORAL_TABLET | Freq: Four times a day (QID) | ORAL | Status: DC | PRN
  Administered 2012-09-17: 1000 mg via ORAL
  Filled 2012-09-17: qty 2

## 2012-09-17 NOTE — ED Notes (Signed)
Pt's family member states that "she had a break"  Pt is not understandable and rambles and talks very fast and loud, family states she has been throwing things and breaking them at home and refuses to eat and has lost a lot of weight,  Unknown amount.   Pt denies SI and HI  Has been hospitalized for psychotic break before per family
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
## 2012-09-17 NOTE — ED Provider Notes (Signed)
History     CSN: [id number]  Arrival date & time 09/17/12  2029   First MD Initiated Contact with Patient 09/17/12 2110      Chief Complaint  Patient presents with  . Medical Clearance    (Consider location/radiation/quality/duration/timing/severity/associated sxs/prior treatment) HPI Patient presents with multiple family members who provide the history of present illness.  According to the family members the patient has become increasingly agitated over the past few days, now nearly entirely disconnected, disoriented, agitated, with anorexia, violently outbursts. The patient has a history of schizophrenia as well as bipolar disorder, and has occasional episodes, but this event has been more severe and longer lasting than a typical event.  The patient's family members deny any new physical concerns, such as cough, fever, vomiting, diarrhea. The patient herself makes repetitive statements, nonsensically, is incapable of providing any significant aspects of the history of present illness. Past Medical History  Diagnosis Date  . Hypertension   . Pre-diabetes   . Bipolar affective disorder   . Schizophrenic disorder     No past surgical history on file.  Family History  Problem Relation Age of Onset  . Hypertension Mother   . Colon cancer Mother   . Diabetes Mother   . Hypertension Father     History  Substance Use Topics  . Smoking status: Never Smoker   . Smokeless tobacco: Not on file  . Alcohol Use: No     Comment: Pt denies     OB History    Grav Para Term Preterm Abortions TAB SAB Ect Mult Living                  Review of Systems  Unable to perform ROS: Psychiatric disorder    Allergies  Review of patient's allergies indicates no known allergies.  Home Medications   Current Outpatient Rx  Name  Route  Sig  Dispense  Refill  . ACETAMINOPHEN 500 MG PO TABS   Oral   Take 1,000 mg by mouth every 6 (six) hours as needed.         Marland Kitchen DIVALPROEX SODIUM  ER 500 MG PO TB24   Oral   Take 3 tablets (1,500 mg total) by mouth at bedtime. For mood stabilization.   90 tablet   0   . FLUTICASONE PROPIONATE 50 MCG/ACT NA SUSP   Nasal   Place 2 sprays into the nose daily. For seasonal allergies.   16 g   0   . HYDROCHLOROTHIAZIDE 25 MG PO TABS   Oral   Take 1 tablet (25 mg total) by mouth daily. For blood pressure control.   30 tablet   0   . IRBESARTAN 150 MG PO TABS   Oral   Take 1 tablet (150 mg total) by mouth daily. For blood pressure control.   30 tablet   0   . LURASIDONE HCL 80 MG PO TABS   Oral   Take 1 tablet (80 mg total) by mouth 2 (two) times daily in the am and at bedtime.. For psychosis.   60 tablet   0   . NOREL CS PO   Oral   Take 1 tablet by mouth daily.         Marland Kitchen POTASSIUM CHLORIDE CRYS ER 20 MEQ PO TBCR   Oral   Take 1 tablet (20 mEq total) by mouth daily with breakfast. For hypokalemia.   30 tablet   0   . SERTRALINE HCL 50 MG PO TABS  Oral   Take 1 tablet (50 mg total) by mouth daily. For depression and anxiety.   30 tablet   0   . TRAZODONE HCL 100 MG PO TABS   Oral   Take 100 mg by mouth at bedtime.           BP 133/70  Pulse 77  Temp 98.8 F (37.1 C) (Oral)  Resp 18  SpO2 97%  Physical Exam  Nursing note and vitals reviewed. Constitutional: She is oriented to person, place, and time. She appears well-developed and well-nourished. No distress.  HENT:  Head: Normocephalic and atraumatic.  Eyes: Conjunctivae normal and EOM are normal.  Cardiovascular: Normal rate and regular rhythm.   Pulmonary/Chest: Effort normal and breath sounds normal. No stridor. No respiratory distress.  Abdominal: She exhibits no distension.  Musculoskeletal: She exhibits no edema.  Neurological: She is alert and oriented to person, place, and time. No cranial nerve deficit.  Skin: Skin is warm and dry.  Psychiatric: Her mood appears anxious. Her speech is rapid and/or pressured and tangential. She is  agitated, is hyperactive and actively hallucinating. Thought content is delusional. Cognition and memory are impaired. She expresses impulsivity. She exhibits abnormal recent memory and abnormal remote memory. She is inattentive.    ED Course  Procedures (including critical care time)   Labs Reviewed  CBC  COMPREHENSIVE METABOLIC PANEL  ETHANOL  ACETAMINOPHEN LEVEL  SALICYLATE LEVEL   No results found.   No diagnosis found.    MDM  This elderly female with schizophrenia, bipolar disorder presents due to concerns of increasing agitated behavior, new agression, uncontrollable actions.  On my exam the patient is anxious, disconnected.  She is medically clear for further psychiatric evaluation.  To expedite the patient's care, minimize her progression, she received sedatives.      Gerhard Munch, MD 09/17/12 2136

## 2012-09-17 NOTE — ED Notes (Signed)
Pt wanded by security and placed in blue scrubs,  Family is at bedside

## 2012-09-17 NOTE — ED Notes (Signed)
Urine collection sent to lab

## 2012-09-18 ENCOUNTER — Inpatient Hospital Stay (HOSPITAL_COMMUNITY)
Admission: AD | Admit: 2012-09-18 | Discharge: 2012-09-23 | DRG: 885 | Disposition: A | Discharge status: Home / Self Care | Payer: Medicare Other | Source: Other Acute Inpatient Hospital | Attending: Psychiatry | Admitting: Psychiatry

## 2012-09-18 ENCOUNTER — Encounter (HOSPITAL_COMMUNITY): Payer: Self-pay

## 2012-09-18 DIAGNOSIS — I10 Essential (primary) hypertension: Secondary | ICD-10-CM | POA: Diagnosis not present

## 2012-09-18 DIAGNOSIS — F29 Unspecified psychosis not due to a substance or known physiological condition: Secondary | ICD-10-CM | POA: Diagnosis not present

## 2012-09-18 DIAGNOSIS — F200 Paranoid schizophrenia: Secondary | ICD-10-CM | POA: Diagnosis not present

## 2012-09-18 DIAGNOSIS — R7309 Other abnormal glucose: Secondary | ICD-10-CM | POA: Diagnosis not present

## 2012-09-18 DIAGNOSIS — F259 Schizoaffective disorder, unspecified: Secondary | ICD-10-CM | POA: Diagnosis not present

## 2012-09-18 DIAGNOSIS — F250 Schizoaffective disorder, bipolar type: Secondary | ICD-10-CM

## 2012-09-18 DIAGNOSIS — Z79899 Other long term (current) drug therapy: Secondary | ICD-10-CM

## 2012-09-18 DIAGNOSIS — F209 Schizophrenia, unspecified: Secondary | ICD-10-CM | POA: Diagnosis not present

## 2012-09-18 LAB — RAPID URINE DRUG SCREEN, HOSP PERFORMED
Barbiturates: NOT DETECTED
Tetrahydrocannabinol: NOT DETECTED

## 2012-09-18 MED ORDER — MAGNESIUM HYDROXIDE 400 MG/5ML PO SUSP: 30.0000 mL | Freq: Every day | ORAL | Status: DC | PRN

## 2012-09-18 MED ORDER — IRBESARTAN 150 MG PO TABS
150.0000 mg | ORAL_TABLET | Freq: Every day | ORAL | Status: DC
  Administered 2012-09-19 – 2012-09-23 (×5): 150 mg via ORAL
  Filled 2012-09-18 (×7): qty 1

## 2012-09-18 MED ORDER — POTASSIUM CHLORIDE CRYS ER 20 MEQ PO TBCR
40.0000 meq | EXTENDED_RELEASE_TABLET | Freq: Once | ORAL | Status: AC
  Administered 2012-09-18: 40 meq via ORAL
  Filled 2012-09-18: qty 2

## 2012-09-18 MED ORDER — ALUM & MAG HYDROXIDE-SIMETH 200-200-20 MG/5ML PO SUSP: 30.0000 mL | ORAL | Status: DC | PRN

## 2012-09-18 MED ORDER — TRAZODONE HCL 100 MG PO TABS
100.0000 mg | ORAL_TABLET | Freq: Every day | ORAL | Status: DC
  Administered 2012-09-18 – 2012-09-20 (×3): 100 mg via ORAL
  Filled 2012-09-18 (×4): qty 1

## 2012-09-18 MED ORDER — SERTRALINE HCL 50 MG PO TABS
50.0000 mg | ORAL_TABLET | Freq: Every day | ORAL | Status: DC
  Administered 2012-09-18 – 2012-09-21 (×4): 50 mg via ORAL
  Filled 2012-09-18 (×5): qty 1

## 2012-09-18 MED ORDER — HYDROCHLOROTHIAZIDE 25 MG PO TABS
25.0000 mg | ORAL_TABLET | Freq: Every day | ORAL | Status: DC
  Administered 2012-09-19 – 2012-09-23 (×4): 25 mg via ORAL
  Filled 2012-09-18 (×7): qty 1

## 2012-09-18 MED ORDER — DIVALPROEX SODIUM ER 500 MG PO TB24
1500.0000 mg | ORAL_TABLET | Freq: Every day | ORAL | Status: DC
  Administered 2012-09-18 – 2012-09-19 (×2): 1500 mg via ORAL
  Administered 2012-09-20: 500 mg via ORAL
  Filled 2012-09-18 (×4): qty 3

## 2012-09-18 MED ORDER — LURASIDONE HCL 80 MG PO TABS
80.0000 mg | ORAL_TABLET | Freq: Two times a day (BID) | ORAL | Status: DC
  Administered 2012-09-18 – 2012-09-23 (×11): 80 mg via ORAL
  Filled 2012-09-18 (×15): qty 1

## 2012-09-18 MED ORDER — ACETAMINOPHEN 325 MG PO TABS
650.0000 mg | ORAL_TABLET | Freq: Four times a day (QID) | ORAL | Status: DC | PRN
  Administered 2012-09-20 – 2012-09-21 (×2): 650 mg via ORAL

## 2012-09-18 MED ORDER — LORAZEPAM 1 MG PO TABS
1.0000 mg | ORAL_TABLET | Freq: Once | ORAL | Status: DC
  Filled 2012-09-18: qty 1

## 2012-09-18 NOTE — ED Notes (Signed)
Up to the bathroom 

## 2012-09-18 NOTE — ED Notes (Signed)
[employer] here to serve and transport

## 2012-09-18 NOTE — ED Notes (Signed)
No belongings located

## 2012-09-18 NOTE — [facility] Counselor (Signed)
Patient has been accepted at [facility] by Nanine Means NP to the services of Dr.Akintayo room 403.2

## 2012-09-18 NOTE — Progress Notes (Signed)
Psychoeducational Group Note  Date:  09/18/2012 Time:  16109  Group Topic/Focus:  Therapeutic activity  Participation Level:  Did Not Attend   Barbette Merino Theoren Palka Shari Prows 09/18/2012, 2:00 PM

## 2012-09-18 NOTE — ED Notes (Signed)
ACT is here and will re-eval

## 2012-09-18 NOTE — Progress Notes (Signed)
Pt disruptive to roommate as she continues with audible conversation with self. Roommate unable to sleep. At times can be heard yelling out but is not able to identify how staff might assist. Patient moved to new room and order received from MD for no roommate. Pt aware and cooperative. Lawrence Marseilles

## 2012-09-18 NOTE — ED Provider Notes (Signed)
Patient with bed at [facility] but is increasingly agitated.  Additional ativan ordered.   Hilario Quarry, MD 09/18/12 1003

## 2012-09-18 NOTE — Progress Notes (Signed)
Pt is a 66 year old female admitted with psychotic symptoms and symptoms of hypomania   She reports her nerves got bad   She had not been eating and taking her medications as prescribed   She lives with her niece and her children   Pt does not have teeth or dentures and has garbled speech   She is confused and difficult to get information from and refused to answer questions and seemed to be getting more agitated when asked more questions   Pt is clearly responding to internal stimuli and is somewhat confused  Pt has stopped eating drinking and bathing  She has stopped cooking and cleaning  She was uncooperative during the admission process and confused     Admission completed as much as possible  Pt was given nourishment and oriented to the unit    Put on 15 min checks and given a room  Pt is safe at present

## 2012-09-18 NOTE — ED Notes (Signed)
Pt still talking, appears to be talking to some on in the room, reasurred that she is safe here and that no one is going to harm her.  Reasurred, pt still is not able to answer direct questions and rambles.  Difficulty to understand  W/ rapid speech and no teeth present.

## 2012-09-18 NOTE — ED Notes (Signed)
Resting quietly
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
## 2012-09-18 NOTE — ED Notes (Signed)
Calmer, talking less, reasurred

## 2012-09-18 NOTE — ED Notes (Signed)
calmely in room eating popcycle

## 2012-09-18 NOTE — Progress Notes (Signed)
Pt accept to [facility] by Shaune Pollack NP to Dr. Jannifer Franklin 403-2.  Pt initially thought to be able to sign herself in, however pt reported she would not go to [facility].  Pt then changed her mind.  ACT went into to evaluate and pt was not Ox3, was speaking to persons in the room that were not physically there, never made eye contact with assessor and though cooperative, appeared delusional and unable to advocate for needs or understand what she would be signing.  ACT processed with MD and it was determined pt needed IVC to insure she would get the tx she appears to need.    IVC completed and signed off on by Dr. Rosalia Hammers in the [facility] and second opinion too.

## 2012-09-18 NOTE — ED Notes (Signed)
Crackers and soda give,  Waiting for dc, [facility] contacted and is aware that she will transport shortly

## 2012-09-18 NOTE — ED Notes (Signed)
laying on the bed

## 2012-09-18 NOTE — ED Notes (Signed)
Yolanda from [facility] is requesting we obtain urine drug screen and fix pt's 3.2 potassium before they take the pt.  MD notified.

## 2012-09-18 NOTE — Progress Notes (Signed)
Attempted to speak with pt 1:1 who is avoidant in eye contact and nonverbal communication. Speech is garbled and difficult to understand. Answers are short and provide minimal info. Pt requires prompting for simple tasks. Observed speaking to self almost continuously. Carries around brown bag of items. Pt medicated per orders, given support and encouragement. Pt has difficulty swallowing pills but did comply. Note on chart to MD to consider depakote sprinkles. No SI/HI reported or observed. Pt clearly responding to internal stimuli but remains safe.Lawrence Marseilles

## 2012-09-18 NOTE — ED Notes (Signed)
Pt ambulatory to [facility] w/ [employer] 

## 2012-09-18 NOTE — Tx Team (Signed)
Initial Interdisciplinary Treatment Plan  PATIENT STRENGTHS: (choose at least two) Supportive family/friends  PATIENT STRESSORS: Medication change or noncompliance   PROBLEM LIST: Problem List/Patient Goals Date to be addressed Date deferred Reason deferred Estimated date of resolution  Psychosis                                                       DISCHARGE CRITERIA:  Ability to meet basic life and health needs Adequate post-discharge living arrangements Improved stabilization in mood, thinking, and/or behavior  PRELIMINARY DISCHARGE PLAN: Attend aftercare/continuing care group  PATIENT/FAMIILY INVOLVEMENT: This treatment plan has been presented to and reviewed with the patient, Kaylee Kelley, and/or family member, .  The patient and family have been given the opportunity to ask questions and make suggestions.  Kaylee Kelley 09/18/2012, 1:56 PM

## 2012-09-18 NOTE — ED Notes (Signed)
Laying on the bed, talking, reasurred

## 2012-09-19 ENCOUNTER — Encounter (HOSPITAL_COMMUNITY): Payer: Self-pay | Admitting: Psychiatry

## 2012-09-19 DIAGNOSIS — F259 Schizoaffective disorder, unspecified: Secondary | ICD-10-CM
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
## 2012-09-19 NOTE — Clinical Social Work Note (Signed)
  Type of Therapy: Process Group Therapy  Participation Level:  Did Not Attend    Summary of Progress/Problems: Today's group addressed the issue of overcoming obstacles.  Patients were asked to identify their biggest obstacle post d/c that stands in the way of their on-going success, and then problem solve as to how to manage this.       Ida Rogue 09/19/2012   5:42 PM

## 2012-09-19 NOTE — Treatment Plan (Signed)
Interdisciplinary Treatment Plan Update (Adult)  Date: 09/19/2012  Time Reviewed: 5:44 PM   Progress in Treatment: Attending groups: Yes Participating in groups: Yes Taking medication as prescribed: Yes Tolerating medication: Yes   Family/Significant other contact made:  Yes Patient understands diagnosis:  No  Limited insight Discussing patient identified problems/goals with staff:  Yes  See below Medical problems stabilized or resolved:  Yes Denies suicidal/homicidal ideation: Yes In tx team Issues/concerns per patient self-inventory:  Yes  C/O poor sleep  "My mouth has sores in it." Other:  New problem(s) identified: N/A  Reason for Continuation of Hospitalization: Medication stabilization  Interventions implemented related to continuation of hospitalization:  Medications to address symptoms of mania and psychosis  Encourage group attendance and participation  Additional comments: C/M to contact family members for collaterol  Estimated length of stay: 2-4 days  Discharge Plan: return home, follow up outpt  New goal(s): N/A  Review of initial/current patient goals per problem list:   1.  Goal(s): Eliminate psychosis  Met:  No  Target date:12/5  As evidenced YQ:IHKV report/observation/collaterol contact with family  2.  Goal (s): Stabilize mood  Met:  No  Target date:12/5  As evidenced QQ:VZDG report/observation/collaterol contact with family   3.  Goal(s):D/C ASAP  Met:  No  Target date:12/4  As evidenced by:Dr order for d/c  4.  Goal(s):  Met:  No  Target date:  As evidenced by:  Attendees: Patient:     Family:     Physician:  Thedore Mins 09/19/2012 5:44 PM   Nursing:  Joslyn Devon  09/19/2012 5:44 PM   Clinical Social Worker:  Richelle Ito 09/19/2012 5:44 PM   Extender:  Verne Spurr PA 09/19/2012 5:44 PM   Other:     Other:     Other:     Other:      Scribe for Treatment Team:   Ida Rogue, 09/19/2012 5:44 PM

## 2012-09-19 NOTE — Progress Notes (Signed)
Patient ID: Kaylee Kelley, female   DOB: [birth year], 66 y.o.   MRN: [id number] Patient remains with disorganized thinking; quiet; isolative.  She denies any SI/HI/AVHI.  She has been compliant with her medications.  Patient has been attending groups.    Continue to monitor for medication management. Safety checks completed every 15 minutes per protocol.  Patient's behavior has been appropriate.

## 2012-09-19 NOTE — Clinical Social Work Note (Signed)
Kaylee Kelley did not attend AM group despite being invited.

## 2012-09-19 NOTE — Progress Notes (Signed)
D: Resting quietly in bed with eyes closed on approach. Opened eyes spontaneously to name. Appears flat and depressed. Brief with responses. Calm and cooperative with assessment. No acute distress. States she feels like she is doing better. Did endorse AH, but could not describe what they were saying. She denies SI/HI/VH and contracts for safety. Offers no questions or concerns.  A: Safety has been maintained with Q15 minute observation. Support and encouragement provided. Meds given as ordered by MD. POC explained and understanding verbalized.    R: Pt is calm and cooperative. She is attending groups and participating appropriately. Will continue Q15 minute observation and continue current POC

## 2012-09-19 NOTE — [facility] Suicide Risk Assessment (Signed)
Suicide Risk Assessment  Admission Assessment     Nursing information obtained from:  Patient Demographic factors:  Age 64 or older;Low socioeconomic status Current Mental Status:  NA Loss Factors:  NA Historical Factors:  NA Risk Reduction Factors:  Living with another person, especially a relative  CLINICAL FACTORS:   Schizophrenia:   Paranoid or undifferentiated type Currently Psychotic  COGNITIVE FEATURES THAT CONTRIBUTE TO RISK:  Closed-mindedness Polarized thinking    SUICIDE RISK:   Minimal: No identifiable suicidal ideation.  Patients presenting with no risk factors but with morbid ruminations; may be classified as minimal risk based on the severity of the depressive symptoms  PLAN OF CARE:1. Admit for crisis management and stabilization. 2. Medication management to reduce current symptoms to base line and improve the patient's overall level of functioning 3. Treat health problems as indicated. 4. Develop treatment plan to decrease risk of relapse upon discharge and the need for readmission. 5. Psycho-social education regarding relapse prevention and self care. 6. Health care follow up as needed for medical problems. 7. Restart home medications where appropriate.     Thedore Mins, MD 09/19/2012, 11:50 AM

## 2012-09-19 NOTE — Progress Notes (Signed)
Psychoeducational Group Note  Date:  09/19/2012 Time: 1100  Group Topic/Focus:  Self Care:   The focus of this group is to help patients understand the importance of self-care in order to improve or restore emotional, physical, spiritual, interpersonal, and financial health.  Participation Level:  Did Not Attend  Participation Quality:    Affect:    Cognitive:    Insight:    Engagement in Group:    Additional Comments:  Pt was in the room talking with the MD.  Gracy Racer 09/19/2012, 11:42 AM

## 2012-09-20 DIAGNOSIS — F200 Paranoid schizophrenia: Secondary | ICD-10-CM

## 2012-09-20 NOTE — Progress Notes (Addendum)
Pt in bed resting. Pt c/o knee pain. Pt received prn tylenol. Pt do not show any s/s of distress. Pt has rapid incoherent speech and is hard to understand. Pt denies SI/HI.  Pt endorse AVH, reporting she see shadows and hear voices. Pt yelling, screaming that her knees were hurting feeling like pins sticking her in her legs. A pillow was placed between the pt legs and the pt rested comfortably   Medications given as ordered per MD. Verbal support given. 15 minute checks performed for safety. Pt encouraged to attend groups.  Pt safe. In bed resting.

## 2012-09-20 NOTE — Progress Notes (Signed)
Psychoeducational Group Note  Date:  09/20/2012 Time:  2000  Group Topic/Focus:  Wrap-Up Group:   The focus of this group is to help patients review their daily goal of treatment and discuss progress on daily workbooks.  Participation Level:  Did Not Attend  Participation Quality:    Affect:    Cognitive:    Insight:    Engagement in Group:    Additional Comments:  Patient did not attend wrap-up group this evening as patient remained in room, awake, for the duration of group time tonight.  Joscelynn Brutus, Newton Pigg 09/20/2012, 8:36 PM

## 2012-09-20 NOTE — Progress Notes (Signed)
Psychoeducational Group Note  Date:  09/20/2012 Time: 0930  Group Topic/Focus:  Recovery Goals:   The focus of this group is to identify appropriate goals for recovery and establish a plan to achieve them.  Participation Level: Did Not Attend  Participation Quality:  Not Applicable  Affect:  Not Applicable  Cognitive:  Not Applicable  Insight:  Not Applicable  Engagement in Group: Not Applicable  Additional Comments:  Pt refused to attend group this morning  Emeric Novinger E 09/20/2012, 11:41 AM

## 2012-09-20 NOTE — Clinical Social Work Note (Signed)
Spoke with niece Gordy Councilman today who insisted for second day in a row that patient is at baseline and ready to d/c.  I explained that patient was unwilling to sign release to talk.  She was not surprised, and plans to come in tonite after work to have patient sign.  I alerted nurse.  I invited her to come in to tx team, but she stated she works and is unable to come in.

## 2012-09-20 NOTE — Progress Notes (Signed)
[facility] MD Progress Note  09/20/2012 10:49 AM Foye Spurling  MRN:  [id number]  Diagnosis:  Axis I: Chronic Paranoid Schizophrenia  ADL's:  Impaired  Sleep: Fair  Appetite:  Fair  Suicidal Ideation:  Plan:  denies Intent:  denies Means:  denies Homicidal Ideation:  Plan:  denies Intent:  denies Means:  denies  AEB (as evidenced by): Patient reports that she has pain all over her body. She continues to verbalize auditory and visual hallucinations. She remains paranoid, avoiding contact with staffs and peers. Patient is oppositional and has not been attending groups.  Mental Status Examination/Evaluation: Objective:  Appearance: Bizarre, Fairly Groomed and Guarded  Eye Contact::  Minimal  Speech:  Garbled  Volume:  Decreased  Mood:  Irritable  Affect:  Restricted  Thought Process:  Disorganized  Orientation:  Full  Thought Content:  Delusions, Hallucinations: Auditory Visual and Paranoid Ideation  Suicidal Thoughts:  No  Homicidal Thoughts:  No  Memory:  Immediate;   Fair Recent;   Fair Remote;   Fair  Judgement:  Impaired  Insight:  Shallow  Psychomotor Activity:  Increased  Concentration:  Fair  Recall:  Fair  Akathisia:  No  Handed:  Right  AIMS (if indicated):     Assets:  Social Support  Sleep:  Number of Hours: 5.25    Vital Signs:Blood pressure 114/83, pulse 69, temperature 98.4 F (36.9 C), temperature source Oral, resp. rate 17, height 5\' 2"  (1.575 m), weight 55.339 kg (122 lb). Current Medications: Current Facility-Administered Medications  Medication Dose Route Frequency Provider Last Rate Last Dose  . acetaminophen (TYLENOL) tablet 650 mg  650 mg Oral Q6H PRN Nanine Means, NP   650 mg at 09/20/12 1017  . alum & mag hydroxide-simeth (MAALOX/MYLANTA) 200-200-20 MG/5ML suspension 30 mL  30 mL Oral Q4H PRN Nanine Means, NP      . divalproex (DEPAKOTE ER) 24 hr tablet 1,500 mg  1,500 mg Oral QHS Nanine Means, NP   1,500 mg at 09/19/12 2132  .  hydrochlorothiazide (HYDRODIURIL) tablet 25 mg  25 mg Oral Daily Nanine Means, NP   25 mg at 09/20/12 0816  . irbesartan (AVAPRO) tablet 150 mg  150 mg Oral Daily Nanine Means, NP   150 mg at 09/20/12 0816  . lurasidone (LATUDA) tablet 80 mg  80 mg Oral BID Dejon Lukas   80 mg at 09/20/12 0815  . magnesium hydroxide (MILK OF MAGNESIA) suspension 30 mL  30 mL Oral Daily PRN Nanine Means, NP      . sertraline (ZOLOFT) tablet 50 mg  50 mg Oral Daily Nanine Means, NP   50 mg at 09/20/12 0815  . traZODone (DESYREL) tablet 100 mg  100 mg Oral QHS Nanine Means, NP   100 mg at 09/19/12 2132    Lab Results: No results found for this or any previous visit (from the past 48 hour(s)).  Physical Findings: AIMS: Facial and Oral Movements Muscles of Facial Expression: None, normal Lips and Perioral Area: None, normal Jaw: None, normal Tongue: None, normal,Extremity Movements Upper (arms, wrists, hands, fingers): None, normal Lower (legs, knees, ankles, toes): None, normal, Trunk Movements Neck, shoulders, hips: None, normal, Overall Severity Severity of abnormal movements (highest score from questions above): None, normal Incapacitation due to abnormal movements: None, normal Patient's awareness of abnormal movements (rate only patient's report): No Awareness, Dental Status Current problems with teeth and/or dentures?: No Does patient usually wear dentures?: No  CIWA:    COWS:     Treatment  Plan Summary: Daily contact with patient to assess and evaluate symptoms and progress in treatment Medication management  Plan: 1. I will continue patient on Latuda 80mg  daily with dinner for psychosis and delusions. 2. Will continue other treatment regimen. 3. Patient will be encouraged to attend groups and other treatment millieu.  Royal Beirne,MD 09/20/2012, 10:49 AM

## 2012-09-21 MED ORDER — DIVALPROEX SODIUM ER 500 MG PO TB24
1000.0000 mg | ORAL_TABLET | Freq: Every day | ORAL | Status: DC
  Filled 2012-09-21: qty 2

## 2012-09-21 MED ORDER — TRAZODONE HCL 100 MG PO TABS: 100.0000 mg | ORAL_TABLET | Freq: Every evening | ORAL | Status: DC | PRN

## 2012-09-21 MED ORDER — SERTRALINE HCL 100 MG PO TABS
100.0000 mg | ORAL_TABLET | Freq: Every day | ORAL | Status: DC
  Administered 2012-09-22 – 2012-09-23 (×2): 100 mg via ORAL
  Filled 2012-09-21 (×4): qty 1

## 2012-09-21 MED ORDER — NAPROXEN 500 MG PO TABS
500.0000 mg | ORAL_TABLET | Freq: Two times a day (BID) | ORAL | Status: DC
  Administered 2012-09-21 – 2012-09-23 (×5): 500 mg via ORAL
  Filled 2012-09-21 (×8): qty 1

## 2012-09-21 MED ORDER — VALPROIC ACID 250 MG/5ML PO SYRP
1000.0000 mg | ORAL_SOLUTION | Freq: Once | ORAL | Status: AC
  Administered 2012-09-21: 1000 mg via ORAL
  Filled 2012-09-21: qty 20

## 2012-09-21 NOTE — Clinical Social Work Note (Signed)
Left voicemail for pt's niece, Gordy Councilman, indicating discharge date (Fri, Nov. 6) and time (between 7-7:30pm when she gets off work) for pt. Phone number: [phone]

## 2012-09-21 NOTE — Progress Notes (Signed)
[facility] LCSW Aftercare Discharge Planning Group Note  09/21/2012 10:11 AM  Participation Quality:  Did not attend despite invitation    Kaylee Kelley 09/21/2012, 10:11 AM

## 2012-09-21 NOTE — Progress Notes (Signed)
Patient ID: Kaylee Kelley, female   DOB: 1946-06-04, 66 y.o.   MRN: [id number] [facility] MD Progress Note  09/21/2012 9:24 AM Foye Spurling  MRN:  [id number]  Diagnosis:  Axis I: Chronic Paranoid Schizophrenia  ADL's:  Impaired  Sleep: Fair  Appetite:  Fair  Suicidal Ideation:  Plan:  denies Intent:  denies Means:  denies Homicidal Ideation:  Plan:  denies Intent:  denies Means:  denies  AEB (as evidenced by): Patient reports that she has knee pain, paranoid, seeing shadows and hearing voices telling her to do things that she is not willing to do. She remains socially isolated and stay in her room, avoiding contacts with peers and staffs. Patient is compliant with her medications and did not report any adverse reaction to it.  Mental Status Examination/Evaluation: Objective:  Appearance: Bizarre, Fairly Groomed and Guarded  Eye Contact::  Minimal  Speech:  Garbled  Volume:  Decreased  Mood:  Irritable  Affect:  Restricted  Thought Process:  Disorganized  Orientation:  Full  Thought Content:  Delusions, Hallucinations: Auditory Visual and Paranoid Ideation  Suicidal Thoughts:  No  Homicidal Thoughts:  No  Memory:  Immediate;   Fair Recent;   Fair Remote;   Fair  Judgement:  Impaired  Insight:  Shallow  Psychomotor Activity:  Increased  Concentration:  Fair  Recall:  Fair  Akathisia:  No  Handed:  Right  AIMS (if indicated):     Assets:  Social Support  Sleep:  Number of Hours: 3.5    Vital Signs:Blood pressure 108/65, pulse 78, temperature 98.1 F (36.7 C), temperature source Oral, resp. rate 18, height 5\' 2"  (1.575 m), weight 55.339 kg (122 lb). Current Medications: Current Facility-Administered Medications  Medication Dose Route Frequency Provider Last Rate Last Dose  . alum & mag hydroxide-simeth (MAALOX/MYLANTA) 200-200-20 MG/5ML suspension 30 mL  30 mL Oral Q4H PRN Nanine Means, NP      . divalproex (DEPAKOTE ER) 24 hr tablet 1,000 mg  1,000 mg Oral QHS Malyssa Maris      . hydrochlorothiazide (HYDRODIURIL) tablet 25 mg  25 mg Oral Daily Nanine Means, NP   25 mg at 09/21/12 0827  . irbesartan (AVAPRO) tablet 150 mg  150 mg Oral Daily Nanine Means, NP   150 mg at 09/21/12 0827  . lurasidone (LATUDA) tablet 80 mg  80 mg Oral BID Savayah Waltrip   80 mg at 09/21/12 0827  . magnesium hydroxide (MILK OF MAGNESIA) suspension 30 mL  30 mL Oral Daily PRN Nanine Means, NP      . naproxen (NAPROSYN) tablet 500 mg  500 mg Oral BID WC Tonyia Marschall      . sertraline (ZOLOFT) tablet 100 mg  100 mg Oral Daily Leane Loring      . traZODone (DESYREL) tablet 100 mg  100 mg Oral QHS Nanine Means, NP   100 mg at 09/20/12 2157  . [DISCONTINUED] acetaminophen (TYLENOL) tablet 650 mg  650 mg Oral Q6H PRN Nanine Means, NP   650 mg at 09/21/12 0845  . [DISCONTINUED] divalproex (DEPAKOTE ER) 24 hr tablet 1,500 mg  1,500 mg Oral QHS Nanine Means, NP   500 mg at 09/20/12 2157  . [DISCONTINUED] sertraline (ZOLOFT) tablet 50 mg  50 mg Oral Daily Nanine Means, NP   50 mg at 09/21/12 6295    Lab Results: No results found for this or any previous visit (from the past 48 hour(s)).  Physical Findings: AIMS: Facial and Oral Movements Muscles of  Facial Expression: None, normal Lips and Perioral Area: None, normal Jaw: None, normal Tongue: None, normal,Extremity Movements Upper (arms, wrists, hands, fingers): None, normal Lower (legs, knees, ankles, toes): None, normal, Trunk Movements Neck, shoulders, hips: None, normal, Overall Severity Severity of abnormal movements (highest score from questions above): None, normal Incapacitation due to abnormal movements: None, normal Patient's awareness of abnormal movements (rate only patient's report): No Awareness, Dental Status Current problems with teeth and/or dentures?: No Does patient usually wear dentures?: No  CIWA:    COWS:     Treatment Plan Summary: Daily contact with patient to assess and evaluate symptoms and  progress in treatment Medication management  Plan: 1. I will continue patient on Latuda 80mg  twice daily  for psychosis and delusions. 2. Will continue other treatment regimen. 3. Naproxen 500mg  po twice daily for knee pain 4. Patient will be encouraged to attend groups and other treatment millieu.  Burnette Sautter,MD 09/21/2012, 9:24 AM

## 2012-09-21 NOTE — Progress Notes (Signed)
Pt in bed resting. Patient denies pain at this time but continues to c/o knee pain. Pt do not appear to be  In distress. Pt compliant with morning meds. Pt denies SI/HI. Pt endorse AH, report voices telling her to do things but she don't listen to the voices. Pt denies  Visual hallucinations. Pt refused to go to groups.  Medications administered as ordered per MD. Verbal support given. Pt encouraged to attend groups. 15 minute checks performed for safety. Shift assessment performed.  Pt safe. In bed resting. Depressed affect.

## 2012-09-21 NOTE — Progress Notes (Signed)
Patient ID: Kaylee Kelley, female   DOB: Nov 25, 1945, 66 y.o.   MRN: [id number]  D: Pt continues to have AVH, with shadows and voices just talking. Pt denies SI/HI. Pt is combative and argumenative. Pt complains that medications are causing her problems saying" it makes her burn and is deforming her. Pt refused 1000mg  of depakote. Pt states Depakote too large for her to swallow.   A: Pt was offered support and encouragement. Pt was given scheduled medications. Pt was encourage to attend groups. Q 15 minute checks were done for safety. Pt was encouraged to sign consent/ release of information papers. Depakote pill split in half.  R:Pt did not attend groupf. Pt is took 500 mg of 1500 mg of Depakote scheduled . Pt has no complaints.Pt receptive to treatment and safety maintained on unit. Pt signed consent papers

## 2012-09-21 NOTE — Progress Notes (Signed)
Patient ID: Kaylee Kelley, female   DOB: 1945/12/18, 66 y.o.   MRN: [id number]  PER STATE REGULATIONS 482.30  THIS CHART WAS REVIEWED FOR MEDICAL NECESSITY WITH RESPECT TO THE PATIENT'S ADMISSION/ DURATION OF STAY.  NEXT REVIEW DATE: 09/24/2012  Willa Rough, RN, BSN CASE MANAGER

## 2012-09-21 NOTE — Clinical Social Work Maternal (Signed)
[facility] Group Notes:  (Counselor/Nursing/MHT/Case Management/Adjunct)  09/02/2012 12:00 PM  Type of Therapy:  Group Therapy  Participation Level:  Minimal  Participation Quality:  Drowsy  Affect:  Appropriate  Cognitive:  Disorganized and Confused  Insight:  Limited  Engagement in Group:  Poor  Engagement in Therapy:  Poor  Modes of Intervention:  Education, Socialization and Support  Summary of Progress/Problems: MHA: Patient came to group late and complained of being cold. Pt appeared disorganized and confused during her time in group. She got up and left after about ten minutes.    Smart, Heather N 09/02/2012, 12:00 PM

## 2012-09-21 NOTE — Progress Notes (Addendum)
Patient ID: Kaylee Kelley, female   DOB: 04-Jun-1946, 66 y.o.   MRN: [id number] D: Patient in room sleeping on approach. Pt presented with depressed mood and flat affect. Pt continues to have auditory and visual hallucination but denies pain. Pt was spitting up in her room and talking to the trash saying "leave me alone".No acute distressed noted at this time.  Pt did not attended evening wrap up group. Cooperative with assessment.  Pt encouraged to come to staff with any question or concerns   A: Met with pt 1:1. Medications administered as prescribed. Safety has been maintained with Q15 minutes observation. Supported and encouragement provided.  R: Patient remains safe. She is complaint with medications. Safety has been maintained Q15 and continue current POC

## 2012-09-21 NOTE — Progress Notes (Signed)
Psychoeducational Group Note  Date:  09/21/2012 Time:  2000  Group Topic/Focus:  Wrap-Up Group:   The focus of this group is to help patients review their daily goal of treatment and discuss progress on daily workbooks.  Participation Level: Did Not Attend  Participation Quality:  Not Applicable  Affect:  Not Applicable  Cognitive:  Not Applicable  Insight:  Not Applicable  Engagement in Group: Not Applicable  Additional Comments:    Flonnie Hailstone 09/21/2012, 8:41 PM

## 2012-09-21 NOTE — Clinical Social Work Note (Signed)
  Aftercare Planning Group on 09/20/2012   Pt did not attend discharge planning group  Carney Bern, [place] Clinical Social Worker [phone]

## 2012-09-22 NOTE — Progress Notes (Signed)
Patient ID: Kaylee Kelley, female   DOB: November 09, 1945, 66 y.o.   MRN: [id number] Patient remains isolative to her room; flat and blunted affect.  She reports that she is hearing voices; denies any SI/HI.  She is agreeable to discharge tomorrow; she states that her niece will have to be notified that she will be coming home.  Patient continues to have minimal interaction with staff; compliant with her medications at this time.    Continue to monitor medication management and MD orders.  Collaborate with treatment team members regarding patient's POC.  Safety checks continued every 15 minutes per protocol.  Patient's behavior has been appropriate; encourage and support as needed.

## 2012-09-22 NOTE — Progress Notes (Signed)
Adult Psychosocial Assessment Update Interdisciplinary Team  Previous [facility] admissions/discharges:  Admissions Discharges  Date:current Date:  Date:8/13 Date:  Date: Date:  Date: Date:  Date: Date:   Changes since the last Psychosocial Assessment (including adherence to outpatient mental health and/or substance abuse treatment, situational issues contributing to decompensation and/or relapse). Kaylee Kelley missed her last appointment at mental health in October.  She has struggled since moving here with her niece a year ago as she is used to having her own space with little distraction/interruption.  Her niece has 5 children, four of whom live in the home.  In the last 2 weeks she quit taking her medication and was doing limited ADLs.             Discharge Plan 1. Will you be returning to the same living situation after discharge?   ZOX:[id number] No:      If no, what is your plan?           2. Would you like a referral for services when you are discharged? Yes:X     If yes, for what services?  No:       Quarry manager and Recommendations (to be completed by the evaluator) Resume medications, adujst accordingly, encourage group attendance and participation, collateral contact with niece, set up for aftercare services                       Signature:  Kaylee Kelley, 09/22/2012 2:13 PM

## 2012-09-22 NOTE — Progress Notes (Signed)
Patient ID: Kaylee Kelley, female   DOB: 1945-12-22, 66 y.o.   MRN: [id number] Pt. Smiling and engages in conversation as  Clinical research associate invited her to have a snack. Pt. Reports "I didn't eat no dinner". Writer encouraged pt. To verbalized feeling so staff will know how to assist her. Pt. Ate soft snack with no difficulty.

## 2012-09-22 NOTE — Progress Notes (Signed)
Patient ID: Kaylee Kelley, female   DOB: 1946-02-01, 66 y.o.   MRN: [id number] D: pt. Lying in bed, nods yes or no when writer initiates conversation. A: Writer introduced self to client and encouraged conversation. Staff will monitor q42min for safety. Staff encouraged group. R: Pt. Is safe on the unit, refuses group.

## 2012-09-22 NOTE — Progress Notes (Signed)
Patient did not attend the evening karaoke group.  

## 2012-09-22 NOTE — Progress Notes (Signed)
[facility] LCSW Group Therapy  09/22/2012 1:48 PM  Type of Therapy:  Group Therapy  Participation Level:  Did Not Attend    Ida Rogue 09/22/2012, 1:48 PM

## 2012-09-22 NOTE — Progress Notes (Signed)
[facility] LCSW Aftercare Discharge Planning Group Note  09/22/2012 9:27 AM  Participation Quality:  DID NOT ATTEND GROUP :  Smart, Ledell Peoples 09/22/2012, 9:27 AM

## 2012-09-22 NOTE — Progress Notes (Signed)
[facility] Group Notes:  (Counselor/Nursing/MHT/Case Management/Adjunct)  09/22/2012 12:16 PM  Type of Therapy:  Psychoeducational Skills  Participation Level:  Did Not Attend  Summary of Progress/Problems:  Kaylee Kelley did not attend sharing activity with group.   Wandra Scot 09/22/2012, 12:16 PM

## 2012-09-22 NOTE — Progress Notes (Signed)
[facility] MD Progress Note  09/22/2012 10:29 AM Kaylee Kelley  MRN:  [id number] Subjective: " I am still hearing voices" Diagnosis:  Axis I: Chronic Paranoid Schizophrenia  ADL's:  Impaired  Sleep: Fair  Appetite:  Fair  Suicidal Ideation:  Plan:  denies Intent:  denies Means:  denies Homicidal Ideation:  Plan:  denies Intent:  denies Means:  denies  AEB (as evidenced by): Patient reports ongoing auditory hallucinations but denies visual hallucinations. Patient is less delusional and has started to participate in group therapy. Patient is compliant with her medications and has not reported any adverse reactions.  Psychiatric Specialty Exam: Review of Systems  Constitutional: Negative for malaise/fatigue.  HENT: Negative.   Eyes: Negative.   Respiratory: Negative.   Cardiovascular: Negative.   Gastrointestinal: Negative.   Genitourinary: Negative.   Musculoskeletal: Negative for joint pain.  Skin: Negative.   Neurological: Negative.   Endo/Heme/Allergies: Negative.   Psychiatric/Behavioral: Negative for hallucinations.    Blood pressure 104/59, pulse 66, temperature 98.1 F (36.7 C), temperature source Oral, resp. rate 16, height 5\' 2"  (1.575 m), weight 55.339 kg (122 lb).Body mass index is 22.31 kg/(m^2).  General Appearance: Casual and Fairly Groomed  Patent attorney::  Minimal  Speech:  Garbled  Volume:  Normal  Mood:  Euthymic  Affect:  Restricted  Thought Process:  Loose  Orientation:  Full (Time, Place, and Person)  Thought Content:  Hallucinations: Auditory  Suicidal Thoughts:  No  Homicidal Thoughts:  No  Memory:  Immediate;   Fair Recent;   Fair Remote;   Fair  Judgement:  Fair  Insight:  Shallow  Psychomotor Activity:  Psychomotor Retardation  Concentration:  Fair  Recall:  Fair  Akathisia:  No  Handed:  Right  AIMS (if indicated):     Assets:  Desire for Improvement Social Support  Sleep:  Number of Hours: 1.25    Current Medications: Current  Facility-Administered Medications  Medication Dose Route Frequency Provider Last Rate Last Dose  . alum & mag hydroxide-simeth (MAALOX/MYLANTA) 200-200-20 MG/5ML suspension 30 mL  30 mL Oral Q4H PRN Nanine Means, NP      . hydrochlorothiazide (HYDRODIURIL) tablet 25 mg  25 mg Oral Daily Nanine Means, NP   25 mg at 09/21/12 0827  . irbesartan (AVAPRO) tablet 150 mg  150 mg Oral Daily Nanine Means, NP   150 mg at 09/22/12 0818  . lurasidone (LATUDA) tablet 80 mg  80 mg Oral BID Bijou Easler   80 mg at 09/22/12 0818  . magnesium hydroxide (MILK OF MAGNESIA) suspension 30 mL  30 mL Oral Daily PRN Nanine Means, NP      . naproxen (NAPROSYN) tablet 500 mg  500 mg Oral BID WC Vikrant Pryce   500 mg at 09/22/12 0818  . sertraline (ZOLOFT) tablet 100 mg  100 mg Oral Daily Jaide Hillenburg   100 mg at 09/22/12 0818  . traZODone (DESYREL) tablet 100 mg  100 mg Oral QHS PRN Rochella Benner      . [COMPLETED] Valproic Acid (DEPAKENE) 250 MG/5ML syrup SYRP 1,000 mg  1,000 mg Oral Once Graciella Arment   1,000 mg at 09/21/12 2125  . [DISCONTINUED] divalproex (DEPAKOTE ER) 24 hr tablet 1,000 mg  1,000 mg Oral QHS Bobette Leyh      . [DISCONTINUED] traZODone (DESYREL) tablet 100 mg  100 mg Oral QHS Nanine Means, NP   100 mg at 09/20/12 2157    Lab Results: No results found for this or any previous visit (from  the past 48 hour(s)).  Physical Findings: AIMS: Facial and Oral Movements Muscles of Facial Expression: None, normal Lips and Perioral Area: None, normal Jaw: None, normal Tongue: None, normal,Extremity Movements Upper (arms, wrists, hands, fingers): None, normal Lower (legs, knees, ankles, toes): None, normal, Trunk Movements Neck, shoulders, hips: None, normal, Overall Severity Severity of abnormal movements (highest score from questions above): None, normal Incapacitation due to abnormal movements: None, normal Patient's awareness of abnormal movements (rate only patient's report): No  Awareness, Dental Status Current problems with teeth and/or dentures?: No Does patient usually wear dentures?: No  CIWA:    COWS:     Treatment Plan Summary: Daily contact with patient to assess and evaluate symptoms and progress in treatment Medication management  Plan:  Medical Decision Making Problem Points:  Established problem, stable/improving (1) Data Points:  Review or order clinical lab tests (1) Review of medication regiment & side effects (2)  I certify that inpatient services furnished can reasonably be expected to improve the patient's condition.   Gerrett Loman,MD 09/22/2012, 10:29 AM

## 2012-09-23 MED ORDER — SERTRALINE HCL 100 MG PO TABS: 100.0000 mg | ORAL_TABLET | Freq: Every day | ORAL | Status: DC

## 2012-09-23 MED ORDER — LURASIDONE HCL 80 MG PO TABS: 80.0000 mg | ORAL_TABLET | ORAL | Status: DC

## 2012-09-23 MED ORDER — TRAZODONE HCL 100 MG PO TABS: 100.0000 mg | ORAL_TABLET | Freq: Every day | ORAL | Status: DC

## 2012-09-23 NOTE — Progress Notes (Signed)
[facility] Adult Case Management Discharge Plan :  Will you be returning to the same living situation after discharge: Yes,  home with neice At discharge, do you have transportation home?:Yes,  neice Do you have the ability to pay for your medications:Yes,  mental health  Release of information consent forms completed and in the chart;  Patient's signature needed at discharge.  Patient to Follow up at: Follow-up Information    Follow up with [facility]. On 09/30/2012. (8:30 with Emi Belfast at 8:15)    Contact information:   [address] [place]  [phone]      Follow up with [facility]. On 09/30/2012. (1:30 for assessment at their office  After this initial appointment, they will come out to the home)    Contact information:   [address]  [place]  [phone]         Patient denies SI/HI:   Yes,  Yes    Safety Planning and Suicide Prevention discussed:  Yes,  Yes  Ida Rogue 09/23/2012, 8:43 AM

## 2012-09-23 NOTE — Progress Notes (Signed)
[facility] Group Notes:  (Counselor/Nursing/MHT/Case Management/Adjunct)  09/23/2012 8:15 PM  Type of Therapy:  Group Therapy  Participation Level:  Did Not Attend   Clide Dales 09/23/2012, 8:15 PM

## 2012-09-23 NOTE — Progress Notes (Signed)
Patient ID: Kaylee Kelley, female   DOB: 04-08-1946, 66 y.o.   MRN: [id number] The patient is being discharged home with her niece. Denies suicidal ideation. Denies any auditory/visual hallucinations at present. The patient has no belongings in her locker. No prescriptions are written for her to take home. Reviewed medication and the necessity to take medications as prescribed and to follow up with [facility]. AVS printed and signed. A copy was given to the patient. Discharged home with niece.

## 2012-09-23 NOTE — [facility] Suicide Risk Assessment (Signed)
Suicide Risk Assessment  Discharge Assessment     Demographic Factors:  female  Mental Status Per Nursing Assessment::   On Admission:  NA  Current Mental Status by Physician: Patient denies suicidal ideations, intent or plan  Loss Factors: Financial problems/change in socioeconomic status  Historical Factors: unknown  Risk Reduction Factors:   Living with another person, especially a relative, Positive social support and Positive therapeutic relationship  Continued Clinical Symptoms:  Schizophrenia:   Paranoid or undifferentiated type  Cognitive Features That Contribute To Risk:  Closed-mindedness Polarized thinking Thought constriction (tunnel vision)    Suicide Risk:  Minimal: No identifiable suicidal ideation.  Patients presenting with no risk factors but with morbid ruminations; may be classified as minimal risk based on the severity of the depressive symptoms  Discharge Diagnoses:   AXIS I:  Chronic Paranoid Schizophrenia AXIS II:  Deferred AXIS III:   Past Medical History  Diagnosis Date  . Hypertension   . Pre-diabetes    AXIS IV:  other psychosocial or environmental problems and problems related to social environment AXIS V:  51-60 moderate symptoms  Plan Of Care/Follow-up recommendations:  Activity:  as tolerated Diet:  healthy Tests:  routine Other:  patient to keep her after care appointment and takes her medications as recommended.  Is patient on multiple antipsychotic therapies at discharge:  No   Has Patient had three or more failed trials of antipsychotic monotherapy by history:  No  Recommended Plan for Multiple Antipsychotic Therapies: N/A  Thedore Mins, MD 09/23/2012, 10:43 AM

## 2012-09-23 NOTE — Progress Notes (Signed)
[facility] Group Notes:  (Counselor/Nursing/MHT/Case Management/Adjunct)  09/23/2012 8:15 PM  Type of Therapy:  Discharge Planning  Participation Level:  Did Not Attend   Clide Dales 09/23/2012, 8:15 PM

## 2012-09-23 NOTE — Progress Notes (Signed)
See d/c note

## 2012-09-23 NOTE — Progress Notes (Signed)
Psychoeducational Group Note  Date:  09/23/2012 Time:  2000  Group Topic/Focus:  Wrap-Up Group:   The focus of this group is to help patients review their daily goal of treatment and discuss progress on daily workbooks.  Participation Level: Did Not Attend  Participation Quality:  Not Applicable  Affect:  Not Applicable  Cognitive:  Not Applicable  Insight:  Not Applicable  Engagement in Group: Not Applicable  Additional Comments:  The patient slept during group.   Hazle Coca S 09/23/2012, 11:38 PM

## 2012-09-27 NOTE — Progress Notes (Deleted)
Patient Discharge Instructions:  After Visit Summary (AVS):   Faxed to:  09/27/12 Psychiatric Admission Assessment Note:   Faxed to:  09/27/12 Suicide Risk Assessment - Discharge Assessment:   Faxed to:  09/27/12 Faxed/Sent to the Next Level Care provider:  09/27/12 Faxed to [facility] @ [phone] Faxed to [facility] @ [phone]  Jerelene Redden, 09/27/2012, 2:52 PM

## 2012-09-27 NOTE — Progress Notes (Signed)
Patient Discharge Instructions:  After Visit Summary (AVS): Faxed to: 09/27/12  Psychiatric Admission Assessment Note: Faxed to: 09/27/12  Suicide Risk Assessment - Discharge Assessment: Faxed to: 09/27/12  Faxed/Sent to the Next Level Care provider: 09/27/12  Faxed to [facility] @ [phone]  Faxed to [facility] @ [phone]  Jerelene Redden, 09/27/2012, 4:29 PM

## 2012-10-02 NOTE — Discharge Summary (Signed)
Physician Discharge Summary Note  Patient:  Kaylee Kelley is an 66 y.o., female MRN:  [id number] DOB:  1946/01/15 Patient phone:  [phone] (home)  Patient address:   [address] [place] [address],   Date of Admission:  09/18/2012 Date of Discharge: 09/23/2012  Reason for Admission: psychosis Discharge Diagnoses: Principal Problem:  *Schizophrenia, paranoid type Discharge Diagnoses:  AXIS I: Chronic Paranoid Schizophrenia  AXIS II: Deferred  AXIS III:  Past Medical History   Diagnosis  Date   .  Hypertension    .  Pre-diabetes    AXIS IV: other psychosocial or environmental problems and problems related to social environment  AXIS V: 51-60 moderate symptoms   Review of Systems  Constitutional: Negative.  Negative for fever, chills, weight loss, malaise/fatigue and diaphoresis.  HENT: Negative for congestion and sore throat.   Eyes: Negative for blurred vision, double vision and photophobia.  Respiratory: Negative for cough, shortness of breath and wheezing.   Cardiovascular: Negative for chest pain, palpitations and PND.  Gastrointestinal: Negative for heartburn, nausea, vomiting, abdominal pain, diarrhea and constipation.  Musculoskeletal: Negative for myalgias, joint pain and falls.  Neurological: Negative for dizziness, tingling, tremors, sensory change, speech change, focal weakness, seizures, loss of consciousness, weakness and headaches.  Endo/Heme/Allergies: Negative for polydipsia. Does not bruise/bleed easily.  Psychiatric/Behavioral: Negative for depression, suicidal ideas, hallucinations, memory loss and substance abuse. The patient is not nervous/anxious and does not have insomnia.    Level of Care:  OP  Hospital Course:  Kaylee Kelley was admitted for crisis management and stabilization when she presented with auditory and visual hallucinations, angry outbursts and mood swings, delusions  with poor self care. She has a long history of mental illness.       She was evaluated by the treatment team including MD, PA, RN, CM and LCSW. Her home medications were restarted and her symptoms of psychosis were treated with Latuda 80mg  tabs. Her depressive symptoms were treated with zoloft titrated to 100mg .  She responded well to treatment and supportive care.     Kaylee Kelley was evaluated daily by clinical providers, and her response to treatment was noted in her verbal response to questions, her affect and her return to baseline behavioral functioning provided by her family.  On the day of discharge she was in much improved condition and felt stable for discharge home.  Consults:  None  Significant Diagnostic Studies:  None  Discharge Vitals:   Blood pressure 134/82, pulse 77, temperature 98 F (36.7 C), temperature source Oral, resp. rate 20, height 5\' 2"  (1.575 m), weight 55.339 kg (122 lb). Body mass index is 22.31 kg/(m^2). Lab Results:   No results found for this or any previous visit (from the past 72 hour(s)).  Physical Findings: AIMS: Facial and Oral Movements Muscles of Facial Expression: None, normal Lips and Perioral Area: None, normal Jaw: None, normal Tongue: None, normal,Extremity Movements Upper (arms, wrists, hands, fingers): None, normal Lower (legs, knees, ankles, toes): None, normal, Trunk Movements Neck, shoulders, hips: None, normal, Overall Severity Severity of abnormal movements (highest score from questions above): None, normal Incapacitation due to abnormal movements: None, normal Patient's awareness of abnormal movements (rate only patient's report): No Awareness, Dental Status Current problems with teeth and/or dentures?: No Does patient usually wear dentures?: No  CIWA:    COWS:     Psychiatric Specialty Exam: See Psychiatric Specialty Exam and Suicide Risk Assessment completed by Attending Physician prior to discharge.  Discharge destination:  Home  Is patient  on multiple antipsychotic therapies at discharge:  No    Has Patient had three or more failed trials of antipsychotic monotherapy by history:  No  Recommended Plan for Multiple Antipsychotic Therapies: NA  Discharge Orders    Future Orders Please Complete By Expires   Diet - low sodium heart healthy      Increase activity slowly      Discharge instructions      Comments:   Take all of your medications as prescribed.  Be sure to keep ALL follow up appointments as scheduled. This is to ensure getting your refills on time to avoid any interruption in your medication.  If you find that you can not keep your appointment, call and reschedule. Be sure to tell the nurse if you will need a refill before your appointment.       Medication List     As of 10/02/2012  4:16 PM    STOP taking these medications         acetaminophen 500 MG tablet   Commonly known as: TYLENOL      divalproex 500 MG 24 hr tablet   Commonly known as: DEPAKOTE ER      NOREL CS PO      TAKE these medications      Indication    fluticasone 50 MCG/ACT nasal spray   Commonly known as: FLONASE   Place 2 sprays into the nose daily. For seasonal allergies.       hydrochlorothiazide 25 MG tablet   Commonly known as: HYDRODIURIL   Take 1 tablet (25 mg total) by mouth daily. For blood pressure control.       irbesartan 150 MG tablet   Commonly known as: AVAPRO   Take 1 tablet (150 mg total) by mouth daily. For blood pressure control.       lurasidone 80 MG Tabs   Commonly known as: LATUDA   Take 1 tablet (80 mg total) by mouth 2 (two) times daily in the am and at bedtime.. For psychosis.    Indication: Depressive Phase of Manic-Depression      potassium chloride SA 20 MEQ tablet   Commonly known as: K-DUR,KLOR-CON   Take 1 tablet (20 mEq total) by mouth daily with breakfast. For hypokalemia.       sertraline 100 MG tablet   Commonly known as: ZOLOFT   Take 1 tablet (100 mg total) by mouth daily. For anxiety and depression.    Indication: Major Depressive  Disorder      traZODone 100 MG tablet   Commonly known as: DESYREL   Take 1 tablet (100 mg total) by mouth at bedtime.    Indication: Trouble Sleeping           Follow-up Information    Follow up with [facility]. On 09/30/2012. (8:30 with Emi Belfast at 8:15)    Contact information:   [address] [place]  [phone]      Follow up with [facility]. On 09/30/2012. (1:30 for assessment at their office  After this initial appointment, they will come out to the home)    Contact information:   [address]  [place]  [phone]         Follow-up recommendations:  As noted above  Comments:   Total Discharge Time:  >30 minutes  Signed: Lloyd Huger T. Renesme Kerrigan PAC 10/02/2012, 4:16 PM

## 2012-10-03 NOTE — Discharge Summary (Signed)
Seen and agreed. Yanni Ruberg, MD 

## 2012-10-24 DIAGNOSIS — F209 Schizophrenia, unspecified: Secondary | ICD-10-CM | POA: Diagnosis not present

## 2013-01-24 ENCOUNTER — Emergency Department (INDEPENDENT_AMBULATORY_CARE_PROVIDER_SITE_OTHER): Payer: Medicaid Other

## 2013-01-24 ENCOUNTER — Encounter (HOSPITAL_COMMUNITY): Payer: Self-pay | Admitting: *Deleted

## 2013-01-24 ENCOUNTER — Emergency Department (INDEPENDENT_AMBULATORY_CARE_PROVIDER_SITE_OTHER)
Admission: EM | Admit: 2013-01-24 | Discharge: 2013-01-24 | Disposition: A | Discharge status: Home / Self Care | Payer: Medicare Other | Source: Home / Self Care | Attending: Family Medicine | Admitting: Family Medicine

## 2013-01-24 DIAGNOSIS — S43109A Unspecified dislocation of unspecified acromioclavicular joint, initial encounter: Secondary | ICD-10-CM

## 2013-01-24 DIAGNOSIS — M25519 Pain in unspecified shoulder: Secondary | ICD-10-CM | POA: Diagnosis not present

## 2013-01-24 DIAGNOSIS — S4980XA Other specified injuries of shoulder and upper arm, unspecified arm, initial encounter: Secondary | ICD-10-CM | POA: Diagnosis not present

## 2013-01-24 DIAGNOSIS — S43101A Unspecified dislocation of right acromioclavicular joint, initial encounter: Secondary | ICD-10-CM

## 2013-01-24 MED ORDER — HYDROCODONE-ACETAMINOPHEN 5-325 MG PO TABS: 1.0000 | ORAL_TABLET | ORAL | Status: DC | PRN

## 2013-01-24 NOTE — ED Notes (Addendum)
Per caregiver pt fell while going up the stairs last night because she forgot to turn on the light. Injured right shoulder - tender to touch - caregiver is able to answer questions - pt is poor historian

## 2013-01-24 NOTE — ED Provider Notes (Signed)
History     CSN: [id number]  Arrival date & time 01/24/13  1240   First MD Initiated Contact with Patient 01/24/13 1429      Chief Complaint  Patient presents with  . Fall    (Consider location/radiation/quality/duration/timing/severity/associated sxs/prior treatment) Patient is a 67 y.o. female presenting with fall. The history is provided by the patient and a relative.  Fall The accident occurred yesterday. She landed on a hard floor. The point of impact was the right shoulder. The pain is present in the right shoulder. The pain is mild.    Past Medical History  Diagnosis Date  . Hypertension   . Pre-diabetes   . Bipolar affective disorder   . Schizophrenic disorder     History reviewed. No pertinent past surgical history.  Family History  Problem Relation Age of Onset  . Hypertension Mother   . Colon cancer Mother   . Diabetes Mother   . Hypertension Father     History  Substance Use Topics  . Smoking status: Never Smoker   . Smokeless tobacco: Not on file  . Alcohol Use: No     Comment: Pt denies     OB History   Grav Para Term Preterm Abortions TAB SAB Ect Mult Living                  Review of Systems  Constitutional: Negative.   Musculoskeletal: Positive for joint swelling. Negative for myalgias and back pain.  Skin: Negative.     Allergies  Review of patient's allergies indicates no known allergies.  Home Medications   Current Outpatient Rx  Name  Route  Sig  Dispense  Refill  . fluticasone (FLONASE) 50 MCG/ACT nasal spray   Nasal   Place 2 sprays into the nose daily. For seasonal allergies.   16 g   0   . hydrochlorothiazide (HYDRODIURIL) 25 MG tablet   Oral   Take 1 tablet (25 mg total) by mouth daily. For blood pressure control.   30 tablet   0   . HYDROcodone-acetaminophen (NORCO/VICODIN) 5-325 MG per tablet   Oral   Take 1 tablet by mouth every 4 (four) hours as needed for pain.   15 tablet   0   . irbesartan (AVAPRO) 150  MG tablet   Oral   Take 1 tablet (150 mg total) by mouth daily. For blood pressure control.   30 tablet   0   . lurasidone (LATUDA) 80 MG TABS   Oral   Take 1 tablet (80 mg total) by mouth 2 (two) times daily in the am and at bedtime.. For psychosis.   60 tablet   0   . potassium chloride SA (K-DUR,KLOR-CON) 20 MEQ tablet   Oral   Take 1 tablet (20 mEq total) by mouth daily with breakfast. For hypokalemia.   30 tablet   0   . sertraline (ZOLOFT) 100 MG tablet   Oral   Take 1 tablet (100 mg total) by mouth daily. For anxiety and depression.   30 tablet   0   . traZODone (DESYREL) 100 MG tablet   Oral   Take 1 tablet (100 mg total) by mouth at bedtime.   30 tablet   1     BP 123/64  Pulse 67  Temp(Src) 98.3 F (36.8 C) (Oral)  Resp 20  SpO2 98%  Physical Exam  Nursing note and vitals reviewed. Constitutional: She appears well-developed and well-nourished.  Musculoskeletal: She exhibits tenderness.  Right shoulder: She exhibits decreased range of motion, tenderness, bony tenderness, swelling, crepitus, deformity and pain. She exhibits normal pulse.       Arms: Neurological: She is alert.  Skin: Skin is warm and dry.    ED Course  Procedures (including critical care time)  Labs Reviewed - No data to display Dg Shoulder Right  01/24/2013  *RADIOLOGY REPORT*  Clinical Data: Larey Seat while walking upstairs.  Generalized right shoulder pain.  RIGHT SHOULDER - 2+ VIEW  Comparison: None.  Findings: No evidence of acute or subacute fracture or glenohumeral dislocation.  Joint space narrowing involving the glenohumeral joint with hypertrophic spurring involving the glenoid and the humeral head.  Separation of the acromioclavicular joint, with the distal clavicle displaced superior to the acromion by one complete width of the clavicle.  Bone mineral density well preserved for age.  IMPRESSION: Acromioclavicular separation.  No evidence of acute fracture or glenohumeral  dislocation.  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   Original Report Authenticated By: Hulan Saas, M.D.      1. AC separation, type 4, right, initial encounter       MDM  X-rays reviewed and report per radiologist.   discussed with dr blackmon, as suggested.         Linna Hoff, MD 01/24/13 1524

## 2013-01-26 DIAGNOSIS — M25519 Pain in unspecified shoulder: Secondary | ICD-10-CM | POA: Diagnosis not present

## 2013-01-26 DIAGNOSIS — S4350XA Sprain of unspecified acromioclavicular joint, initial encounter: Secondary | ICD-10-CM | POA: Diagnosis not present

## 2013-02-21 DIAGNOSIS — E119 Type 2 diabetes mellitus without complications: Secondary | ICD-10-CM | POA: Diagnosis not present

## 2013-02-21 DIAGNOSIS — M25519 Pain in unspecified shoulder: Secondary | ICD-10-CM | POA: Diagnosis not present

## 2013-02-21 DIAGNOSIS — R634 Abnormal weight loss: Secondary | ICD-10-CM | POA: Diagnosis not present

## 2013-02-21 DIAGNOSIS — I10 Essential (primary) hypertension: Secondary | ICD-10-CM | POA: Diagnosis not present

## 2013-02-21 DIAGNOSIS — F319 Bipolar disorder, unspecified: Secondary | ICD-10-CM | POA: Diagnosis not present

## 2013-02-28 DIAGNOSIS — M25519 Pain in unspecified shoulder: Secondary | ICD-10-CM | POA: Diagnosis not present

## 2013-03-03 ENCOUNTER — Emergency Department (HOSPITAL_COMMUNITY)
Admission: EM | Admit: 2013-03-03 | Discharge: 2013-03-03 | Disposition: A | Discharge status: Other Acute Inpatient Hospital | Payer: Medicare Other | Source: Home / Self Care | Attending: Emergency Medicine | Admitting: Emergency Medicine

## 2013-03-03 ENCOUNTER — Encounter (HOSPITAL_COMMUNITY): Payer: Self-pay

## 2013-03-03 ENCOUNTER — Encounter (HOSPITAL_COMMUNITY): Payer: Self-pay | Admitting: *Deleted

## 2013-03-03 ENCOUNTER — Inpatient Hospital Stay (HOSPITAL_COMMUNITY)
Admission: AD | Admit: 2013-03-03 | Discharge: 2013-03-21 | DRG: 885 | Disposition: A | Discharge status: Home / Self Care | Payer: Medicare Other | Source: Intra-hospital | Attending: Psychiatry | Admitting: Psychiatry

## 2013-03-03 DIAGNOSIS — Z79899 Other long term (current) drug therapy: Secondary | ICD-10-CM | POA: Diagnosis not present

## 2013-03-03 DIAGNOSIS — F200 Paranoid schizophrenia: Principal | ICD-10-CM | POA: Diagnosis present

## 2013-03-03 DIAGNOSIS — D649 Anemia, unspecified: Secondary | ICD-10-CM | POA: Diagnosis present

## 2013-03-03 DIAGNOSIS — R7309 Other abnormal glucose: Secondary | ICD-10-CM | POA: Diagnosis present

## 2013-03-03 DIAGNOSIS — I10 Essential (primary) hypertension: Secondary | ICD-10-CM | POA: Diagnosis not present

## 2013-03-03 DIAGNOSIS — F209 Schizophrenia, unspecified: Secondary | ICD-10-CM

## 2013-03-03 DIAGNOSIS — R634 Abnormal weight loss: Secondary | ICD-10-CM | POA: Diagnosis not present

## 2013-03-03 DIAGNOSIS — F319 Bipolar disorder, unspecified: Secondary | ICD-10-CM | POA: Insufficient documentation

## 2013-03-03 LAB — URINALYSIS, ROUTINE W REFLEX MICROSCOPIC
Glucose, UA: NEGATIVE mg/dL
Hgb urine dipstick: NEGATIVE
Specific Gravity, Urine: 1.015 (ref 1.005–1.030)
pH: 5.5 (ref 5.0–8.0)

## 2013-03-03 LAB — CBC
MCH: 27.1 pg (ref 26.0–34.0)
MCV: 80.4 fL (ref 78.0–100.0)
Platelets: 202 10*3/uL (ref 150–400)
RBC: 3.58 MIL/uL — ABNORMAL LOW (ref 3.87–5.11)

## 2013-03-03 LAB — COMPREHENSIVE METABOLIC PANEL
AST: 12 U/L (ref 0–37)
BUN: 6 mg/dL (ref 6–23)
CO2: 27 mEq/L (ref 19–32)
Calcium: 9.3 mg/dL (ref 8.4–10.5)
Creatinine, Ser: 0.75 mg/dL (ref 0.50–1.10)
GFR calc Af Amer: >90 mL/min (ref >90)
GFR calc non Af Amer: 88 mL/min — ABNORMAL LOW (ref >90)

## 2013-03-03 LAB — ETHANOL: Alcohol, Ethyl (B): <11 mg/dL (ref 0–11)

## 2013-03-03 LAB — RAPID URINE DRUG SCREEN, HOSP PERFORMED
Amphetamines: NOT DETECTED
Opiates: NOT DETECTED

## 2013-03-03 LAB — URINE MICROSCOPIC-ADD ON

## 2013-03-03 LAB — SALICYLATE LEVEL: Salicylate Lvl: <2 mg/dL — ABNORMAL LOW (ref 2.8–20.0)

## 2013-03-03 MED ORDER — VALPROIC ACID 250 MG/5ML PO SYRP: 1500.0000 mg | ORAL_SOLUTION | Freq: Every day | ORAL | Status: DC

## 2013-03-03 MED ORDER — SERTRALINE HCL 100 MG PO TABS
100.0000 mg | ORAL_TABLET | Freq: Every day | ORAL | Status: DC
  Administered 2013-03-04 – 2013-03-13 (×10): 100 mg via ORAL
  Filled 2013-03-03 (×11): qty 1

## 2013-03-03 MED ORDER — TRAZODONE HCL 100 MG PO TABS
100.0000 mg | ORAL_TABLET | Freq: Every day | ORAL | Status: DC
  Administered 2013-03-03 – 2013-03-05 (×3): 100 mg via ORAL
  Filled 2013-03-03 (×5): qty 1

## 2013-03-03 MED ORDER — LORAZEPAM 1 MG PO TABS
1.0000 mg | ORAL_TABLET | Freq: Three times a day (TID) | ORAL | Status: DC | PRN
  Administered 2013-03-03 – 2013-03-09 (×5): 1 mg via ORAL
  Filled 2013-03-03 (×6): qty 1

## 2013-03-03 MED ORDER — SERTRALINE HCL 50 MG PO TABS
100.0000 mg | ORAL_TABLET | Freq: Every day | ORAL | Status: DC
  Administered 2013-03-03: 100 mg via ORAL

## 2013-03-03 MED ORDER — LORAZEPAM 1 MG PO TABS: 1.0000 mg | ORAL_TABLET | Freq: Three times a day (TID) | ORAL | Status: DC | PRN

## 2013-03-03 MED ORDER — ONDANSETRON HCL 4 MG PO TABS: 4.0000 mg | ORAL_TABLET | Freq: Three times a day (TID) | ORAL | Status: DC | PRN

## 2013-03-03 MED ORDER — IBUPROFEN 600 MG PO TABS: 600.0000 mg | ORAL_TABLET | Freq: Three times a day (TID) | ORAL | Status: DC | PRN

## 2013-03-03 MED ORDER — HYDROCHLOROTHIAZIDE 25 MG PO TABS: 25.0000 mg | ORAL_TABLET | Freq: Every day | ORAL | Status: DC

## 2013-03-03 MED ORDER — ALUM & MAG HYDROXIDE-SIMETH 200-200-20 MG/5ML PO SUSP: 30.0000 mL | ORAL | Status: DC | PRN

## 2013-03-03 MED ORDER — IRBESARTAN 150 MG PO TABS
150.0000 mg | ORAL_TABLET | Freq: Every day | ORAL | Status: DC
  Administered 2013-03-03: 150 mg via ORAL
  Filled 2013-03-03: qty 1

## 2013-03-03 MED ORDER — POTASSIUM CHLORIDE CRYS ER 20 MEQ PO TBCR: 20.0000 meq | EXTENDED_RELEASE_TABLET | Freq: Every day | ORAL | Status: DC

## 2013-03-03 MED ORDER — FLUTICASONE PROPIONATE 50 MCG/ACT NA SUSP
2.0000 | Freq: Every day | NASAL | Status: DC
  Administered 2013-03-10: 2 via NASAL
  Filled 2013-03-03 (×3): qty 16

## 2013-03-03 MED ORDER — POTASSIUM CHLORIDE CRYS ER 20 MEQ PO TBCR
20.0000 meq | EXTENDED_RELEASE_TABLET | Freq: Every day | ORAL | Status: DC
  Filled 2013-03-03 (×2): qty 1

## 2013-03-03 MED ORDER — RISPERIDONE 0.5 MG PO TABS: 0.2500 mg | ORAL_TABLET | Freq: Two times a day (BID) | ORAL | Status: DC

## 2013-03-03 MED ORDER — ACETAMINOPHEN 325 MG PO TABS
650.0000 mg | ORAL_TABLET | Freq: Four times a day (QID) | ORAL | Status: DC | PRN
  Administered 2013-03-11: 650 mg via ORAL

## 2013-03-03 MED ORDER — VALPROIC ACID 250 MG/5ML PO SYRP
1500.0000 mg | ORAL_SOLUTION | Freq: Every day | ORAL | Status: DC
  Filled 2013-03-03 (×2): qty 30

## 2013-03-03 MED ORDER — IRBESARTAN 150 MG PO TABS: 150.0000 mg | ORAL_TABLET | Freq: Every day | ORAL | Status: DC

## 2013-03-03 MED ORDER — MAGNESIUM HYDROXIDE 400 MG/5ML PO SUSP: 30.0000 mL | Freq: Every day | ORAL | Status: DC | PRN

## 2013-03-03 MED ORDER — TRAZODONE HCL 100 MG PO TABS: 100.0000 mg | ORAL_TABLET | Freq: Every day | ORAL | Status: DC

## 2013-03-03 MED ORDER — SERTRALINE HCL 50 MG PO TABS
100.0000 mg | ORAL_TABLET | Freq: Every day | ORAL | Status: DC
  Administered 2013-03-03: 100 mg via ORAL
  Filled 2013-03-03: qty 1

## 2013-03-03 MED ORDER — VALPROIC ACID 250 MG/5ML PO SYRP
1500.0000 mg | ORAL_SOLUTION | Freq: Every day | ORAL | Status: DC
  Administered 2013-03-04: 1500 mg via ORAL
  Filled 2013-03-03 (×3): qty 30

## 2013-03-03 MED ORDER — ZIPRASIDONE MESYLATE 20 MG IM SOLR
10.0000 mg | Freq: Once | INTRAMUSCULAR | Status: DC
  Filled 2013-03-03: qty 20

## 2013-03-03 MED ORDER — HYDROCHLOROTHIAZIDE 25 MG PO TABS
25.0000 mg | ORAL_TABLET | Freq: Once | ORAL | Status: AC
  Administered 2013-03-03: 25 mg via ORAL
  Filled 2013-03-03: qty 1

## 2013-03-03 MED ORDER — LORAZEPAM 2 MG/ML IJ SOLN
1.0000 mg | Freq: Once | INTRAMUSCULAR | Status: AC
  Administered 2013-03-03: 1 mg via INTRAMUSCULAR
  Filled 2013-03-03: qty 1

## 2013-03-03 MED ORDER — FLUTICASONE PROPIONATE 50 MCG/ACT NA SUSP
2.0000 | Freq: Every day | NASAL | Status: DC
  Administered 2013-03-03: 2 via NASAL
  Filled 2013-03-03: qty 16

## 2013-03-03 MED ORDER — ZIPRASIDONE MESYLATE 20 MG IM SOLR
INTRAMUSCULAR | Status: AC
  Filled 2013-03-03: qty 20

## 2013-03-03 MED ORDER — HYDROCHLOROTHIAZIDE 25 MG PO TABS
25.0000 mg | ORAL_TABLET | Freq: Every day | ORAL | Status: DC
  Administered 2013-03-04 – 2013-03-21 (×17): 25 mg via ORAL
  Filled 2013-03-03 (×20): qty 1

## 2013-03-03 MED ORDER — RISPERIDONE 0.5 MG PO TABS
0.2500 mg | ORAL_TABLET | Freq: Two times a day (BID) | ORAL | Status: DC
  Administered 2013-03-03: 0.25 mg via ORAL
  Filled 2013-03-03: qty 1

## 2013-03-03 MED ORDER — IRBESARTAN 150 MG PO TABS
150.0000 mg | ORAL_TABLET | Freq: Every day | ORAL | Status: DC
  Administered 2013-03-04 – 2013-03-21 (×18): 150 mg via ORAL
  Filled 2013-03-03 (×11): qty 1
  Filled 2013-03-03: qty 3
  Filled 2013-03-03 (×8): qty 1

## 2013-03-03 MED ORDER — RISPERIDONE 0.25 MG PO TABS
0.2500 mg | ORAL_TABLET | Freq: Two times a day (BID) | ORAL | Status: DC
  Administered 2013-03-03 – 2013-03-05 (×4): 0.25 mg via ORAL
  Filled 2013-03-03 (×9): qty 1

## 2013-03-03 MED ORDER — ACETAMINOPHEN 325 MG PO TABS: 650.0000 mg | ORAL_TABLET | ORAL | Status: DC | PRN

## 2013-03-03 MED ORDER — ZIPRASIDONE MESYLATE 20 MG IM SOLR
10.0000 mg | Freq: Once | INTRAMUSCULAR | Status: AC
  Administered 2013-03-03: 10 mg via INTRAMUSCULAR

## 2013-03-03 NOTE — ED Notes (Signed)
Pt is resting again but was able to sit up, take her medication and talk with this Clinical research associate. Signed into [facility] voluntarily and asked that her family be called so that they are aware. Pt given support, reassurance and praise.

## 2013-03-03 NOTE — ED Provider Notes (Addendum)
9:15 PM patient alert Glasgow Coma Score 15 [facility]ulatory [facility] Dr. Jannifer Franklin accepting psychiatrist Results for orders placed during the hospital encounter of 03/03/13  ACETAMINOPHEN LEVEL      Result Value Range   Acetaminophen (Tylenol), Serum <15.0  10 - 30 ug/mL  CBC      Result Value Range   WBC 5.9  4.0 - 10.5 K/uL   RBC 3.58 (*) 3.87 - 5.11 MIL/uL   Hemoglobin 9.7 (*) 12.0 - 15.0 g/dL   HCT 60.4 (*) 54.0 - 98.1 %   MCV 80.4  78.0 - 100.0 fL   MCH 27.1  26.0 - 34.0 pg   MCHC 33.7  30.0 - 36.0 g/dL   RDW 19.1  47.8 - 29.5 %   Platelets 202  150 - 400 K/uL  COMPREHENSIVE METABOLIC PANEL      Result Value Range   Sodium 141  135 - 145 mEq/L   Potassium 3.6  3.5 - 5.1 mEq/L   Chloride 105  96 - 112 mEq/L   CO2 27  19 - 32 mEq/L   Glucose, Bld 118 (*) 70 - 99 mg/dL   BUN 6  6 - 23 mg/dL   Creatinine, Ser 6.21  0.50 - 1.10 mg/dL   Calcium 9.3  8.4 - 30.8 mg/dL   Total Protein 6.5  6.0 - 8.3 g/dL   Albumin 3.1 (*) 3.5 - 5.2 g/dL   AST 12  0 - 37 U/L   ALT 7  0 - 35 U/L   Alkaline Phosphatase 64  39 - 117 U/L   Total Bilirubin 0.3  0.3 - 1.2 mg/dL   GFR calc non Af Amer 88 (*) >90 mL/min   GFR calc Af Amer >90  >90 mL/min  ETHANOL      Result Value Range   Alcohol, Ethyl (B) <11  0 - 11 mg/dL  SALICYLATE LEVEL      Result Value Range   Salicylate Lvl <2.0 (*) 2.8 - 20.0 mg/dL  URINE RAPID DRUG SCREEN (HOSP PERFORMED)      Result Value Range   Opiates NONE DETECTED  NONE DETECTED   Cocaine NONE DETECTED  NONE DETECTED   Benzodiazepines NONE DETECTED  NONE DETECTED   Amphetamines NONE DETECTED  NONE DETECTED   Tetrahydrocannabinol NONE DETECTED  NONE DETECTED   Barbiturates NONE DETECTED  NONE DETECTED  URINALYSIS, ROUTINE W REFLEX MICROSCOPIC      Result Value Range   Color, Urine YELLOW  YELLOW   APPearance CLEAR  CLEAR   Specific Gravity, Urine 1.015  1.005 - 1.030   pH 5.5  5.0 - 8.0   Glucose, UA NEGATIVE  NEGATIVE mg/dL   Hgb urine dipstick NEGATIVE   NEGATIVE   Bilirubin Urine NEGATIVE  NEGATIVE   Ketones, ur NEGATIVE  NEGATIVE mg/dL   Protein, ur NEGATIVE  NEGATIVE mg/dL   Urobilinogen, UA 0.2  0.0 - 1.0 mg/dL   Nitrite NEGATIVE  NEGATIVE   Leukocytes, UA SMALL (*) NEGATIVE  URINE MICROSCOPIC-ADD ON      Result Value Range   Squamous Epithelial / LPF FEW (*) RARE   WBC, UA 3-6  <3 WBC/hpf   Bacteria, UA FEW (*) RARE   No results found. Note HGB unchanged from 09/17/12  Doug Sou, MD 03/03/13 2122  Doug Sou, MD 03/03/13 2125

## 2013-03-03 NOTE — ED Notes (Signed)
Patient had 1 bag of belongings --- Given to cousin (caretaker) and placed it in car

## 2013-03-03 NOTE — ED Notes (Signed)
Patient is incoherent with conversation. Able to follow commands.

## 2013-03-03 NOTE — ED Provider Notes (Signed)
History     CSN: [id number]  Arrival date & time 03/03/13  1028   First MD Initiated Contact with Patient 03/03/13 1030      Chief Complaint  Patient presents with  . Medical Clearance    (Consider location/radiation/quality/duration/timing/severity/associated sxs/prior treatment) HPI Comments: Patient with a history of Schizophrenic disorder brought in today by her niece due to agitation and hallucinations.  Symptoms have been present since yesterday and gradually worsening.  Niece reports that the patient goes to [facility].  Niece reports that the patient has not been compliant with her medications.  Niece states that the patient has not made any homicidal or suicidal comments.  She has been hospitalized by Behavioral Health in the past.    History provided by: niece.    Past Medical History  Diagnosis Date  . Hypertension   . Pre-diabetes   . Bipolar affective disorder   . Schizophrenic disorder     No past surgical history on file.  Family History  Problem Relation Age of Onset  . Hypertension Mother   . Colon cancer Mother   . Diabetes Mother   . Hypertension Father     History  Substance Use Topics  . Smoking status: Never Smoker   . Smokeless tobacco: Not on file  . Alcohol Use: No     Comment: Pt denies     OB History   Grav Para Term Preterm Abortions TAB SAB Ect Mult Living                  Review of Systems  Unable to perform ROS: Psychiatric disorder    Allergies  Review of patient's allergies indicates no known allergies.  Home Medications   Current Outpatient Rx  Name  Route  Sig  Dispense  Refill  . fluticasone (FLONASE) 50 MCG/ACT nasal spray   Nasal   Place 2 sprays into the nose daily. For seasonal allergies.   16 g   0   . hydrochlorothiazide (HYDRODIURIL) 25 MG tablet   Oral   Take 1 tablet (25 mg total) by mouth daily. For blood pressure control.   30 tablet   0   . HYDROcodone-acetaminophen (NORCO/VICODIN) 5-325 MG per  tablet   Oral   Take 1 tablet by mouth every 4 (four) hours as needed for pain.   15 tablet   0   . irbesartan (AVAPRO) 150 MG tablet   Oral   Take 1 tablet (150 mg total) by mouth daily. For blood pressure control.   30 tablet   0   . lurasidone (LATUDA) 80 MG TABS   Oral   Take 1 tablet (80 mg total) by mouth 2 (two) times daily in the am and at bedtime.. For psychosis.   60 tablet   0   . potassium chloride SA (K-DUR,KLOR-CON) 20 MEQ tablet   Oral   Take 1 tablet (20 mEq total) by mouth daily with breakfast. For hypokalemia.   30 tablet   0   . sertraline (ZOLOFT) 100 MG tablet   Oral   Take 1 tablet (100 mg total) by mouth daily. For anxiety and depression.   30 tablet   0   . traZODone (DESYREL) 100 MG tablet   Oral   Take 1 tablet (100 mg total) by mouth at bedtime.   30 tablet   1     BP 139/73  Pulse 89  Temp(Src) 98.1 F (36.7 C) (Oral)  Resp 22  SpO2 99%  Physical Exam  Nursing note and vitals reviewed. Constitutional: She appears well-developed and well-nourished.  HENT:  Head: Normocephalic and atraumatic.  Mouth/Throat: Oropharynx is clear and moist.  Eyes: EOM are normal. Pupils are equal, round, and reactive to light.  Neck: Normal range of motion. Neck supple.  Cardiovascular: Normal rate, regular rhythm and normal heart sounds.   Pulmonary/Chest: Effort normal and breath sounds normal.  Neurological: She is alert.  Skin: Skin is warm and dry.  Psychiatric: Her mood appears anxious. Her speech is rapid and/or pressured. She is agitated and actively hallucinating. Thought content is paranoid.    ED Course  Procedures (including critical care time)  Labs Reviewed  ACETAMINOPHEN LEVEL  CBC  COMPREHENSIVE METABOLIC PANEL  ETHANOL  SALICYLATE LEVEL  URINE RAPID DRUG SCREEN (HOSP PERFORMED)   No results found.   No diagnosis found.  11:03 AM Patient given Geodon IM due to agitation  MDM  Patient brought in today by niece due to  agitation and hallucinations.  She has a history of Schizophrenia and has been noncompliant with medications.  Patient found to be agitated and paranoid in the ED.  Patient given IM doses of Geodon.  Labs unremarkable.  ACT team has evaluated the patient.  Psych holding orders placed.  Home medications ordered.        Pascal Lux Ponshewaing, PA-C 03/03/13 [phone]

## 2013-03-03 NOTE — ED Notes (Signed)
Patient in blue scrubs and red socks.  

## 2013-03-03 NOTE — ED Provider Notes (Signed)
Medical screening examination/treatment/procedure(s) were conducted as a shared visit with non-physician practitioner(s) and myself.  I personally evaluated the patient during the encounter  Pt with acute pyschosis, will give geodon and await medical clearence  Toy Baker, MD 03/03/13 1056

## 2013-03-03 NOTE — BHH Counselor (Addendum)
Pt referred to [facility], [facility], and [facility]; pending review.

## 2013-03-03 NOTE — ED Notes (Signed)
Pt was brought back to the psych ED and was given a drink of Sprite. Pt then lied down and said she was going to sleep. Presently is sleeping. Vitals deferred until Pt wakes up. Breathing unlabored.

## 2013-03-03 NOTE — ED Notes (Signed)
Pt verbalizes understadig

## 2013-03-03 NOTE — BH Assessment (Signed)
[facility] Assessment Progress Note      Consulted Shuvon Rankin NP re admission to [facility] for this [facility] patient. She has been accepted to the service of Dr. Jannifer Franklin to room 405-1 when it becomes available later this pm.

## 2013-03-03 NOTE — ED Notes (Signed)
Pt yelling in room, talking to self, combative to staff, yelling and cussing towards staff, 10mg  geodon given and 1mg  ativan given.

## 2013-03-03 NOTE — ED Notes (Signed)
Patient is yelling and stating that she will not give blood and that she's cold.Unable to gain control of herself. Repetitive spitting into a napkin. Geodon injection to the right deltoid. Food given along with drink. Appears to be slowly calming down.

## 2013-03-03 NOTE — BHH Counselor (Addendum)
Kaylee Rankin, NP accepted patient to [facility] to Dr. Jannifer Franklin. The room assignment is 405-1 when it becomes available later this pm. [facility] assessment office staff will inform ACT when bed becomes available. EDP-Sam Rennis Chris will be notified when patient is ready to transfer from [facility] to [facility]. Patient's nurse-Christine made aware of patients discharge. The nursing report # is [phone]. Writer has completed all support paperwork and faxed to [facility] assessment office. Patient is here voluntarily and will be transferred via hospital security.

## 2013-03-03 NOTE — BH Assessment (Signed)
[facility] Assessment Progress Note   Writer contacted patient's niece, Kaylee Kelley [phone] for collateral information. Kaylee Kelley is also patient's caregiver but does not have legal POA. She has been living with her niece and her niece's children for the past 16 months. Prior to living with her neice patient was living in [place] with a sister. Per Kaylee Kelley, patient has not been taking her medications. Says that the last time patient took her medications was the day before yesterday. Patient is also not eating well and easily irritated. Her behaviors and willingness to  independently complete ADL's also fluctuates. Patient is also isolating herself from others and refusing to go outside the home. Her niece sts, "She will have several good weeks and without reason will decline by refusing to take meds, eat, sleep, etc. Her niece is not sure what triggers patient's moods or behaviors, however; feels that having a household of people mainly teenagers could be an issue. Kaylee Kelley has (2) teenage girls and (1) 30 y/o son@ home from college. Sts that their is another female teenage cousin in the home. Kaylee Kelley, "All the people in the home could be a trigger as Kaylee Kelley is use to living alone and in peace".  Patient's niece denies that patient has made comments regarding SI or HI. Says that she has only noticed anger, irritability, and frustration. No physical outburst only verbal. Says that patient has lately been using a lot of profanity.   Patient has a history of auditory hallucinations, per her niece. Patient apparently tells family that the voices are always talking to her and will not stop. Kaylee Kelley is unclear if voices are command type. Patient is currently receiving outpatient services with [facility]. Says that patient has someone from [place] that will come and do home visits. Patient has been hospitalized at [facility] (2x's) and also yrs ago in [facility]. Pt's PCP is with the [facility]

## 2013-03-03 NOTE — ED Notes (Signed)
Pt aaox3.  Pt calm and cooperative.  Pt denies SI/HI/AH at this time.  Pt reports "I live with my niece."  Pt has speech impairment which makes it difficult to assess.

## 2013-03-03 NOTE — ED Notes (Signed)
Patient presents today with her niece because she is not eating and not taking her medications. Has been combative.

## 2013-03-03 NOTE — Progress Notes (Addendum)
67 year old female pt admitted on voluntary basis. On admission, pt initially attentive and able to sign admission paperwork and answer some questions. Pt did speak about how she has been living with her niece and how there were a lot of kids in the house right now. Pt denied any pain, and denied any SI. About halfway through the process pt began spitting in her napkin. Pt was initially doing this on admission but slowly increased as the admission progressed and got to the point where pt was no longer able to participate in admission process and when pt was admitted to her room, pt did begin to scream and to yell and was constantly spitting in her napkin. Pt was oriented to the unit and safety maintained and comfort measures provided for patient.

## 2013-03-03 NOTE — ED Notes (Signed)
Attempts were made at getting Pt's vital signs but was unable to due to Pt being cold. Was uncooperative with holding her arm steady and continue attempting to get the blankets around her.Marland Kitchen

## 2013-03-03 NOTE — ED Notes (Signed)
Patient wanded by security. 

## 2013-03-03 NOTE — Tx Team (Signed)
Initial Interdisciplinary Treatment Plan  PATIENT STRENGTHS: (choose at least two) Communication skills Supportive family/friends  PATIENT STRESSORS: Medication change or noncompliance   PROBLEM LIST: Problem List/Patient Goals Date to be addressed Date deferred Reason deferred Estimated date of resolution  psychosis 03/03/13                                                      DISCHARGE CRITERIA:  Ability to meet basic life and health needs Improved stabilization in mood, thinking, and/or behavior  PRELIMINARY DISCHARGE PLAN: Attend aftercare/continuing care group Return to previous living arrangement  PATIENT/FAMIILY INVOLVEMENT: This treatment plan has been presented to and reviewed with the patient, Charese Abundis, and/or family member, .  The patient and family have been given the opportunity to ask questions and make suggestions.  Uchenna Seufert, [place] 03/03/2013, 10:38 PM

## 2013-03-03 NOTE — BH Assessment (Addendum)
Assessment Note   Kaylee Kelley is an 67 y.o. female with history of Schizophrenia Disorder and Bipolar Affective Disorder was brought to the ED by her niece/caregiver. Patient was reportedly "Yelling in room, talking to self, combative to staff, yelling and cussing towards staff, 10mg  geodon given and 1mg  ativan given".  Patient's niece reports agitation and hallucinations and non-compliance with medications.   Write met with patient to complete a [facility] assessment. Patient was calm and cooperative during the assessment.  Patient was very difficult to understand as speech was garbled. After attempts to listen carefully and asking patient to repeat answers she denied SI and HI. She admits that she does here voices, however; unable to make up what they are saying. She says, "sounds like a lot of people talking". Patient denies depression or related stressors. No alcohol or drug use reported.   Additional notes: Due to difficulty with communication with patient riter contacted the niece, Avon Gully [phone] for collateral information. [name] is not only patient's niece but also her caregiver. [name] does does not have legal POA. She has been living with her niece and her niece's children for the past 16 months. Prior to living with her neice patient was living in [place] with a sister. Per [name], patient has not been taking her medications. Says that the last time patient took her medications was the day before yesterday. Patient is also not eating well and easily irritated. Her behaviors and willingness to independently complete ADL's also fluctuates. Patient is also isolating herself from others and refusing to go outside the home. Her niece sts, "She will have several good weeks and without reason will decline by refusing to take meds, eat, sleep, etc. Her niece is not sure what triggers patient's moods or behaviors, however; feels that having a household of people mainly teenagers could be an issue. [name]  has (2) teenage girls and (1) 27 y/o son@ home from college. Sts that their is another female teenage cousin in the home. Derl Barrow, "All the people in the home could be a trigger as Wilder is use to living alone and in peace".   Patient's niece denies that patient has made comments regarding SI or HI. Says that she has only noticed anger, irritability, and frustration. No physical outburst only verbal. Says that patient has lately been using a lot of profanity.   Patient has a history of auditory hallucinations, per her niece. Patient apparently tells family that the voices are always talking to her and will not stop. [name] is unclear if voices are command type. Patient is currently receiving outpatient services with [facility]. Says that patient has someone from [place] that will come and do home visits. Patient has been hospitalized at [facility] (2x's) and also yrs ago in [facility]. Pt's PCP is with the [facility].      Axis I: Schizophrenic Disorder; Bipolar Affective Disorder Axis II: Deferred Axis III:  Past Medical History  Diagnosis Date  . Hypertension   . Pre-diabetes   . Bipolar affective disorder   . Schizophrenic disorder    Axis IV: other psychosocial or environmental problems, problems related to social environment and problems with primary support group Axis V: 31-40 impairment in reality testing  Past Medical History:  Past Medical History  Diagnosis Date  . Hypertension   . Pre-diabetes   . Bipolar affective disorder   . Schizophrenic disorder     History reviewed. No pertinent past surgical history.  Family History:  Family History  Problem Relation Age of Onset  . Hypertension Mother   . Colon cancer Mother   . Diabetes Mother   . Hypertension Father     Social History:  reports that she has never smoked. She does not have any smokeless tobacco history on file. She reports that she does not drink alcohol or use illicit drugs.  Additional Social  History:     CIWA: CIWA-Ar BP: 139/73 mmHg Pulse Rate: 89 COWS:    Allergies: No Known Allergies  Home Medications:  (Not in a hospital admission)  OB/GYN Status:  No LMP recorded. Patient is postmenopausal.  General Assessment Data Location of Assessment: [facility] ED Living Arrangements: Other (Comment);Other relatives (lives with niece and niece's children) Can pt return to current living arrangement?: Yes Admission Status: Voluntary Is patient capable of signing voluntary admission?: Yes Transfer from: Acute Hospital Referral Source: Self/Family/Friend     Risk to self Suicidal Ideation: No Suicidal Intent: No Is patient at risk for suicide?: No Suicidal Plan?: No Access to Means: No What has been your use of drugs/alcohol within the last 12 months?:  (n/a) Previous Attempts/Gestures: No How many times?:  (0) Other Self Harm Risks:  (n/a) Triggers for Past Attempts:  (no previous attempts noted) Intentional Self Injurious Behavior: None Family Suicide History: Unknown Recent stressful life event(s): Other (Comment) (patient denies having any stressors at this time) Persecutory voices/beliefs?: No Depression: Yes Depression Symptoms: Feeling angry/irritable;Loss of interest in usual pleasures Substance abuse history and/or treatment for substance abuse?: No Suicide prevention information given to non-admitted patients: Not applicable  Risk to Others Homicidal Ideation: No Thoughts of Harm to Others: No Current Homicidal Intent: No Current Homicidal Plan: No Access to Homicidal Means: No Identified Victim:  (n/a) History of harm to others?: No Assessment of Violence: None Noted Violent Behavior Description:  (Patient is calm and cooperative) Does patient have access to weapons?: No Criminal Charges Pending?: No Does patient have a court date: No  Psychosis Hallucinations: Auditory (Per niece auditory hallucinations of voices talking to patie) Delusions:  Unspecified (Pt says she is unable to make out the voices)  Mental Status Report Appear/Hygiene: Disheveled Eye Contact: Fair Motor Activity: Freedom of movement;Agitation Speech: Logical/coherent Level of Consciousness: Alert Mood: Depressed Affect: Appropriate to circumstance Anxiety Level: None Thought Processes: Coherent Judgement: Unimpaired Orientation: Person;Place;Time;Situation Obsessive Compulsive Thoughts/Behaviors: None  Cognitive Functioning Concentration: Decreased Memory: Remote Intact;Recent Intact IQ: Average Insight: Fair Impulse Control: Fair Appetite: Poor Weight Loss:  (none reported) Weight Gain:  (none reported) Sleep: Decreased Total Hours of Sleep:  (varies; not sleeping well lately) Vegetative Symptoms: None  ADLScreening [facility] Assessment Services) Patient's cognitive ability adequate to safely complete daily activities?: Yes Patient able to express need for assistance with ADLs?: Yes Independently performs ADLs?: Yes (appropriate for developmental age)  Abuse/Neglect [facility]) Physical Abuse: Denies Verbal Abuse: Denies Sexual Abuse: Denies  Prior Inpatient Therapy Prior Inpatient Therapy: Yes Prior Therapy Dates:  [facility] 09/18/2012 and 05/21/2012; facility in [place] yrs ago) Prior Therapy Facilty/Provider(s):  [facility] and facility in [place]) Reason for Treatment:  (psychosis, medication managment, depression)  Prior Outpatient Therapy Prior Outpatient Therapy: Yes Prior Therapy Dates:  (currently ) Prior Therapy Facilty/Provider(s):  [facility]) Reason for Treatment:  (medication management; possible ACT team(pending clarificati)  ADL Screening (condition at time of admission) Patient's cognitive ability adequate to safely complete daily activities?: Yes Patient able to express need for assistance with ADLs?: Yes Independently performs ADLs?: Yes (appropriate for developmental age)  Abuse/Neglect Assessment (Assessment to be complete while patient  is alone) Physical Abuse: Denies Verbal Abuse: Denies Sexual Abuse: Denies Values / Beliefs Cultural Requests During Hospitalization: None Spiritual Requests During Hospitalization: None        Additional Information 1:1 In Past 12 Months?: No CIRT Risk: No Elopement Risk: No Does patient have medical clearance?: Yes     Disposition:  Disposition Initial Assessment Completed for this Encounter: Yes Disposition of Patient: Inpatient treatment program Type of inpatient treatment program: Adult  On Site Evaluation by:   Reviewed with Physician:     Octaviano Batty 03/03/2013 3:05 PM

## 2013-03-04 ENCOUNTER — Encounter (HOSPITAL_COMMUNITY): Payer: Self-pay | Admitting: Psychiatry

## 2013-03-04 DIAGNOSIS — I10 Essential (primary) hypertension: Secondary | ICD-10-CM | POA: Insufficient documentation

## 2013-03-04 DIAGNOSIS — D649 Anemia, unspecified: Secondary | ICD-10-CM | POA: Diagnosis present

## 2013-03-04 MED ORDER — POTASSIUM CHLORIDE 20 MEQ/15ML (10%) PO LIQD
20.0000 meq | Freq: Every day | ORAL | Status: DC
  Administered 2013-03-04 – 2013-03-05 (×2): 20 meq via ORAL
  Filled 2013-03-04 (×6): qty 15

## 2013-03-04 MED ORDER — ZIPRASIDONE MESYLATE 20 MG IM SOLR: 20.0000 mg | Freq: Once | INTRAMUSCULAR | Status: DC

## 2013-03-04 MED ORDER — ZIPRASIDONE MESYLATE 20 MG IM SOLR
10.0000 mg | Freq: Once | INTRAMUSCULAR | Status: DC
  Filled 2013-03-04: qty 20

## 2013-03-04 NOTE — Progress Notes (Signed)
Patient ID: Kaylee Kelley, female   DOB: 11-Mar-1946, 67 y.o.   MRN: [id number] D. The patient isolated in her room all evening. Observed talking to herself in a very high pitched tone of voice. Appears to be having a conversation with someone who is not there. Refused lab work. Threw her dinner tray at the wall.  A. Met with patient 1:1 Tried to encourage her to attend evening wrap up group. Explained medications. Attempted to administer medication. R. The patient did not attend evening wrap up group. Only took a portion of her Depakene. Started yelling and refused the rest of it. Does not respond well to redirection. Needs a lot of nurturing and space.

## 2013-03-04 NOTE — Progress Notes (Signed)
Patient ID: Kaylee Kelley, female   DOB: 02-05-1946, 67 y.o.   MRN: [id number] Psychoeducational Group Note  Date:  03/04/2013 Time:1000am  Group Topic/Focus:  Identifying Needs:   The focus of this group is to help patients identify their personal needs that have been historically problematic and identify healthy behaviors to address their needs.  Participation Level:  Did Not Attend  Participation Quality:    Affect:   Cognitive: Insight:    Engagement in Group:   Additional Comments:  Inventory group   Valente David 03/04/2013,10:13 AM

## 2013-03-04 NOTE — Progress Notes (Signed)
Patient ID: Kaylee Kelley, female   DOB: [birth year], 67 y.o.   MRN: [id number] 03-04-13 nursing shift note: D: this patient was very irritable this am. She did take her medications except her potassium. Communication is difficult with her because her english is hard to understand. She didn't fill out her inventory sheet and is not going to groups A: will continue to support and encourage her. Offered to help her with her inventory sheet yet she still refused to cooperate. Will attempt to give her the potassium later in the day. Obtained the liquid potassium, thinking it would be a good alternative and she drank it then spit it out and charted this accordingly. She is having meals in her room due to behaviors. R: she remains disorganized. RN will monitor and Q 15 min ck's continue.

## 2013-03-04 NOTE — Progress Notes (Signed)
Psychoeducational Group Note  Date:  03/04/2013 Time:  1000  Group Topic/Focus:  Identifying Needs:   The focus of this group is to help patients identify their personal needs that have been historically problematic and identify healthy behaviors to address their needs.  Participation Level: Did Not Attend  Participation Quality:  Not Applicable  Affect:  Not Applicable  Cognitive:  Not Applicable  Insight:  Not Applicable  Engagement in Group: Not Applicable  Additional Comments:    Rich Brave 03/04/2013, 11:41 AM

## 2013-03-04 NOTE — [facility] Group Notes (Signed)
[facility] Group Notes: (Clinical Social Work)   03/04/2013      Type of Therapy:  Group Therapy   Participation Level:  Did Not Attend    Ambrose Mantle, LCSW 03/04/2013, 12:58 PM
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
## 2013-03-04 NOTE — [facility] Suicide Risk Assessment (Signed)
Suicide Risk Assessment  Admission Assessment     Nursing information obtained from:  Patient Demographic factors:  Unemployed Current Mental Status:  NA Loss Factors:  NA Historical Factors:  Family history of mental illness or substance abuse Risk Reduction Factors:  Sense of responsibility to family;Living with another person, especially a relative;Positive social support  CLINICAL FACTORS:   Schizophrenia:   Paranoid or undifferentiated type Medical Diagnoses and Treatments/Surgeries  COGNITIVE FEATURES THAT CONTRIBUTE TO RISK:  Loss of executive function    SUICIDE RISK:   Moderate:  Frequent suicidal ideation with limited intensity, and duration, some specificity in terms of plans, no associated intent, good self-control, limited dysphoria/symptomatology, some risk factors present, and identifiable protective factors, including available and accessible social support.  PLAN OF CARE: Supportive approach                              Will get more information                              Manage medical co morbidites  I certify that inpatient services furnished can reasonably be expected to improve the patient's condition.  Angelyna Henderson A 03/04/2013, 3:24 PM

## 2013-03-05 DIAGNOSIS — D649 Anemia, unspecified: Secondary | ICD-10-CM

## 2013-03-05 DIAGNOSIS — F200 Paranoid schizophrenia: Secondary | ICD-10-CM

## 2013-03-05 DIAGNOSIS — R634 Abnormal weight loss: Secondary | ICD-10-CM

## 2013-03-05 DIAGNOSIS — I10 Essential (primary) hypertension: Secondary | ICD-10-CM

## 2013-03-05 LAB — FERRITIN: Ferritin: 126 ng/mL (ref 10–291)

## 2013-03-05 LAB — URINE CULTURE

## 2013-03-05 LAB — IRON AND TIBC: UIBC: 205 ug/dL (ref 125–400)

## 2013-03-05 LAB — OCCULT BLOOD X 1 CARD TO LAB, STOOL: Fecal Occult Bld: NEGATIVE

## 2013-03-05 MED ORDER — RISPERIDONE 2 MG PO TABS
2.0000 mg | ORAL_TABLET | Freq: Every day | ORAL | Status: DC
  Administered 2013-03-05 – 2013-03-08 (×4): 2 mg via ORAL
  Filled 2013-03-05 (×6): qty 1

## 2013-03-05 MED ORDER — VALPROIC ACID 250 MG/5ML PO SYRP
1500.0000 mg | ORAL_SOLUTION | Freq: Every day | ORAL | Status: DC
  Administered 2013-03-05: 1500 mg via ORAL
  Filled 2013-03-05 (×2): qty 30

## 2013-03-05 MED ORDER — DIVALPROEX SODIUM 125 MG PO CPSP
1500.0000 mg | ORAL_CAPSULE | Freq: Every day | ORAL | Status: DC
  Filled 2013-03-05 (×2): qty 12

## 2013-03-05 MED ORDER — LORAZEPAM 1 MG PO TABS
1.0000 mg | ORAL_TABLET | Freq: Once | ORAL | Status: AC
  Administered 2013-03-05: 1 mg via ORAL
  Filled 2013-03-05: qty 1

## 2013-03-05 NOTE — Progress Notes (Signed)
Patient ID: [name], female   DOB: 07-02-1946, 67 y.o.   MRN: [id number] Psychoeducational Group Note  Date:  03/05/2013 Time:  1000am  Group Topic/Focus:  Making Healthy Choices:   The focus of this group is to help patients identify negative/unhealthy choices they were using prior to admission and identify positive/healthier coping strategies to replace them upon discharge.  Participation Level:  Did Not Attend  Participation Quality:    Affect:   Cognitive:   Insight:  Engagement in Group: Additional Comments:  Inventory and Psychoeducational group   Valente David 03/05/2013,10:23 AM

## 2013-03-05 NOTE — Progress Notes (Signed)
Report received from B. McNichols RN. Writer entered patients room and observed her lying in bed asleep respirations even, no distress noted. Safety maintained with 15 min checks, will continue to monitor.

## 2013-03-05 NOTE — Progress Notes (Signed)
Patient ID: Kaylee Kelley, female   DOB: [birth year], 67 y.o.   MRN: [id number] 03-05-13 @1326  nursing shift note: D: pt medications were given later in the am because she stated that her "MD told her to take her medications after she eats". She continues to be disorganized and difficult to understand.  This pt continues to have some intermittent anxiety/agitation. A: RN waited until after her breakfast and administered the medications to her at about 0900. She also got an ativan prn to help with her anxiety/agitation. R:  Her scheduled Risperdal with the ativan prn seems to be effective. RN will continue to monitor and Q 15 min ck's continue.

## 2013-03-05 NOTE — Progress Notes (Addendum)
[facility] MD Progress Note  03/05/2013 1:58 PM Kaylee Kelley  MRN:  [id number] Subjective:  "I'm cold."  Objective: Patient is in bed, shivering, doesn't want to talk, is slightly more intelligible today. She is a little more cooperative today than yesterday. But, she has refused labs, threw her tray at the wall. Diagnosis:  Schizophrenia paranoid type, acute exacerbation                      Medical non compliant                      Anemia                      Weight loss  ADL's:  Impaired  Sleep: Poor  2.25 Appetite:  Good  Suicidal Ideation:  UTA Homicidal Ideation:  UTA AEB (as evidenced by): patient's report, observation.  Psychiatric Specialty Exam: Review of Systems  Unable to perform ROS   Blood pressure 122/74, pulse 84, temperature 98.5 F (36.9 C), temperature source Oral, resp. rate 18, height 5\' 1"  (1.549 m), weight 42.185 kg (93 lb), SpO2 100.00%.Body mass index is 17.58 kg/(m^2).  General Appearance: Disheveled  Eye Contact::  Poor  Speech:  Garbled  Volume:  Normal  Mood:  Irritable  Affect:  Labile  Thought Process:  Disorganized  Orientation:  NA  Thought Content:  NA  Suicidal Thoughts:  No  Homicidal Thoughts:  No  Memory:  NA  Judgement:  Impaired  Insight:  Lacking  Psychomotor Activity:  Normal  Concentration:  poor  Recall:  NA  Akathisia:  No  Handed:  Right  AIMS (if indicated):     Assets:  Financial Resources/Insurance Housing Social Support  Sleep:  Number of Hours: 2.25   Current Medications: Current Facility-Administered Medications  Medication Dose Route Frequency Provider Last Rate Last Dose  . acetaminophen (TYLENOL) tablet 650 mg  650 mg Oral Q6H PRN Shuvon Rankin, NP      . alum & mag hydroxide-simeth (MAALOX/MYLANTA) 200-200-20 MG/5ML suspension 30 mL  30 mL Oral Q4H PRN Shuvon Rankin, NP      . divalproex (DEPAKOTE SPRINKLE) capsule 1,500 mg  1,500 mg Oral QHS Mojeed Akintayo      . fluticasone (FLONASE) 50 MCG/ACT nasal spray  2 spray  2 spray Each Nare Daily Shuvon Rankin, NP      . hydrochlorothiazide (HYDRODIURIL) tablet 25 mg  25 mg Oral Daily Shuvon Rankin, NP   25 mg at 03/05/13 0900  . irbesartan (AVAPRO) tablet 150 mg  150 mg Oral Daily Shuvon Rankin, NP   150 mg at 03/05/13 0900  . LORazepam (ATIVAN) tablet 1 mg  1 mg Oral Q8H PRN Shuvon Rankin, NP   1 mg at 03/05/13 0858  . magnesium hydroxide (MILK OF MAGNESIA) suspension 30 mL  30 mL Oral Daily PRN Shuvon Rankin, NP      . potassium chloride 20 MEQ/15ML (10%) liquid 20 mEq  20 mEq Oral Daily Mojeed Akintayo   20 mEq at 03/04/13 1212  . risperiDONE (RISPERDAL) tablet 0.25 mg  0.25 mg Oral BID Shuvon Rankin, NP   0.25 mg at 03/05/13 0858  . sertraline (ZOLOFT) tablet 100 mg  100 mg Oral Daily Shuvon Rankin, NP   100 mg at 03/05/13 0901  . traZODone (DESYREL) tablet 100 mg  100 mg Oral QHS Shuvon Rankin, NP   100 mg at 03/04/13 2047  . ziprasidone (GEODON) injection 10  mg  10 mg Intramuscular Once Rachael Fee, MD        Lab Results:  Results for orders placed during the hospital encounter of 03/03/13 (from the past 48 hour(s))  FERRITIN     Status: None   Collection Time    03/05/13  6:30 AM      Result Value Range   Ferritin 126  10 - 291 ng/mL  TSH     Status: None   Collection Time    03/05/13  6:30 AM      Result Value Range   TSH 1.405  0.350 - 4.500 uIU/mL  VALPROIC ACID LEVEL     Status: Abnormal   Collection Time    03/05/13  6:30 AM      Result Value Range   Valproic Acid Lvl 28.4 (*) 50.0 - 100.0 ug/mL    Physical Findings: AIMS: Facial and Oral Movements Muscles of Facial Expression: None, normal Lips and Perioral Area: None, normal Jaw: None, normal Tongue: None, normal,Extremity Movements Upper (arms, wrists, hands, fingers): None, normal Lower (legs, knees, ankles, toes): None, normal, Trunk Movements Neck, shoulders, hips: None, normal, Overall Severity Severity of abnormal movements (highest score from questions above):  None, normal Incapacitation due to abnormal movements: None, normal Patient's awareness of abnormal movements (rate only patient's report): No Awareness, Dental Status Current problems with teeth and/or dentures?: No Does patient usually wear dentures?: Yes  CIWA:    COWS:     Treatment Plan Summary: Daily contact with patient to assess and evaluate symptoms and progress in treatment Medication management  Plan: 1. Continue crisis management and stabilization. 2. Medication management to reduce current symptoms to base line and improve patient's overall level of functioning 3. Treat health problems as indicated. 4. Develop treatment plan to decrease risk of relapse upon discharge and the need for readmission. 5. Psycho-social education regarding relapse prevention and self care. 6. Health care follow up as needed for medical problems. 7. Consider the possible need for a higher level of care given her multiple admissions, weight loss, and anemia. New items:  8. Risperdal increased to 2mg  at hs given that her last admission she needed Latuda 80mg  BID.  9. Results of labs reviewed and discussed with Dr. Blake Divine who will evaluate this patient for anemia and weight loss.  10.ELOS: 5-7 days  11. Stool for occult blood is ordered per Dr. Blake Divine. Medical Decision Making Problem Points:  Established problem, stable/improving (1) and New problem, with additional work-up planned (4) Data Points:  Review or order medicine tests (1) Review and summation of old records (2) Review of new medications or change in dosage (2)  I certify that inpatient services furnished can reasonably be expected to improve the patient's condition.  Rona Ravens. Berlyn Malina RPAC 2:07 PM 03/05/2013

## 2013-03-05 NOTE — Consult Note (Signed)
[facility] Medical Consultation  Kaylee Kelley MVH:[id number] DOB: 1945-11-09 DOA: 03/03/2013 PCP: No primary provider on file.   Requesting physician: Verne Spurr Date of consultation: 03/05/13 Reason for consultation: weight loss  Impression/Recommendations Active Problems:   Anemia    1. Anemia:normocytic anemia. un reliable history from the patient. Start work up with anemia panel. Stool for occult blood. UA.  2.  weight loss of more then 30 lbs in 6 months: most likely secondary to psychosis leading to poor po intake/ possibly intentional. We will get pro albumin level. tsh within normal limits.  She will need a mammogram, baseline colonoscopy as outpatient. No palpable lymphadenopathy. Nutritionist consult.  3. Schizophrenia/ Psychosis: further management as per psychiatry.  4. Hypertension: well controlled. Resume home medications.  5. PT/OT eval.   I will followup again tomorrow. Please contact me if I can be of assistance in the meanwhile. Thank you for this consultation.  Chief Complaint: admitted for psychosis.   HPI:  67 year old lady with h/o schizophrenia, medication non compliance, was admitted for psychosis. She was found to have lost > 30lbs in 6 months. She denies any fevers, chills , nausea or vomiting. She denies diarrhea, or abdominal pain. No headaches.   Review of Systems:  See HPI.  Past Medical History  Diagnosis Date  . Hypertension   . Pre-diabetes   . Bipolar affective disorder   . Schizophrenic disorder    History reviewed. No pertinent past surgical history. Social History:  reports that she has never smoked. She does not have any smokeless tobacco history on file. She reports that she does not drink alcohol or use illicit drugs.  No Known Allergies Family History  Problem Relation Age of Onset  . Hypertension Mother   . Colon cancer Mother   . Diabetes Mother   . Hypertension Father     Prior to Admission medications    Medication Sig Start Date End Date Taking? Authorizing Provider  fluticasone (FLONASE) 50 MCG/ACT nasal spray Place 2 sprays into the nose daily. For seasonal allergies. 06/07/12 06/07/13  Curlene Labrum Readling, MD  hydrochlorothiazide (HYDRODIURIL) 25 MG tablet Take 1 tablet (25 mg total) by mouth daily. For blood pressure control. 06/07/12 06/07/13  Curlene Labrum Readling, MD  irbesartan (AVAPRO) 150 MG tablet Take 1 tablet (150 mg total) by mouth daily. For blood pressure control. 06/07/12 06/07/13  Curlene Labrum Readling, MD  potassium chloride SA (K-DUR,KLOR-CON) 20 MEQ tablet Take 1 tablet (20 mEq total) by mouth daily with breakfast. For hypokalemia. 06/07/12 06/07/13  Curlene Labrum Readling, MD  risperiDONE (RISPERDAL) 0.25 MG tablet Take 0.25 mg by mouth 2 (two) times daily.    Historical Provider, MD  sertraline (ZOLOFT) 100 MG tablet Take 1 tablet (100 mg total) by mouth daily. For anxiety and depression. 09/23/12   Verne Spurr, PA-C  traZODone (DESYREL) 100 MG tablet Take 1 tablet (100 mg total) by mouth at bedtime. 09/23/12   Verne Spurr, PA-C  Valproic Acid (DEPAKENE) 250 MG/5ML SYRP syrup Take 1,500 mg by mouth at bedtime.     Historical Provider, MD   Physical Exam: Blood pressure 122/74, pulse 84, temperature 98.5 F (36.9 C), temperature source Oral, resp. rate 18, height 5\' 1"  (1.549 m), weight 42.185 kg (93 lb), SpO2 100.00%. Filed Vitals:   03/04/13 0834 03/04/13 0835 03/05/13 0742 03/05/13 0743  BP: 120/77 130/79 124/65 122/74  Pulse: 73 91 71 84  Temp: 98 F (36.7 C)  98.5 F (36.9 C)   TempSrc:  Oral  Oral   Resp: 18  18   Height:      Weight:      SpO2:        Constitutional: Vital signs reviewed.  Patient is a well-developed and poorly -nourished  in no acute distress and cooperative with exam. Alert and oriented to person and place. Head: Normocephalic and atraumatic Mouth: no erythema or exudates, MMM Eyes: PERRL, EOMI, conjunctivae normal, No scleral icterus.  Neck: Supple, Trachea  midline normal ROM, No JVD, mass, thyromegaly, or carotid bruit present.  Cardiovascular: RRR, S1 normal, S2 normal, no MRG, pulses symmetric and intact bilaterally Pulmonary/Chest: CTAB, no wheezes, rales, or rhonchi Abdominal: Soft. Non-tender, non-distended, bowel sounds are normal, no masses, organomegaly, or guarding present.  Musculoskeletal: No joint deformities, erythema, or stiffness, ROM full and no nontender Hematology: no cervical, inginal, or axillary adenopathy.  Neurological: Strength is normal and symmetric bilaterally, , no focal motor deficit, sensory intact to light touch bilaterally.  Skin: Warm, dry and intact. No rash, cyanosis, or clubbing.      Labs on Admission:  Basic Metabolic Panel:  Recent Labs Lab 03/03/13 1200  NA 141  K 3.6  CL 105  CO2 27  GLUCOSE 118*  BUN 6  CREATININE 0.75  CALCIUM 9.3   Liver Function Tests:  Recent Labs Lab 03/03/13 1200  AST 12  ALT 7  ALKPHOS 64  BILITOT 0.3  PROT 6.5  ALBUMIN 3.1*   No results found for this basename: LIPASE, AMYLASE,  in the last 168 hours No results found for this basename: AMMONIA,  in the last 168 hours CBC:  Recent Labs Lab 03/03/13 1200  WBC 5.9  HGB 9.7*  HCT 28.8*  MCV 80.4  PLT 202   Cardiac Enzymes: No results found for this basename: CKTOTAL, CKMB, CKMBINDEX, TROPONINI,  in the last 168 hours BNP: No components found with this basename: POCBNP,  CBG: No results found for this basename: GLUCAP,  in the last 168 hours  Radiological Exams on Admission: No results found.  EKG: pending.     [facility] Pager [phone]  If 7PM-7AM, please contact night-coverage [email or web address] Password [facility] 03/05/2013, 7:41 PM

## 2013-03-05 NOTE — Progress Notes (Signed)
Patient ID: Kaylee Kelley, female   DOB: [birth year], 67 y.o.   MRN: [id number] Called with request to change pt depakote order back to liqiud from sprinkles as pt tolerated liquid at full dose

## 2013-03-05 NOTE — [facility] Group Notes (Addendum)
[facility] Group Notes: (Clinical Social Work)   03/05/2013      Type of Therapy:  Group Therapy   Participation Level:  Did Not Attend    Ambrose Mantle, LCSW 03/05/2013, 4:49 PM

## 2013-03-06 DIAGNOSIS — D649 Anemia, unspecified: Secondary | ICD-10-CM

## 2013-03-06 DIAGNOSIS — I10 Essential (primary) hypertension: Secondary | ICD-10-CM

## 2013-03-06 DIAGNOSIS — F200 Paranoid schizophrenia: Secondary | ICD-10-CM

## 2013-03-06 MED ORDER — VALPROIC ACID 250 MG/5ML PO SYRP
1000.0000 mg | ORAL_SOLUTION | Freq: Every day | ORAL | Status: DC
  Administered 2013-03-06 – 2013-03-07 (×2): 1000 mg via ORAL
  Administered 2013-03-08: 500 mg via ORAL
  Filled 2013-03-06 (×4): qty 20

## 2013-03-06 MED ORDER — TRAZODONE HCL 100 MG PO TABS
100.0000 mg | ORAL_TABLET | Freq: Every evening | ORAL | Status: DC | PRN
  Administered 2013-03-13 – 2013-03-15 (×3): 100 mg via ORAL
  Filled 2013-03-06: qty 1
  Filled 2013-03-06: qty 3
  Filled 2013-03-06 (×3): qty 1

## 2013-03-06 NOTE — Tx Team (Signed)
  Interdisciplinary Treatment Plan Update   Date Reviewed:  03/06/2013  Time Reviewed:  8:14 AM  Progress in Treatment:   Attending groups: Yes Participating in groups: Yes Taking medication as prescribed: Yes  Tolerating medication: Yes Family/Significant other contact made: Yes  ACT team worker Patient understands diagnosis: No  Limited insight Discussing patient identified problems/goals with staff: Yes  See initial plan Medical problems stabilized or resolved: Yes Denies suicidal/homicidal ideation: Yes  In tx team Patient has not harmed self or others: Yes  For review of initial/current patient goals, please see plan of care.  Estimated Length of Stay:  4-5 days  Reason for Continuation of Hospitalization: Hallucinations Medication stabilization  New Problems/Goals identified:  N/A  Discharge Plan or Barriers:   return home, follow up with ACT team  Additional Comments:  Writer contacted patient's niece, Avon Gully [phone] for collateral information. [name] is also patient's caregiver but does not have legal POA. She has been living with her niece and her niece's children for the past 16 months. Prior to living with her neice patient was living in [place] with a sister. Per [name], patient has not been taking her medications. Says that the last time patient took her medications was the day before yesterday. Patient is also not eating well and easily irritated. Her behaviors and willingness to independently complete ADL's also fluctuates. Patient is also isolating herself from others and refusing to go outside the home. Her niece sts, "She will have several good weeks and without reason will decline by refusing to take meds, eat, sleep, etc. Her niece is not sure what triggers patient's moods or behaviors, however; feels that having a household of people mainly teenagers could be an issue. Patient's niece denies that patient has made comments regarding SI or HI. Says that she has  only noticed anger, irritability, and frustration. No physical outburst only verbal. Says that patient has lately been using a lot of profanity.  Patient has a history of auditory hallucinations, per her niece. Patient apparently tells family that the voices are always talking to her and will not stop. [name] is unclear if voices are command type.   Attendees:  Signature: Thedore Mins, MD 03/06/2013 8:14 AM   Signature: Richelle Ito, LCSW 03/06/2013 8:14 AM  Signature: Verne Spurr, PA 03/06/2013 8:14 AM  Signature: Joslyn Devon, RN 03/06/2013 8:14 AM  Signature: Liborio Nixon, RN 03/06/2013 8:14 AM  Signature:  03/06/2013 8:14 AM  Signature:   03/06/2013 8:14 AM  Signature:    Signature:    Signature:    Signature:    Signature:    Signature:      Scribe for Treatment Team:   Richelle Ito, LCSW  03/06/2013 8:14 AM

## 2013-03-06 NOTE — [facility] Group Notes (Signed)
[facility] LCSW Aftercare Discharge Planning Group Note   03/06/2013 8:21 AM  Participation Quality:  Did not attend    [place]

## 2013-03-06 NOTE — Progress Notes (Signed)
[facility] PROGRESS NOTE  Kaylee Kelley ZOX:[id number] DOB: [birth year] DOA: 03/03/2013 PCP: No primary provider on file.  Assessment/Plan: 1. Anemia:normocytic anemia. un reliable history from the patient.  Stool for occult blood is negativ. weight loss of more then 30 lbs in 6 months: most likely secondary to psychosis leading to poor po intake/ possibly intentional. Pro albumin, TSH are pending. She will need a mammogram, baseline colonoscopy as outpatient. No palpable lymphadenopathy. Nutritionist consult.  2. Schizophrenia/ Psychosis: further management as per psychiatry 3.  Hypertension: well controlled. Resume home medications.  PT/OT eval.  Code Status: full code Disposition Plan: pending.     HPI/Subjective: Pt refusing to talk to  Me, refusing to be examined, refusing blood draws at this time.   Objective: Filed Vitals:   03/05/13 0742 03/05/13 0743 03/06/13 0831 03/06/13 0832  BP: 124/65 122/74 106/67 108/70  Pulse: 71 84 73 84  Temp: 98.5 F (36.9 C)  97 F (36.1 C)   TempSrc: Oral  Oral   Resp: 18  18   Height:      Weight:      SpO2:       No intake or output data in the 24 hours ending 03/06/13 1911 Filed Weights   03/03/13 2220  Weight: 42.185 kg (93 lb)    Exam:  Refused to be examined. But appears to be comfortable and not in any pain.  Data Reviewed: Basic Metabolic Panel:  Recent Labs Lab 03/03/13 1200  NA 141  K 3.6  CL 105  CO2 27  GLUCOSE 118*  BUN 6  CREATININE 0.75  CALCIUM 9.3   Liver Function Tests:  Recent Labs Lab 03/03/13 1200  AST 12  ALT 7  ALKPHOS 64  BILITOT 0.3  PROT 6.5  ALBUMIN 3.1*   No results found for this basename: LIPASE, AMYLASE,  in the last 168 hours No results found for this basename: AMMONIA,  in the last 168 hours CBC:  Recent Labs Lab 03/03/13 1200  WBC 5.9  HGB 9.7*  HCT 28.8*  MCV 80.4  PLT 202   Cardiac Enzymes: No results found for this basename: CKTOTAL, CKMB, CKMBINDEX,  TROPONINI,  in the last 168 hours BNP (last 3 results) No results found for this basename: PROBNP,  in the last 8760 hours CBG: No results found for this basename: GLUCAP,  in the last 168 hours  Recent Results (from the past 240 hour(s))  URINE CULTURE     Status: None   Collection Time    03/03/13  8:24 PM      Result Value Range Status   Specimen Description URINE, CLEAN CATCH   Final   Special Requests NONE   Final   Culture  Setup Time 03/04/2013 01:33   Final   Colony Count 60,000 COLONIES/ML   Final   Culture     Final   Value: Multiple bacterial morphotypes present, none predominant. Suggest appropriate recollection if clinically indicated.   Report Status 03/05/2013 FINAL   Final     Studies: No results found.  Scheduled Meds: . fluticasone  2 spray Each Nare Daily  . hydrochlorothiazide  25 mg Oral Daily  . irbesartan  150 mg Oral Daily  . potassium chloride  20 mEq Oral Daily  . risperiDONE  2 mg Oral QHS  . sertraline  100 mg Oral Daily  . Valproic Acid  1,000 mg Oral QHS  . ziprasidone  10 mg Intramuscular Once   Continuous Infusions:   Active  Problems:   Anemia        Kaylee Kelley  [facility] Pager [phone]. If 7PM-7AM, please contact night-coverage at [email or web address], password [facility] 03/06/2013, 7:11 PM  LOS: 3 days

## 2013-03-06 NOTE — Progress Notes (Signed)
Patient ID: Kaylee Kelley, female   DOB: 1946/07/03, 67 y.o.   MRN: [id number]  Pt. Refuses to have labs drawn, writer rescheduled for am.

## 2013-03-06 NOTE — [facility] Counselor (Signed)
Adult Psychosocial Assessment Update Interdisciplinary Team  Previous [facility] admissions/discharges:  Admissions Discharges  Date: current Date:  Date: 09/18/13 Date:  Date: 05/21/13 Date:  Date: Date:  Date: Date:   Changes since the last Psychosocial Assessment (including adherence to outpatient mental health and/or substance abuse treatment, situational issues contributing to decompensation and/or relapse). Still living with niece.  Now with ACT team.  Doing well for the most part, but recently stopped taking meds.  Isolated in room, not doing ADL's, cursing.             Discharge Plan 1. Will you be returning to the same living situation after discharge?   Yes:X No:      If no, what is your plan?           2. Would you like a referral for services when you are discharged? Yes:     If yes, for what services?  No:       Continue with ACT team       Summary and Recommendations (to be completed by the evaluator) Kaylee Kelley can benefit from crises stabilization, medication management, therapeutic mileiu and coordiniation of services with family and ACT team.                       Signature:  Ida Rogue, 03/06/2013 5:05 PM

## 2013-03-06 NOTE — Progress Notes (Signed)
Patient ID: Kaylee Kelley, female   DOB: 1946/10/05, 67 y.o.   MRN: [id number] Patient has been nonresponsive to staff this am.  She refused his medications this am, however took them around lunch time.  She did refuse her potassium syrup; will talk to pharmacy because patient might do better with pill form.  Patient has been lying in bed with the covers over her head.  She speaks rapidly to the point of being incoherent.  When leaving the room, patient will shout out "close the door!"  She remains isolative to her room and denies SI/HI.  She appears to have internal stimuli.  Her niece called to ask about her, however, she does not have the code number for the patient.  Continue to monitor medication management and MD orders. Safety checks completed every 15 minutes per protocol.  Patient behavior has been appropriate; she is redirectable.

## 2013-03-06 NOTE — Progress Notes (Signed)
Patient ID: Kaylee Kelley, female   DOB: 1946/06/13, 67 y.o.   MRN: [id number] [facility] MD Progress Note  03/06/2013 11:04 AM Kaylee Kelley  MRN:  [id number] Subjective:  "I am very sleepy, tired"  Objective: Patient remains in bed all day and night, refusing to answer questions. She is asking to be left alone as she is too tired and sleepy to have conversation. Patient has not been participating in unit programming but she is not agitated or disruptive either. She has not  been accepting her medications. Diagnosis:  Schizophrenia paranoid type, acute exacerbation                      Medical non compliant                      Anemia                      Weight loss  ADL's:  Impaired  Sleep: Fair Appetite:  Good  Suicidal Ideation: unable to determine  Homicidal Ideation: unable to determine  AEB (as evidenced by):  Psychiatric Specialty Exam: Review of Systems  Unable to perform ROS: patient nonverbal    Blood pressure 108/70, pulse 84, temperature 97 F (36.1 C), temperature source Oral, resp. rate 18, height 5\' 1"  (1.549 m), weight 42.185 kg (93 lb), SpO2 100.00%.Body mass index is 17.58 kg/(m^2).  General Appearance: Disheveled  Eye Contact::  Poor  Speech:  Garbled  Volume:  Normal  Mood:  Irritable  Affect:  Labile  Thought Process:  Disorganized  Orientation:  NA  Thought Content:  NA  Suicidal Thoughts:  No  Homicidal Thoughts:  No  Memory:  NA  Judgement:  Impaired  Insight:  Lacking  Psychomotor Activity:  Normal  Concentration:  poor  Recall:  NA  Akathisia:  No  Handed:  Right  AIMS (if indicated):     Assets:  Financial Resources/Insurance Housing Social Support  Sleep:  Number of Hours: 6   Current Medications: Current Facility-Administered Medications  Medication Dose Route Frequency Provider Last Rate Last Dose  . acetaminophen (TYLENOL) tablet 650 mg  650 mg Oral Q6H PRN Shuvon Rankin, NP      . alum & mag hydroxide-simeth (MAALOX/MYLANTA) 200-200-20  MG/5ML suspension 30 mL  30 mL Oral Q4H PRN Shuvon Rankin, NP      . fluticasone (FLONASE) 50 MCG/ACT nasal spray 2 spray  2 spray Each Nare Daily Shuvon Rankin, NP      . hydrochlorothiazide (HYDRODIURIL) tablet 25 mg  25 mg Oral Daily Shuvon Rankin, NP   25 mg at 03/05/13 0900  . irbesartan (AVAPRO) tablet 150 mg  150 mg Oral Daily Shuvon Rankin, NP   150 mg at 03/05/13 0900  . LORazepam (ATIVAN) tablet 1 mg  1 mg Oral Q8H PRN Shuvon Rankin, NP   1 mg at 03/05/13 2145  . magnesium hydroxide (MILK OF MAGNESIA) suspension 30 mL  30 mL Oral Daily PRN Shuvon Rankin, NP      . potassium chloride 20 MEQ/15ML (10%) liquid 20 mEq  20 mEq Oral Daily Johnell Bas   20 mEq at 03/05/13 2302  . risperiDONE (RISPERDAL) tablet 2 mg  2 mg Oral QHS Verne Spurr, PA-C   2 mg at 03/05/13 2145  . sertraline (ZOLOFT) tablet 100 mg  100 mg Oral Daily Shuvon Rankin, NP   100 mg at 03/05/13 0901  . traZODone (DESYREL) tablet 100 mg  100 mg Oral QHS PRN Selwyn Reason      . Valproic Acid (DEPAKENE) 250 MG/5ML syrup SYRP 1,000 mg  1,000 mg Oral QHS Jennae Hakeem      . ziprasidone (GEODON) injection 10 mg  10 mg Intramuscular Once Rachael Fee, MD        Lab Results:  Results for orders placed during the hospital encounter of 03/03/13 (from the past 48 hour(s))  FERRITIN     Status: None   Collection Time    03/05/13  6:30 AM      Result Value Range   Ferritin 126  10 - 291 ng/mL  IRON AND TIBC     Status: Abnormal   Collection Time    03/05/13  6:30 AM      Result Value Range   Iron 33 (*) 42 - 135 ug/dL   TIBC 161 (*) 096 - 045 ug/dL   Saturation Ratios 14 (*) 20 - 55 %   UIBC 205  125 - 400 ug/dL  TSH     Status: None   Collection Time    03/05/13  6:30 AM      Result Value Range   TSH 1.405  0.350 - 4.500 uIU/mL  VALPROIC ACID LEVEL     Status: Abnormal   Collection Time    03/05/13  6:30 AM      Result Value Range   Valproic Acid Lvl 28.4 (*) 50.0 - 100.0 ug/mL  OCCULT BLOOD X 1 CARD TO  LAB, STOOL     Status: None   Collection Time    03/05/13  3:14 PM      Result Value Range   Fecal Occult Bld NEGATIVE  NEGATIVE    Physical Findings: AIMS: Facial and Oral Movements Muscles of Facial Expression: None, normal Lips and Perioral Area: None, normal Jaw: None, normal Tongue: None, normal,Extremity Movements Upper (arms, wrists, hands, fingers): None, normal Lower (legs, knees, ankles, toes): None, normal, Trunk Movements Neck, shoulders, hips: None, normal, Overall Severity Severity of abnormal movements (highest score from questions above): None, normal Incapacitation due to abnormal movements: None, normal Patient's awareness of abnormal movements (rate only patient's report): No Awareness, Dental Status Current problems with teeth and/or dentures?: No Does patient usually wear dentures?: Yes  CIWA:    COWS:     Treatment Plan Summary: Daily contact with patient to assess and evaluate symptoms and progress in treatment Medication management  Plan: 1. Continue crisis management and stabilization. 2. Medication management to reduce current symptoms to base line and improve patient's overall level of functioning 3. Treat health problems as indicated. 4. Develop treatment plan to decrease risk of relapse upon discharge and the need for readmission. 5. Psycho-social education regarding relapse prevention and self care. 6. Health care follow up as needed for medical problems. 7. Consider the possible need for a higher level of care given her multiple admissions, weight loss, and anemia. New items:  8. Risperdal decreased to 1mg  at bedtime due to sedation.  9. Depakote also decreased to 1000mg  at bedtime due to sedation 10. Awaiting  Dr. Blake Divine to evaluate this patient for anemia and weight loss. 11.ELOS: 5-7 days Medical Decision Making Problem Points:  Established problem, stable/improving (1) and New problem, with additional work-up planned (4) Data Points:   Review or order medicine tests (1) Review and summation of old records (2) Review of new medications or change in dosage (2)  I certify that inpatient services furnished can reasonably be  expected to improve the patient's condition.  Thedore Mins, MD 11:04 AM 03/06/2013

## 2013-03-06 NOTE — Progress Notes (Signed)
Patient ID: Kaylee Kelley, female   DOB: [birth year], 67 y.o.   MRN: [id number] D: Pt. Lying in bed, reports she came back to hospital because "I was vomiting."  Pt. Reports voices "sometimes" but denies SHI. Pt. Has her tissues lying on floor at bedside on blanket some appears to have been used for spitting.  Pt. Counts back the days to tell me when she was admitted. Pt. Logical during this encounter. A: Writer introduced self to client and reviewed med administration time. Pt. Will be monitored q2min for safety. Pt. Encouraged to attend group. R: Pt. Is safe on the unit. Pt. Did not attend group.

## 2013-03-06 NOTE — Progress Notes (Signed)
Recreation Therapy Notes  Date: 05.19.2014 Time: 9:30am Location: [address] Day Room      Group Topic/Focus: Memory & Leisure Education  Participation Level: Did not attend  Jearl Klinefelter, LRT/CTRS  Jearl Klinefelter 03/06/2013 10:04 AM

## 2013-03-06 NOTE — Clinical Social Work Note (Signed)
  Type of Therapy: Process Group Therapy  Participation Level:  Did Not Attend      Summary of Progress/Problems: Today's group addressed the issue of overcoming obstacles.  Patients were asked to identify their biggest obstacle post d/c that stands in the way of their on-going success, and then problem solve as to how to manage this.       Daryel Gerald B 03/06/2013   4:20 PM

## 2013-03-06 NOTE — Care Management Utilization Note (Signed)
   Per State Regulation 482.30  This chart was reviewed for necessity with respect to the patient's Admission/ Duration of stay.  Next review date: 03/09/13  Nicolasa Ducking RN, BSN

## 2013-03-07 MED ORDER — BOOST / RESOURCE BREEZE PO LIQD
1.0000 | Freq: Three times a day (TID) | ORAL | Status: DC
  Administered 2013-03-08 – 2013-03-13 (×12): 1 via ORAL
  Filled 2013-03-07 (×23): qty 1

## 2013-03-07 NOTE — Progress Notes (Signed)
Patient ID: Kaylee Kelley, female   DOB: 1946/10/07, 67 y.o.   MRN: [id number] Pt. Refuse labs again this morning, even with much encouragement by staff and Clinical research associate. "I'm not going to take no more blood draw"

## 2013-03-07 NOTE — Clinical Social Work Note (Signed)
[facility] LCSW Group Therapy  03/07/2013 , 10:20 AM   Type of Therapy:  Group Therapy  Participation Level:  Did not attend    Summary of Progress/Problems: Today's group focused on the term Diagnosis.  Participants were asked to define the term, and then pronounce whether it is a negative, positive or neutral term.  Daryel Gerald B 03/07/2013 , 10:20 AM

## 2013-03-07 NOTE — Progress Notes (Signed)
Psychoeducational Group Note  Date:  03/07/2013 Time:  2015  Group Topic/Focus:  Wrap-Up Group:   The focus of this group is to help patients review their daily goal of treatment and discuss progress on daily workbooks.  Participation Level: Did Not Attend  Participation Quality:  Not Applicable  Affect:  Not Applicable  Cognitive:  Not Applicable  Insight:  Not Applicable  Engagement in Group: Not Applicable  Additional Comments:    Maretta Los 03/07/2013, 10:35 PM

## 2013-03-07 NOTE — Progress Notes (Signed)
Patient ID: Kaylee Kelley, female   DOB: [birth year], 67 y.o.   MRN: [id number] [facility] MD Progress Note  03/07/2013 2:07 PM Marlys Stegmaier  MRN:  [id number] Subjective: Patient does not respond. Objective: Patient is in bed, sits up, and pulls the covers over her head like a Burka. Does not respond to any questions or comments. She is not screaming or howeling as she was over the weekend.   Diagnosis:  Schizophrenia paranoid type, acute exacerbation                      Medical non compliant                      Anemia                      Weight loss  ADL's:  Impaired  Sleep:  Improved 5.25 reported Appetite:  Good  Suicidal Ideation:  UTA Homicidal Ideation:  UTA AEB (as evidenced by): patient's report, observation.  Psychiatric Specialty Exam: Review of Systems  Unable to perform ROS   Blood pressure 120/80, pulse 101, temperature 98.5 F (36.9 C), temperature source Oral, resp. rate 16, height 5\' 1"  (1.549 m), weight 42.185 kg (93 lb), SpO2 100.00%.Body mass index is 17.58 kg/(m^2).  General Appearance: Disheveled  Eye Contact::  none  Speech:  none  Volume:  none  Mood:  uta  Affect:  uta  Thought Process:  Disorganized  Orientation:  NA  Thought Content:  NA  Suicidal Thoughts:  No  Homicidal Thoughts:  No  Memory:  NA  Judgement:  Impaired  Insight:  Lacking  Psychomotor Activity:  Normal  Concentration:  poor  Recall:  NA  Akathisia:  No  Handed:  Right  AIMS (if indicated):     Assets:  Financial Resources/Insurance Housing Social Support  Sleep:  Number of Hours: 5.75   Current Medications: Current Facility-Administered Medications  Medication Dose Route Frequency Provider Last Rate Last Dose  . acetaminophen (TYLENOL) tablet 650 mg  650 mg Oral Q6H PRN Shuvon Rankin, NP      . alum & mag hydroxide-simeth (MAALOX/MYLANTA) 200-200-20 MG/5ML suspension 30 mL  30 mL Oral Q4H PRN Shuvon Rankin, NP      . fluticasone (FLONASE) 50 MCG/ACT nasal spray 2 spray  2  spray Each Nare Daily Shuvon Rankin, NP      . hydrochlorothiazide (HYDRODIURIL) tablet 25 mg  25 mg Oral Daily Shuvon Rankin, NP   25 mg at 03/07/13 0818  . irbesartan (AVAPRO) tablet 150 mg  150 mg Oral Daily Shuvon Rankin, NP   150 mg at 03/07/13 0818  . LORazepam (ATIVAN) tablet 1 mg  1 mg Oral Q8H PRN Shuvon Rankin, NP   1 mg at 03/05/13 2145  . magnesium hydroxide (MILK OF MAGNESIA) suspension 30 mL  30 mL Oral Daily PRN Shuvon Rankin, NP      . potassium chloride 20 MEQ/15ML (10%) liquid 20 mEq  20 mEq Oral Daily Mojeed Akintayo   20 mEq at 03/05/13 2302  . risperiDONE (RISPERDAL) tablet 2 mg  2 mg Oral QHS Verne Spurr, PA-C   2 mg at 03/06/13 2144  . sertraline (ZOLOFT) tablet 100 mg  100 mg Oral Daily Shuvon Rankin, NP   100 mg at 03/07/13 0818  . traZODone (DESYREL) tablet 100 mg  100 mg Oral QHS PRN Mojeed Akintayo      . Valproic Acid (DEPAKENE) 250 MG/5ML syrup  SYRP 1,000 mg  1,000 mg Oral QHS Mojeed Akintayo   1,000 mg at 03/06/13 2144    Lab Results:  Results for orders placed during the hospital encounter of 03/03/13 (from the past 48 hour(s))  OCCULT BLOOD X 1 CARD TO LAB, STOOL     Status: None   Collection Time    03/05/13  3:14 PM      Result Value Range   Fecal Occult Bld NEGATIVE  NEGATIVE    Physical Findings: AIMS: Facial and Oral Movements Muscles of Facial Expression: None, normal Lips and Perioral Area: None, normal Jaw: None, normal Tongue: None, normal,Extremity Movements Upper (arms, wrists, hands, fingers): None, normal Lower (legs, knees, ankles, toes): None, normal, Trunk Movements Neck, shoulders, hips: None, normal, Overall Severity Severity of abnormal movements (highest score from questions above): None, normal Incapacitation due to abnormal movements: None, normal Patient's awareness of abnormal movements (rate only patient's report): No Awareness, Dental Status Current problems with teeth and/or dentures?: No Does patient usually wear  dentures?: Yes  CIWA:    COWS:     Treatment Plan Summary: Daily contact with patient to assess and evaluate symptoms and progress in treatment Medication management  Plan: 1. Continue crisis management and stabilization. 2. Medication management to reduce current symptoms to base line and improve patient's overall level of functioning 3. Treat health problems as indicated. 4. Develop treatment plan to decrease risk of relapse upon discharge and the need for readmission. 5. Psycho-social education regarding relapse prevention and self care. 6. Health care follow up as needed for medical problems. 7. Consider the possible need for a higher level of care given her multiple admissions, weight loss, and anemia. New items:  8. Continue current medications as written.  9. IM will follow for anemia and weight loss.  10.ELOS: 5-7 days  11. Stool for occult blood is negative. Medical Decision Making Problem Points:  Established problem, stable/improving (1) and New problem, with additional work-up planned (4) Data Points:  Review or order medicine tests (1) Review and summation of old records (2) Review of new medications or change in dosage (2)  I certify that inpatient services furnished can reasonably be expected to improve the patient's condition.  Rona Ravens. Duffy Dantonio RPAC 2:07 PM 03/07/2013

## 2013-03-07 NOTE — Progress Notes (Signed)
Patient ID: Kaylee Kelley, female   DOB: 1946-05-24, 66 y.o.   MRN: [id number]  Pt decided to take medication later.

## 2013-03-07 NOTE — Progress Notes (Signed)
D: Pt denied SI this morning during medication administration @ 0800. Pt compliant with meds and requested to take liquid potassium later today. Pt presented with brief eye contact, rapid speech and flat affect. Pt denied pain and show no s/s of distress. Writer approached pt at 1100 to reassess pt mental status. Pt refused to speak to writer, pt turned her head in the opposite direction and continued to avoid eye contact during the assessment. Pt was sitting on her bed, picking at her breakfast tray from this morning. Pt has poor appetite.  A: Medications administered as ordered per MD. Verbal support given. Shift assessment completed. Assistants provided to pt as needed to assist with ADL and meals. 15 minute checks performed for safety. R: Pt remains safe at this time. Pt has minimal interaction on the milieu, pt do not attend groups, pt isolates in her room.

## 2013-03-07 NOTE — ED Provider Notes (Signed)
Medical screening examination/treatment/procedure(s) were performed by non-physician practitioner and as supervising physician I was immediately available for consultation/collaboration.  Jamilet Ambroise T Seairra Otani, MD 03/07/13 2307 

## 2013-03-07 NOTE — Progress Notes (Signed)
NUTRITION ASSESSMENT  Patient meets criteria for severe malnutrition related to chronic illness vs social AEB 40% weight loss in the last 9 months (24% in the last 5 months), decreased muscle mass and body fat.  Pt identified as at risk on the Malnutrition Screen Tool  INTERVENTION: 1. Educated patient on the importance of nutrition and encouraged intake of food and beverages. 2. Discussed weight goals. 3. Supplements: Resource Breeze po TID, each supplement provides 250 kcal and 9 grams of protein. 4.  Milk with meals.  NUTRITION DIAGNOSIS: Unintentional weight loss related to sub-optimal intake as evidenced by pt report.   Goal: Pt to meet >/= 90% of their estimated nutrition needs.  Monitor:  PO intake  Assessment:  Patient admitted with Schizophrenia and anemia.  Patient is edentulous and difficult to understand.  She states that is is able to tolerate all food consistencies and has been telling staff what to bring her.  Patient states that appetite is "not too bad" and reports good intake currently.  Unable to obtain diet hx from patient.  Patient has lost 40% of her weight in the last 9 months.     67 y.o. female  Height: Ht Readings from Last 1 Encounters:  03/03/13 5\' 1"  (1.549 m)    Weight: Wt Readings from Last 1 Encounters:  03/03/13 93 lb (42.185 kg)    Weight Hx: Wt Readings from Last 10 Encounters:  03/03/13 93 lb (42.185 kg)  09/18/12 122 lb (55.339 kg)  05/21/12 154 lb (69.854 kg)    BMI:  Body mass index is 17.58 kg/(m^2). Pt meets criteria for underweight based on current BMI.  Estimated Nutritional Needs: Kcal: 25-30 kcal/kg Protein: > 1 gram protein/kg Fluid: 1 ml/kcal  Diet Order: General Pt is also offered choice of unit snacks mid-morning and mid-afternoon.  Pt is eating as desired.   Lab results and medications reviewed.   Oran Rein, RD, LDN Clinical Inpatient Dietitian Pager:  [phone] Weekend and after hours pager:   [phone]

## 2013-03-07 NOTE — Progress Notes (Signed)
Pt refused to allow writer to perform EKG. Writer attempted two times along with another RN present. Pt was screaming, "LEAVE ME ALONE, DON'T TOUCH ME", and then began crying out. As Clinical research associate walked towards pt to reassure her, pt began swinging her arms towards Clinical research associate.

## 2013-03-07 NOTE — Progress Notes (Signed)
Patient ID: Kaylee Kelley, female   DOB: 01/07/1946, 67 y.o.   MRN: [id number]  D: Pt is rude, argumentative, does not want to talk or take medications. " I don't want to talk to you, leave me alone, GET OUT". " Get out my room, LEAVE ME ALONE"  A:   . Q 15 minute checks were done for safety. Tried to talk to pt, pt did not respond.  R:Pt does not attend groups and does not  interact well with peers and staff. Pt is not taking medication. Pt is not receptive to treatment and safety maintained on unit.

## 2013-03-07 NOTE — Progress Notes (Signed)
Patient ID: Kaylee Kelley, female   DOB: October 10, 1946, 67 y.o.   MRN: [id number]  Pt had an unauthorized Female visitor after visiting hrs, claiming to be part of her ACT team, visitor was escorted off the unit without giving information, visitor was not on the pt list of people to give information.

## 2013-03-07 NOTE — [facility] Group Notes (Signed)
[facility] LCSW Aftercare Discharge Planning Group Note   03/07/2013 10:19 AM  Participation Quality:  Did not attend    [place]

## 2013-03-07 NOTE — Progress Notes (Signed)
PT Cancellation Note  Patient Details Name: Kaylee Kelley MRN: [id number] DOB: 12/12/1945   Cancelled Treatment:    Reason Eval/Treat Not Completed: Other (comment) (Per RN pt is independent with mobility. Pt refused PT eval. ) Staff stated they've observed pt walking independently in halls without loss of balance or other deviations. Pt denied pain. PT signing off due to no PT needs at this time.   Ralene Bathe Kistler 03/07/2013, 1:18 PM [phone]

## 2013-03-08 MED ORDER — FERROUS GLUCONATE 324 (38 FE) MG PO TABS: 324.0000 mg | ORAL_TABLET | Freq: Every day | ORAL | Status: DC

## 2013-03-08 MED ORDER — POTASSIUM CHLORIDE 20 MEQ/15ML (10%) PO LIQD
20.0000 meq | Freq: Every day | ORAL | Status: DC
  Filled 2013-03-08 (×2): qty 15

## 2013-03-08 MED ORDER — FERROUS SULFATE 325 (65 FE) MG PO TABS
325.0000 mg | ORAL_TABLET | Freq: Every day | ORAL | Status: DC
  Administered 2013-03-09 – 2013-03-21 (×10): 325 mg via ORAL
  Filled 2013-03-08 (×2): qty 1
  Filled 2013-03-08: qty 3
  Filled 2013-03-08 (×13): qty 1

## 2013-03-08 NOTE — Progress Notes (Signed)
Psychoeducational Group Note  Date:  03/08/2013 Time:  1100  Group Topic/Focus:  Personal Choices and Values:   The focus of this group is to help patients assess and explore the importance of values in their lives, how their values affect their decisions, how they express their values and what opposes their expression.  Participation Level: Did Not Attend  Participation Quality:  Not Applicable  Affect:  Not Applicable  Cognitive:  Not Applicable  Insight:  Not Applicable  Engagement in Group: Not Applicable  Additional Comments:  Pt remained in room responding to internal stimuli.   Sharyn Lull 03/08/2013, 5:24 PM

## 2013-03-08 NOTE — Progress Notes (Signed)
Psychoeducational Group Note  Date:  03/08/2013 Time:  2000  Group Topic/Focus:  Wrap-Up Group:   The focus of this group is to help patients review their daily goal of treatment and discuss progress on daily workbooks.  Participation Level: Did Not Attend  Participation Quality:  Not Applicable  Affect:  Not Applicable  Cognitive:  Not Applicable  Insight:  Not Applicable  Engagement in Group: Not Applicable  Additional Comments:  Pt did not attend.  Christ Kick 03/08/2013, 10:24 PM

## 2013-03-08 NOTE — Progress Notes (Signed)
[facility] PROGRESS NOTE  Kaylee Kelley RUE:[id number] DOB: 03-Dec-1945 DOA: 03/03/2013 PCP: No primary provider on file.  Assessment/Plan: 1. Anemia:normocytic anemia. un reliable history from the patient.  Stool for occult blood is negative. Iron saturation is 14%, will start her on fergon 324 mg po daily weight loss of more then 30 lbs in 6 months: most likely secondary to psychosis leading to poor po intake/ possibly intentional. Pre albumin not done as patient refused labs.TSH is normal. She will need a mammogram, baseline colonoscopy as outpatient. No palpable lymphadenopathy.   2. Schizophrenia/ Psychosis: further management as per psychiatry 3.  Hypertension: well controlled. Resume home medications.   We will sign off, call us again if needed  Code Status: full code Disposition Plan: pending.     HPI/Subjective: atient  very psychotic at this time, talking non stop. Unable to provide history. Objective: Filed Vitals:   03/07/13 0746 03/08/13 0757 03/08/13 0759 03/08/13 1206  BP: 120/80 83/44 105/62 125/78  Pulse: 101 82 90 90  Temp:  98.4 F (36.9 C) 98.4 F (36.9 C)   TempSrc:  Oral Oral   Resp:  12    Height:      Weight:      SpO2:       No intake or output data in the 24 hours ending 03/08/13 1352 Filed Weights   03/03/13 2220  Weight: 42.185 kg (93 lb)    Exam:  Heent; NCAt Chest: Clear bilaterally Heart: s1s2 RRR Abdomen: Soft, nontender  Data Reviewed: Basic Metabolic Panel:  Recent Labs Lab 03/03/13 1200  NA 141  K 3.6  CL 105  CO2 27  GLUCOSE 118*  BUN 6  CREATININE 0.75  CALCIUM 9.3   Liver Function Tests:  Recent Labs Lab 03/03/13 1200  AST 12  ALT 7  ALKPHOS 64  BILITOT 0.3  PROT 6.5  ALBUMIN 3.1*   No results found for this basename: LIPASE, AMYLASE,  in the last 168 hours No results found for this basename: AMMONIA,  in the last 168 hours CBC:  Recent Labs Lab 03/03/13 1200  WBC 5.9  HGB 9.7*  HCT 28.8*  MCV  80.4  PLT 202   Cardiac Enzymes: No results found for this basename: CKTOTAL, CKMB, CKMBINDEX, TROPONINI,  in the last 168 hours BNP (last 3 results) No results found for this basename: PROBNP,  in the last 8760 hours CBG: No results found for this basename: GLUCAP,  in the last 168 hours  Recent Results (from the past 240 hour(s))  URINE CULTURE     Status: None   Collection Time    03/03/13  8:24 PM      Result Value Range Status   Specimen Description URINE, CLEAN CATCH   Final   Special Requests NONE   Final   Culture  Setup Time 03/04/2013 01:33   Final   Colony Count 60,000 COLONIES/ML   Final   Culture     Final   Value: Multiple bacterial morphotypes present, none predominant. Suggest appropriate recollection if clinically indicated.   Report Status 03/05/2013 FINAL   Final     Studies: No results found.  Scheduled Meds: . feeding supplement  1 Container Oral TID BM  . fluticasone  2 spray Each Nare Daily  . hydrochlorothiazide  25 mg Oral Daily  . irbesartan  150 mg Oral Daily  . potassium chloride  20 mEq Oral Daily  . risperiDONE  2 mg Oral QHS  . sertraline  100  mg Oral Daily  . Valproic Acid  1,000 mg Oral QHS   Continuous Infusions:   Active Problems:   Anemia        [facility]  [facility] Pager [phone]. If 7PM-7AM, please contact night-coverage at [email or web address], password [facility] 03/08/2013, 1:52 PM  LOS: 5 days

## 2013-03-08 NOTE — Progress Notes (Signed)
[facility] MD Progress Note  03/08/2013 9:26 AM Kaylee Kelley  MRN:  [id number]  Subjective: "I am trying to survive" Objective: Patient reports that she did not sleep last night, however she slept all day yesterday. Her thought process remains disorganized, she is delusional, talking to herself and endorsing auditory hallucinations. She is less agitated and belligerent. She has been taking her medications with no adverse effects reported.  Diagnosis:  Chronic Paranoid Schizophrenia   ADL's:  Intact  Sleep: Fair  Appetite:  Poor  Suicidal Ideation:  Plan:  denied Intent:  denied Means:  denied Homicidal Ideation:  Plan:  denied Intent:  denied Means:  denied AEB (as evidenced by):  Psychiatric Specialty Exam: Review of Systems  Constitutional: Negative.   HENT: Negative.   Eyes: Negative.   Respiratory: Negative.   Cardiovascular: Negative.   Gastrointestinal: Negative.   Genitourinary: Negative.   Musculoskeletal: Positive for myalgias.  Skin: Negative.   Neurological: Negative.   Endo/Heme/Allergies: Negative.   Psychiatric/Behavioral: Positive for hallucinations.    Blood pressure 105/62, pulse 82, temperature 98.4 F (36.9 C), temperature source Oral, resp. rate 90, height 5\' 1"  (1.549 m), weight 42.185 kg (93 lb), SpO2 100.00%.Body mass index is 17.58 kg/(m^2).  General Appearance: Disheveled  Eye Contact::  Poor  Speech:  Garbled and Pressured  Volume:  Decreased  Mood:  Dysphoric  Affect:  Blunt  Thought Process:  Disorganized  Orientation:  Only to person  Thought Content:  Hallucinations: Auditory  Suicidal Thoughts:  No  Homicidal Thoughts:  No  Memory:  Immediate;   Poor Recent;   Poor Remote;   Fair  Judgement:  Poor  Insight:  Lacking  Psychomotor Activity:  Decreased  Concentration:  Poor  Recall:  Poor  Akathisia:  No  Handed:  Right  AIMS (if indicated):     Assets:  Desire for Improvement Social Support  Sleep:  Number of Hours: 0    Current Medications: Current Facility-Administered Medications  Medication Dose Route Frequency Provider Last Rate Last Dose  . acetaminophen (TYLENOL) tablet 650 mg  650 mg Oral Q6H PRN Shuvon Rankin, NP      . alum & mag hydroxide-simeth (MAALOX/MYLANTA) 200-200-20 MG/5ML suspension 30 mL  30 mL Oral Q4H PRN Shuvon Rankin, NP      . feeding supplement (RESOURCE BREEZE) liquid 1 Container  1 Container Oral TID BM Jeoffrey Massed, RD      . fluticasone (FLONASE) 50 MCG/ACT nasal spray 2 spray  2 spray Each Nare Daily Shuvon Rankin, NP      . hydrochlorothiazide (HYDRODIURIL) tablet 25 mg  25 mg Oral Daily Shuvon Rankin, NP   25 mg at 03/07/13 0818  . irbesartan (AVAPRO) tablet 150 mg  150 mg Oral Daily Shuvon Rankin, NP   150 mg at 03/08/13 0744  . LORazepam (ATIVAN) tablet 1 mg  1 mg Oral Q8H PRN Shuvon Rankin, NP   1 mg at 03/05/13 2145  . magnesium hydroxide (MILK OF MAGNESIA) suspension 30 mL  30 mL Oral Daily PRN Shuvon Rankin, NP      . potassium chloride 20 MEQ/15ML (10%) liquid 20 mEq  20 mEq Oral Daily Pierra Skora   20 mEq at 03/05/13 2302  . risperiDONE (RISPERDAL) tablet 2 mg  2 mg Oral QHS Verne Spurr, PA-C   2 mg at 03/07/13 2231  . sertraline (ZOLOFT) tablet 100 mg  100 mg Oral Daily Shuvon Rankin, NP   100 mg at 03/08/13 0744  . traZODone (DESYREL)  tablet 100 mg  100 mg Oral QHS PRN Vineeth Fell      . Valproic Acid (DEPAKENE) 250 MG/5ML syrup SYRP 1,000 mg  1,000 mg Oral QHS Kynzley Dowson   1,000 mg at 03/07/13 2234    Lab Results: No results found for this or any previous visit (from the past 48 hour(s)).  Physical Findings: AIMS: Facial and Oral Movements Muscles of Facial Expression: None, normal Lips and Perioral Area: None, normal Jaw: None, normal Tongue: None, normal,Extremity Movements Upper (arms, wrists, hands, fingers): None, normal Lower (legs, knees, ankles, toes): None, normal, Trunk Movements Neck, shoulders, hips: None, normal, Overall  Severity Severity of abnormal movements (highest score from questions above): None, normal Incapacitation due to abnormal movements: None, normal Patient's awareness of abnormal movements (rate only patient's report): No Awareness, Dental Status Current problems with teeth and/or dentures?: No Does patient usually wear dentures?: Yes  CIWA:    COWS:     Treatment Plan Summary: Daily contact with patient to assess and evaluate symptoms and progress in treatment Medication management  Plan:1. Admit for crisis management and stabilization. 2. Medication management to reduce current symptoms to base line and improve the     patient's overall level of functioning 3. Treat health problems as indicated. 4. Develop treatment plan to decrease risk of relapse upon discharge and the need for     readmission. 5. Psycho-social education regarding relapse prevention and self care. 6. Health care follow up as needed for medical problems. 7. Continue current plan of treatment   Medical Decision Making Problem Points:  Established problem, stable/improving (1), Review of last therapy session (1) and Review of psycho-social stressors (1) Data Points:  Order Aims Assessment (2) Review of medication regiment & side effects (2) Review of new medications or change in dosage (2)  I certify that inpatient services furnished can reasonably be expected to improve the patient's condition.   Jyaire Koudelka,MD 03/08/2013, 9:26 AM

## 2013-03-08 NOTE — Progress Notes (Signed)
Patient ID: Kaylee Kelley, female   DOB: [birth year], 67 y.o.   MRN: [id number]  Pt up most of the night, making noises, screaming. Pt informed not to make the noises. Pt refused medication to help ease her. When entering pt room pt gets quiet like nothing is going on, when leaving room pt tends to get loud at times.

## 2013-03-08 NOTE — [facility] Group Notes (Signed)
[facility] LCSW Aftercare Discharge Planning Group Note   03/08/2013 8:17 AM  Participation Quality:  Did not attend    [place]

## 2013-03-08 NOTE — Clinical Social Work Note (Signed)
[facility] [employer] Group Therapy  03/08/2013 , 1:27 PM    Type of Therapy:  [employer] Presentation  Participation Level:  Did not attend   Summary of Progress/Problems:  Onalee Hua from [employer] came to present his recovery story and play the guitar.    Daryel Gerald B 03/08/2013 , 1:27 PM

## 2013-03-08 NOTE — Progress Notes (Signed)
D: Pt denies SI/VH. Per pt, she hear voices sometimes. Pt pleasant this morning and compliant with taking meds. Pt continues to isolate in her room. Pt presents with brief eye contact, forwards little information and minimal interaction. Pt has poor appetite and is not eating meals. Pt asked for two cups of ice cream and consumed both cups. A: Medications administered as ordered per MD. Verbal support given. Assistants with ADLs and meals as needed. 15 minute checks performed for safety. R:  Pt continues to mumble to herself. Pt responding to internal stimuli.

## 2013-03-08 NOTE — Progress Notes (Signed)
Recreation Therapy Notes  Date: 05.21.2014 Time: 9:30am Location: [address] Dayroom      Group Topic/Focus: Leisure Education  Participation Level: Did not attend  Natosha Bou L Shaundra Fullam, LRT/CTRS  Kenijah Benningfield L 03/08/2013 12:11 PM 

## 2013-03-09 DIAGNOSIS — F200 Paranoid schizophrenia: Principal | ICD-10-CM

## 2013-03-09 MED ORDER — DIVALPROEX SODIUM 500 MG PO DR TAB
1000.0000 mg | DELAYED_RELEASE_TABLET | Freq: Every day | ORAL | Status: DC
  Filled 2013-03-09 (×2): qty 2

## 2013-03-09 MED ORDER — POTASSIUM CHLORIDE CRYS ER 20 MEQ PO TBCR
20.0000 meq | EXTENDED_RELEASE_TABLET | Freq: Every day | ORAL | Status: DC
  Filled 2013-03-09 (×2): qty 1

## 2013-03-09 MED ORDER — RISPERIDONE 3 MG PO TABS
3.0000 mg | ORAL_TABLET | Freq: Every day | ORAL | Status: DC
  Administered 2013-03-09: 3 mg via ORAL
  Filled 2013-03-09 (×2): qty 1

## 2013-03-09 NOTE — B[facility] Group Notes (Signed)
[facility] LCSW Aftercare Discharge Planning Group Note   03/09/2013 10:47 AM  Participation Quality:  Did not attend      [place]

## 2013-03-09 NOTE — Progress Notes (Signed)
Patient ID: Kaylee Kelley, female   DOB: 01-Apr-1946, 67 y.o.   MRN: [id number] D: Patient in room on approach. Pt interaction is minimal. Pt mood/affect is anxious and paranoid. Pt continues to respond to internal stimuli. Pt refused 500 mg of depakene and refused potassium.  Pt denies any needs or concerns.  Cooperative with assessment. No acute distressed noted at this time.   A: Met with pt 1:1. Medications administered as prescribed. Writer encouraged pt to discuss feelings. Pt encouraged to come to staff with any question or concerns. 15 minutes checks for safety.  R: Patient remains safe. She is complaint with medications and denies any adverse reaction. Continue current POC.

## 2013-03-09 NOTE — Progress Notes (Signed)
Psychoeducational Group Note  Date:  03/09/2013 Time:  2000  Group Topic/Focus:  Karaoke  Participation Level: Did Not Attend  Participation Quality:  Not Applicable  Affect:  Not Applicable  Cognitive:  Not Applicable  Insight:  Not Applicable  Engagement in Group: Not Applicable  Additional Comments:     Flonnie Hailstone 03/09/2013, 11:30 PM

## 2013-03-09 NOTE — Clinical Social Work Note (Signed)
[facility] Group Notes:  (Counselor/Nursing/MHT/Case Management/Adjunct)  09/01/2012 2:20 PM  Type of Therapy:  Group Therapy  Participation Level:  Did Not Attend    Summary of Progress/Problems: .balance: The topic for group was balance in life.  Pt participated in the discussion about when their life was in balance and out of balance and how this feels.  Pt discussed ways to get back in balance and short term goals they can work on to get where they want to be.    Monay Houlton B 09/01/2012, 2:20 PM  

## 2013-03-09 NOTE — Progress Notes (Signed)
Patient ID: Kaylee Kelley, female   DOB: May 26, 1946, 67 y.o.   MRN: [id number] D: Patient in room on approach. Pt interaction is minimal. Pt mood/affect is anxious and paranoid. Pt continues to respond to internal stimuli. Pt refused her evening Depakote and potassium stating  will make her sick. Pt denies any needs or concerns.  Cooperative with assessment. No acute distressed noted at this time.   A: Met with pt 1:1. Medications administered as prescribed. Writer encouraged pt to discuss feelings. Pt encouraged to come to staff with any question or concerns. 15 minutes checks for safety.  R: Patient remains safe. She is complaint with medications and denies any adverse reaction. Continue current POC.

## 2013-03-09 NOTE — Tx Team (Signed)
  Interdisciplinary Treatment Plan Update   Date Reviewed:  03/09/2013  Time Reviewed:  10:47 AM  Progress in Treatment:   Attending groups: No Participating in groups: No Taking medication as prescribed: Yes  Tolerating medication: Yes Family/Significant other contact made: Yes  Patient understands diagnosis: Yes  Discussing patient identified problems/goals with staff: Yes Medical problems stabilized or resolved: Yes Denies suicidal/homicidal ideation: Yes In tx team Patient has not harmed self or others: Yes  For review of initial/current patient goals, please see plan of care.  Estimated Length of Stay:  4-5 days  Reason for Continuation of Hospitalization: Hallucinations Medication stabilization  New Problems/Goals identified:  N/A  Discharge Plan or Barriers:   return home, follow up outpt  Additional Comments:  Kaylee Kelley continues to [employer], staying in bed most of the day.  She is responding to internal stimuli.  One can hear her having conversations with herself in her room.  Potassium changed to tablet today per pt request.  Attendees:  Signature: Thedore Mins, MD 03/09/2013 10:47 AM   Signature: Richelle Ito, LCSW 03/09/2013 10:47 AM  Signature: Verne Spurr, PA 03/09/2013 10:47 AM  Signature: Izola Price, RN 03/09/2013 10:47 AM  Signature:  03/09/2013 10:47 AM  Signature:  03/09/2013 10:47 AM  Signature:   03/09/2013 10:47 AM  Signature:    Signature:    Signature:    Signature:    Signature:    Signature:      Scribe for Treatment Team:   Richelle Ito, LCSW  03/09/2013 10:47 AM

## 2013-03-09 NOTE — Progress Notes (Signed)
D:  Patient in her room on approach this morning.  She was talking nonsense and seemed to be talking to someone else in the room when it was only her and I.  At one point she came out of her room ranting in another language and went to the dayroom.  She was very loud, speech was pressured, and she was making no sense.  She did go back to her room with minimal coaxing.  She was given one mg of lorazepam PO.  Her appetite has been fair today.  She states that her depression is "a little better" and she is feeling some anxiety.  She rates her sleep as good and denies suicidal ideation.  A:  Medications given as scheduled.  Patient needed to be redirected after being disruptive in the dayroom.  Attempted to talk with patient, but she is too disorganized at this time.  I have brought her snacks of grape juice and ice cream as requested.  R:  Patient is disorganized and disruptive to the milieu.  She is able to be redirected.  She is taking her medications and the lorazepam is effective in relieving her anxiety.  She is mostly cooperative.

## 2013-03-09 NOTE — Progress Notes (Signed)
Patient ID: Kaylee Kelley, female   DOB: [birth year], 67 y.o.   MRN: [id number] [facility] MD Progress Note  03/09/2013 2:02 PM Kaylee Kelley  MRN:  [id number]  Subjective: "I'm not too bad" and "I want to cook my own food and she won't let me cook my food" when asked about her diet at home. "go ahead and talk, I'm listening."   Objective: Patient reports that she did sleep better last night, but is still sleeping a lot during the day. She denies pain, or new problems. She is more understandable today.  She was upset about something on the unit today, but does not state what it is.  Diagnosis:  Chronic Paranoid Schizophrenia  ADL's:  Intact  Sleep: Fair  Appetite:  increasing  Suicidal Ideation:  Plan:  denied Intent:  denied Means:  denied Homicidal Ideation:  Plan:  denied Intent:  denied Means:  denied AEB (as evidenced by):  Psychiatric Specialty Exam: Review of Systems  Constitutional: Negative.  Negative for fever, chills, weight loss, malaise/fatigue and diaphoresis.  HENT: Negative.  Negative for congestion and sore throat.   Eyes: Negative.  Negative for blurred vision, double vision and photophobia.  Respiratory: Negative.  Negative for cough, shortness of breath and wheezing.   Cardiovascular: Negative.  Negative for chest pain, palpitations and PND.  Gastrointestinal: Negative.  Negative for heartburn, nausea, vomiting, abdominal pain, diarrhea and constipation.  Genitourinary: Negative.   Musculoskeletal: Positive for myalgias. Negative for joint pain and falls.  Skin: Negative.   Neurological: Negative.  Negative for dizziness, tingling, tremors, sensory change, speech change, focal weakness, seizures, loss of consciousness, weakness and headaches.  Endo/Heme/Allergies: Negative.  Negative for polydipsia. Does not bruise/bleed easily.  Psychiatric/Behavioral: Positive for hallucinations. Negative for depression, suicidal ideas, memory loss and substance abuse. The patient is  not nervous/anxious and does not have insomnia.     Blood pressure 109/64, pulse 86, temperature 98.4 F (36.9 C), temperature source Oral, resp. rate 12, height 5\' 1"  (1.549 m), weight 42.185 kg (93 lb), SpO2 100.00%.Body mass index is 17.58 kg/(m^2).  General Appearance: Disheveled  Eye Contact::  closed  Speech:  Slower today, and clearer  Volume:  Decreased  Mood:  depressed  Affect:  Blunt  Thought Process:  Disorganized  Orientation:  Only to person  Thought Content:  Hallucinations: Auditory  Suicidal Thoughts:  No  Homicidal Thoughts:  No  Memory:  Immediate;   Poor Recent;   Poor Remote;   Fair  Judgement:  Poor  Insight:  Lacking  Psychomotor Activity:  Decreased  Concentration:  Poor  Recall:  Poor  Akathisia:  No  Handed:  Right  AIMS (if indicated):     Assets:  Desire for Improvement Social Support  Sleep:  Number of Hours: 4.75   Current Medications: Current Facility-Administered Medications  Medication Dose Route Frequency Provider Last Rate Last Dose  . acetaminophen (TYLENOL) tablet 650 mg  650 mg Oral Q6H PRN Shuvon Rankin, NP      . alum & mag hydroxide-simeth (MAALOX/MYLANTA) 200-200-20 MG/5ML suspension 30 mL  30 mL Oral Q4H PRN Shuvon Rankin, NP      . divalproex (DEPAKOTE) DR tablet 1,000 mg  1,000 mg Oral QHS Mojeed Akintayo      . feeding supplement (RESOURCE BREEZE) liquid 1 Container  1 Container Oral TID BM Jeoffrey Massed, RD   1 Container at 03/09/13 1019  . ferrous sulfate tablet 325 mg  325 mg Oral Q breakfast Mojeed Akintayo  325 mg at 03/09/13 0800  . fluticasone (FLONASE) 50 MCG/ACT nasal spray 2 spray  2 spray Each Nare Daily Shuvon Rankin, NP      . hydrochlorothiazide (HYDRODIURIL) tablet 25 mg  25 mg Oral Daily Shuvon Rankin, NP   25 mg at 03/09/13 0800  . irbesartan (AVAPRO) tablet 150 mg  150 mg Oral Daily Shuvon Rankin, NP   150 mg at 03/09/13 0800  . LORazepam (ATIVAN) tablet 1 mg  1 mg Oral Q8H PRN Shuvon Rankin, NP   1 mg at  03/09/13 1012  . magnesium hydroxide (MILK OF MAGNESIA) suspension 30 mL  30 mL Oral Daily PRN Shuvon Rankin, NP      . potassium chloride SA (K-DUR,KLOR-CON) CR tablet 20 mEq  20 mEq Oral QHS Mojeed Akintayo      . risperiDONE (RISPERDAL) tablet 2 mg  2 mg Oral QHS Verne Spurr, PA-C   2 mg at 03/08/13 2128  . sertraline (ZOLOFT) tablet 100 mg  100 mg Oral Daily Shuvon Rankin, NP   100 mg at 03/09/13 0800  . traZODone (DESYREL) tablet 100 mg  100 mg Oral QHS PRN Mojeed Akintayo        Lab Results: No results found for this or any previous visit (from the past 48 hour(s)).  Physical Findings: AIMS: Facial and Oral Movements Muscles of Facial Expression: None, normal Lips and Perioral Area: None, normal Jaw: None, normal Tongue: None, normal,Extremity Movements Upper (arms, wrists, hands, fingers): None, normal Lower (legs, knees, ankles, toes): None, normal, Trunk Movements Neck, shoulders, hips: None, normal, Overall Severity Severity of abnormal movements (highest score from questions above): None, normal Incapacitation due to abnormal movements: None, normal Patient's awareness of abnormal movements (rate only patient's report): No Awareness, Dental Status Current problems with teeth and/or dentures?: No Does patient usually wear dentures?: Yes  CIWA:    COWS:     Treatment Plan Summary: Daily contact with patient to assess and evaluate symptoms and progress in treatment Medication management  Plan: 1. Continue crisis management and stabilization. 2. Medication management to reduce current symptoms to base line and improve the patient's overall level of functioning 3. Treat health problems as indicated. 4. Develop treatment plan to decrease risk of relapse upon discharge and the need for readmission. 5. Psycho-social education regarding relapse prevention and self care. 6. Health care follow up as needed for medical problems. 7. Continue current plan of treatment 8. Will  increase Risperdal to 3mg  at hs for psychosis. 9. Will order daily weights to follow intake. 10. ELOS: 5-6 days Medical Decision Making Problem Points:  Established problem, stable/improving (1), Review of last therapy session (1) and Review of psycho-social stressors (1) Data Points:  Order Aims Assessment (2) Review of medication regiment & side effects (2) Review of new medications or change in dosage (2)  I certify that inpatient services furnished can reasonably be expected to improve the patient's condition.  Rona Ravens. Linwood Gullikson RPAC 2:07 PM 03/09/2013

## 2013-03-09 NOTE — Progress Notes (Signed)
Psychoeducational Group Note  Date:  03/09/2013 Time:  0930  Group Topic/Focus:  Rediscovering Joy:   The focus of this group is to explore various ways to relieve stress in a positive manner.  Participation Level: Did Not Attend  Participation Quality:  Not Applicable  Affect:  Not Applicable  Cognitive:  Not Applicable  Insight:  Not Applicable  Engagement in Group: Not Applicable  Additional Comments:  Pt refused to attend group this morning.  Janayah Zavada E 03/09/2013, 10:57 AM

## 2013-03-09 NOTE — Care Management Utilization Note (Signed)
   Per State Regulation 482.30  This chart was reviewed for necessity with respect to the patient's Admission/ Duration of stay.  Next review date: 03/12/13  Nicolasa Ducking RN, BSN

## 2013-03-10 MED ORDER — RISPERIDONE 0.5 MG PO TABS
1.5000 mg | ORAL_TABLET | Freq: Two times a day (BID) | ORAL | Status: DC
  Administered 2013-03-10 – 2013-03-12 (×4): 1.5 mg via ORAL
  Filled 2013-03-10 (×6): qty 3

## 2013-03-10 MED ORDER — POTASSIUM CHLORIDE 20 MEQ/15ML (10%) PO LIQD
20.0000 meq | Freq: Every day | ORAL | Status: DC
  Administered 2013-03-10: 20 meq via ORAL
  Filled 2013-03-10 (×6): qty 15

## 2013-03-10 MED ORDER — DIVALPROEX SODIUM 125 MG PO CPSP
1000.0000 mg | ORAL_CAPSULE | Freq: Every day | ORAL | Status: DC
  Administered 2013-03-14 – 2013-03-16 (×3): 1000 mg via ORAL
  Filled 2013-03-10 (×12): qty 8

## 2013-03-10 NOTE — Tx Team (Signed)
  Interdisciplinary Treatment Plan Update   Date Reviewed:  03/10/2013  Time Reviewed:  10:56 AM  Progress in Treatment:   Attending groups: Yes Participating in groups: Yes Taking medication as prescribed: Yes  Tolerating medication: Yes Family/Significant other contact made: Yes  Patient understands diagnosis: Yes  Discussing patient identified problems/goals with staff: Yes Medical problems stabilized or resolved: Yes Denies suicidal/homicidal ideation: Yes Patient has not harmed self or others: Yes  For review of initial/current patient goals, please see plan of care.  Estimated Length of Stay:  3-5 days  Reason for Continuation of Hospitalization: Hallucinations Medication stabilization  New Problems/Goals identified:  N/A  Discharge Plan or Barriers:   return home, follow up ACT team  Additional Comments:  Kaylee Kelley remains isolative, not participating in milieu, less agitated and willing to take medications with insistence, cajoling.    Attendees:  Signature: Thedore Mins, MD 03/10/2013 10:56 AM   Signature: Richelle Ito, LCSW 03/10/2013 10:56 AM  Signature:  03/10/2013 10:56 AM  Signature: Nestor Ramp, RN 03/10/2013 10:56 AM  Signature: 03/10/2013 10:56 AM  Signature:  03/10/2013 10:56 AM  Signature:   03/10/2013 10:56 AM  Signature:    Signature:    Signature:    Signature:    Signature:    Signature:      Scribe for Treatment Team:   Richelle Ito, LCSW  03/10/2013 10:56 AM

## 2013-03-10 NOTE — [facility] Group Notes (Signed)
[facility] LCSW Aftercare Discharge Planning Group Note   03/10/2013 10:38 AM  Participation Quality:  Did not attend    [place]

## 2013-03-10 NOTE — Progress Notes (Signed)
Psychoeducational Group Note  Date:  03/10/2013 Time:  2000  Group Topic/Focus:  Wrap-Up Group:   The focus of this group is to help patients review their daily goal of treatment and discuss progress on daily workbooks.  Participation Level: Did Not Attend  Participation Quality:  Not Applicable  Affect:  Not Applicable  Cognitive:  Not Applicable  Insight:  Not Applicable  Engagement in Group: Not Applicable  Additional Comments:    Flonnie Hailstone 03/10/2013, 9:17 PM

## 2013-03-10 NOTE — [facility] Suicide Risk Assessment (Signed)
[facility] INPATIENT:  Family/Significant Other Suicide Prevention Education  Suicide Prevention Education:  Education Completed; No one has been identified by the patient as the family member/significant other with whom the patient will be residing, and identified as the person(s) who will aid the patient in the event of a mental health crisis (suicidal ideations/suicide attempt).  With written consent from the patient, the family member/significant other has been provided the following suicide prevention education, prior to the and/or following the discharge of the patient.  The suicide prevention education provided includes the following:  Suicide risk factors  Suicide prevention and interventions  National Suicide Hotline telephone number  [facility] assessment telephone number  [facility] Emergency Assistance 911  [facility] and/or Residential Mobile Crisis Unit telephone number  Request made of family/significant other to:  Remove weapons (e.g., guns, rifles, knives), all items previously/currently identified as safety concern.    Remove drugs/medications (over-the-counter, prescriptions, illicit drugs), all items previously/currently identified as a safety concern.  The family member/significant other verbalizes understanding of the suicide prevention education information provided.  The family member/significant other agrees to remove the items of safety concern listed above.  Pt was not suicidal at time of admission, nor has she complained of SI while here.  SPE not required.  Daryel Gerald B 03/10/2013, 3:08 PM

## 2013-03-10 NOTE — Progress Notes (Signed)
Patient ID: Kaylee Kelley, female   DOB: 09-Jan-1946, 67 y.o.   MRN: [id number] Pt stated will not take Pill because it was too big. Writer had med changed to liquid pt refused.

## 2013-03-10 NOTE — Progress Notes (Signed)
Adult Psychoeducational Group Note  Date: 03/10/2013  Time: 12:39 PM  Group Topic/Focus:  Relapse Prevention Planning: The focus of this group is to define relapse and discuss the need for planning to combat relapse.  Participation Level: Did Not Attend  Participation Quality:  Affect:  Cognitive:  Insight:  Engagement in Group:  Modes of Intervention:  Additional Comments: Pt refused to attend, pt stays in the room and talks to herself. Kaylee Kelley M  03/10/2013, 12:39 PM

## 2013-03-10 NOTE — Progress Notes (Addendum)
  D) Patient appearsdisorganized and anxious upon my assessment. Patient unable to complete Patient Self Inventory.  Patient denies SI/HI. Patient childlike and hyperverbal. Patient easily agitated, verbalizes "why didn't my trash get picked up?" Patient redirected, demonstrates understanding. Patient appears to be responding to internal stimuli, observed talking to self as well as inanimate objects in room.   A) Patient offered support and encouragement, patient encouraged to discuss feelings/concerns with staff. Patient demonstrates understanding. Patient monitored Q15 minutes for safety. Patient reminded of increased risk for falls, patient encouraged to call for help if needed. Patient demonstrates understanding.   R) Patient isolates to room at times, patient demands medications be brought to her in bed. Patient inappropriate with staff at times, redirectable.    Patient taking medications as ordered with maximum encouragement from staff. Will continue to monitor.

## 2013-03-10 NOTE — Progress Notes (Signed)
Patient ID: Kaylee Kelley, female   DOB: [birth year], 67 y.o.   MRN: [id number] [facility] MD Progress Note  03/10/2013 9:40 AM Kaylee Kelley  MRN:  [id number]  Subjective: "I want Vanilla ice cream". Objective: Patient says that she has been sleeping better. However, she remains delusional, and observed talking to herself, endorsed hearing voices and her thought process remains disorganized.She has been showing decreased agitation and mood swings but still socially isolated herself from the rest of the patients. She has not been participating fully in unit activities. She is compliant with her medications and has not reports any adverse reactions to them.  Diagnosis:  Chronic Paranoid Schizophrenia   ADL's:  Intact  Sleep: Fair  Appetite:  Poor  Suicidal Ideation:  Plan:  denied Intent:  denied Means:  denied Homicidal Ideation:  Plan:  denied Intent:  denied Means:  denied AEB (as evidenced by):  Psychiatric Specialty Exam: Review of Systems  Constitutional: Negative.   HENT: Negative.   Eyes: Negative.   Respiratory: Negative.   Cardiovascular: Negative.   Gastrointestinal: Negative.   Genitourinary: Negative.   Musculoskeletal: Positive for myalgias.  Skin: Negative.   Neurological: Negative.   Endo/Heme/Allergies: Negative.   Psychiatric/Behavioral: Positive for hallucinations.    Blood pressure 138/61, pulse 76, temperature 97.3 F (36.3 C), temperature source Oral, resp. rate 16, height 5\' 1"  (1.549 m), weight 42.185 kg (93 lb), SpO2 100.00%.Body mass index is 17.58 kg/(m^2).  General Appearance: Disheveled  Eye Contact::  Poor  Speech:  Garbled and Pressured  Volume:  Decreased  Mood:  Dysphoric  Affect:  Blunt  Thought Process:  Disorganized  Orientation:  Only to person  Thought Content:  Hallucinations: Auditory  Suicidal Thoughts:  No  Homicidal Thoughts:  No  Memory:  Immediate;   Poor Recent;   Poor Remote;   Fair  Judgement:  Poor  Insight:  Lacking   Psychomotor Activity:  Decreased  Concentration:  Poor  Recall:  Poor  Akathisia:  No  Handed:  Right  AIMS (if indicated):     Assets:  Desire for Improvement Social Support  Sleep:  Number of Hours: 5.5   Current Medications: Current Facility-Administered Medications  Medication Dose Route Frequency Provider Last Rate Last Dose  . acetaminophen (TYLENOL) tablet 650 mg  650 mg Oral Q6H PRN Shuvon Rankin, NP      . alum & mag hydroxide-simeth (MAALOX/MYLANTA) 200-200-20 MG/5ML suspension 30 mL  30 mL Oral Q4H PRN Shuvon Rankin, NP      . divalproex (DEPAKOTE SPRINKLE) capsule 1,000 mg  1,000 mg Oral QHS Aella Ronda      . feeding supplement (RESOURCE BREEZE) liquid 1 Container  1 Container Oral TID BM Jeoffrey Massed, RD   1 Container at 03/10/13 [id number]  . ferrous sulfate tablet 325 mg  325 mg Oral Q breakfast Anjulie Dipierro   325 mg at 03/10/13 0838  . fluticasone (FLONASE) 50 MCG/ACT nasal spray 2 spray  2 spray Each Nare Daily Shuvon Rankin, NP   2 spray at 03/10/13 5621  . hydrochlorothiazide (HYDRODIURIL) tablet 25 mg  25 mg Oral Daily Shuvon Rankin, NP   25 mg at 03/10/13 3086  . irbesartan (AVAPRO) tablet 150 mg  150 mg Oral Daily Shuvon Rankin, NP   150 mg at 03/10/13 5784  . LORazepam (ATIVAN) tablet 1 mg  1 mg Oral Q8H PRN Shuvon Rankin, NP   1 mg at 03/09/13 1012  . magnesium hydroxide (MILK OF MAGNESIA) suspension 30 mL  30 mL Oral Daily PRN Shuvon Rankin, NP      . potassium chloride 20 MEQ/15ML (10%) liquid 20 mEq  20 mEq Oral QHS Tobie Hellen      . risperiDONE (RISPERDAL) tablet 3 mg  3 mg Oral QHS Verne Spurr, PA-C   3 mg at 03/09/13 2211  . sertraline (ZOLOFT) tablet 100 mg  100 mg Oral Daily Shuvon Rankin, NP   100 mg at 03/10/13 1610  . traZODone (DESYREL) tablet 100 mg  100 mg Oral QHS PRN Jlyn Bracamonte        Lab Results: No results found for this or any previous visit (from the past 48 hour(s)).  Physical Findings: AIMS: Facial and Oral  Movements Muscles of Facial Expression: None, normal Lips and Perioral Area: None, normal Jaw: None, normal Tongue: None, normal,Extremity Movements Upper (arms, wrists, hands, fingers): None, normal Lower (legs, knees, ankles, toes): None, normal, Trunk Movements Neck, shoulders, hips: None, normal, Overall Severity Severity of abnormal movements (highest score from questions above): None, normal Incapacitation due to abnormal movements: None, normal Patient's awareness of abnormal movements (rate only patient's report): No Awareness, Dental Status Current problems with teeth and/or dentures?: No Does patient usually wear dentures?: Yes  CIWA:    COWS:     Treatment Plan Summary: Daily contact with patient to assess and evaluate symptoms and progress in treatment Medication management  Plan:1. Admit for crisis management and stabilization. 2. Medication management to reduce current symptoms to base line and improve the     patient's overall level of functioning 3. Treat health problems as indicated. 4. Develop treatment plan to decrease risk of relapse upon discharge and the need for     readmission. 5. Psycho-social education regarding relapse prevention and self care. 6. Health care follow up as needed for medical problems. 7. Will continue current medical regimen. 8. Valproic acid level on 03/13/13  Medical Decision Making Problem Points:  Established problem, stable/improving (1), Review of last therapy session (1) and Review of psycho-social stressors (1) Data Points:  Order Aims Assessment (2) Review of medication regiment & side effects (2) Review of new medications or change in dosage (2)  I certify that inpatient services furnished can reasonably be expected to improve the patient's condition.   Keyona Emrich,MD 03/10/2013, 9:40 AM

## 2013-03-11 NOTE — Progress Notes (Signed)
Patient ID: Kaylee Kelley, female   DOB: Nov 07, 1945, 67 y.o.   MRN: [id number] [facility] MD Progress Note  03/11/2013 10:37 AM Itzia Cunliffe  MRN:  [id number]  Subjective: "I am hurting inside ". Objective: Patient seen and chart reviewed.  She remains delusional, labile, confused and observed talking to herself.  Her thoughts remaines disorganized.she gets easily irritable and agitated .  She was noticed talking to herself most of the time.  She's compliant with the medication and did not report any side effects.  There were no tremors or shakes. She has not been participating fully in unit activities.  She states to herself.   Diagnosis:  Chronic Paranoid Schizophrenia   ADL's:  Intact  Sleep: Fair  Appetite:  Poor  Suicidal Ideation:  Plan:  denied Intent:  denied Means:  denied Homicidal Ideation:  Plan:  denied Intent:  denied Means:  denied AEB (as evidenced by):  Psychiatric Specialty Exam: Review of Systems  Constitutional: Negative.   HENT: Negative.   Eyes: Negative.   Respiratory: Negative.   Cardiovascular: Negative.   Gastrointestinal: Negative.   Genitourinary: Negative.   Musculoskeletal: Positive for myalgias.  Skin: Negative.   Neurological: Negative.   Endo/Heme/Allergies: Negative.   Psychiatric/Behavioral: Positive for depression and hallucinations. The patient is nervous/anxious and has insomnia.     Blood pressure 115/63, pulse 72, temperature 97.4 F (36.3 C), temperature source Oral, resp. rate 16, height 5\' 1"  (1.549 m), weight 42.185 kg (93 lb), SpO2 100.00%.Body mass index is 17.58 kg/(m^2).  General Appearance: Disheveled  Eye Contact::  Poor  Speech:  Garbled and Pressured  Volume:  Decreased  Mood:  Dysphoric  Affect:  Blunt  Thought Process:  Disorganized  Orientation:  Only to person  Thought Content:  Hallucinations: Auditory  Suicidal Thoughts:  No  Homicidal Thoughts:  No  Memory:  Immediate;   Poor Recent;   Poor Remote;   Fair   Judgement:  Poor  Insight:  Lacking  Psychomotor Activity:  Decreased  Concentration:  Poor  Recall:  Poor  Akathisia:  No  Handed:  Right  AIMS (if indicated):     Assets:  Desire for Improvement Social Support  Sleep:  Number of Hours: 0   Current Medications: Current Facility-Administered Medications  Medication Dose Route Frequency Provider Last Rate Last Dose  . acetaminophen (TYLENOL) tablet 650 mg  650 mg Oral Q6H PRN Shuvon Rankin, NP      . alum & mag hydroxide-simeth (MAALOX/MYLANTA) 200-200-20 MG/5ML suspension 30 mL  30 mL Oral Q4H PRN Shuvon Rankin, NP      . divalproex (DEPAKOTE SPRINKLE) capsule 1,000 mg  1,000 mg Oral QHS Mojeed Akintayo      . feeding supplement (RESOURCE BREEZE) liquid 1 Container  1 Container Oral TID BM Jeoffrey Massed, RD   1 Container at 03/10/13 2000  . ferrous sulfate tablet 325 mg  325 mg Oral Q breakfast Mojeed Akintayo   325 mg at 03/10/13 0838  . fluticasone (FLONASE) 50 MCG/ACT nasal spray 2 spray  2 spray Each Nare Daily Shuvon Rankin, NP   2 spray at 03/10/13 2725  . hydrochlorothiazide (HYDRODIURIL) tablet 25 mg  25 mg Oral Daily Shuvon Rankin, NP   25 mg at 03/11/13 1019  . irbesartan (AVAPRO) tablet 150 mg  150 mg Oral Daily Shuvon Rankin, NP   150 mg at 03/11/13 1019  . LORazepam (ATIVAN) tablet 1 mg  1 mg Oral Q8H PRN Shuvon Rankin, NP   1 mg  at 03/09/13 1012  . magnesium hydroxide (MILK OF MAGNESIA) suspension 30 mL  30 mL Oral Daily PRN Shuvon Rankin, NP      . potassium chloride 20 MEQ/15ML (10%) liquid 20 mEq  20 mEq Oral QHS Mojeed Akintayo   20 mEq at 03/10/13 2244  . risperiDONE (RISPERDAL) tablet 1.5 mg  1.5 mg Oral BID Mojeed Akintayo   1.5 mg at 03/11/13 0814  . sertraline (ZOLOFT) tablet 100 mg  100 mg Oral Daily Shuvon Rankin, NP   100 mg at 03/11/13 1019  . traZODone (DESYREL) tablet 100 mg  100 mg Oral QHS PRN Mojeed Akintayo        Lab Results: No results found for this or any previous visit (from the past 48  hour(s)).  Physical Findings: AIMS: Facial and Oral Movements Muscles of Facial Expression: None, normal Lips and Perioral Area: None, normal Jaw: None, normal Tongue: None, normal,Extremity Movements Upper (arms, wrists, hands, fingers): None, normal Lower (legs, knees, ankles, toes): None, normal, Trunk Movements Neck, shoulders, hips: None, normal, Overall Severity Severity of abnormal movements (highest score from questions above): None, normal Incapacitation due to abnormal movements: None, normal Patient's awareness of abnormal movements (rate only patient's report): No Awareness, Dental Status Current problems with teeth and/or dentures?: No Does patient usually wear dentures?: Yes  CIWA:    COWS:     Treatment Plan Summary: Daily contact with patient to assess and evaluate symptoms and progress in treatment Medication management  Plan:1. Admit for crisis management and stabilization. 2. Medication management to reduce current symptoms to base line and improve the     patient's overall level of functioning 3. Treat health problems as indicated. 4. Develop treatment plan to decrease risk of relapse upon discharge and the need for     readmission. 5. Psycho-social education regarding relapse prevention and self care. 6. Health care follow up as needed for medical problems. 7. Will continue current medical regimen. 8. Valproic acid level on 03/13/13  Medical Decision Making Problem Points:  Established problem, stable/improving (1), Review of last therapy session (1) and Review of psycho-social stressors (1) Data Points:  Order Aims Assessment (2) Review of medication regiment & side effects (2) Review of new medications or change in dosage (2)  I certify that inpatient services furnished can reasonably be expected to improve the patient's condition.   Rylend Pietrzak T.,MD 03/11/2013, 10:37 AM

## 2013-03-11 NOTE — Progress Notes (Signed)
Patient ID: Kaylee Kelley, female   DOB: [birth year], 67 y.o.   MRN: [id number] Psychoeducational Group Note  Date:  03/11/2013 Time:1000am  Group Topic/Focus:  Identifying Needs:   The focus of this group is to help patients identify their personal needs that have been historically problematic and identify healthy behaviors to address their needs.  Participation Level:  Did Not Attend  Participation Quality:    Affect:  Cognitive:   Insight:   Engagement in Group:   Additional Comments:  uable to attend; inventory and Psychoeducational group   Valente David 03/11/2013,10:47 AM

## 2013-03-11 NOTE — [facility] Group Notes (Signed)
[facility] Group Notes: (Clinical Social Work)   03/11/2013      Type of Therapy:  Group Therapy   Participation Level:  Did Not Attend    Ambrose Mantle, LCSW 03/11/2013, 12:32 PM

## 2013-03-11 NOTE — Progress Notes (Addendum)
Patient ID: Kaylee Kelley, female   DOB: 1945/12/08, 66 y.o.   MRN: [id number] 03-11-13 @ 1513 nursing shift note; D: pt remains delusional, labile, confused and talks to her self. Her thinking is disorganized and she is easily agitated. A: when administering her medications if just a few pills at a time are put in the cup she is more likely to take the medications. Her pathology smear is not done yet and RN called lab and lab stated they would do the pathology smear on Tues. She did not fill out her inventory sheet. RN will continue to monitor and Q 15 min ck's continue.

## 2013-03-12 MED ORDER — RISPERIDONE 2 MG PO TABS
2.0000 mg | ORAL_TABLET | Freq: Two times a day (BID) | ORAL | Status: DC
  Administered 2013-03-12 – 2013-03-16 (×8): 2 mg via ORAL
  Filled 2013-03-12 (×12): qty 1

## 2013-03-12 NOTE — Progress Notes (Signed)
Patient ID: Kaylee Kelley, female   DOB: 1945/12/01, 67 y.o.   MRN: [id number] D: "Just leave it (Breeze food supplement) there (at the bedside)".   A: Pt. could not answer questions about her S/H/I status by rambling incoherently through most of the AM and PM, noted even when Pt. was seen sitting in her chair in her room without another person seen.  Pt. now seen lying in bed in her darkened room, but roused when approached with her mid-day drink (as above).  Drink was left at the bedside and MHT instructed to observe Pt. to seen if she will drink it by supper time.  Pt. was med-compliant, but refused to attend groups and was unable to be coherent enough this AM or PM to attend.  Pt. stayed in her room, wither sitting in a chair or lying in bed.  R: Will continue to observe for changes.

## 2013-03-12 NOTE — Progress Notes (Signed)
Writer entered patient room and observed her lying in bed with her head covered up. She was awake and asked who I was when she heard the door open. Writer introduced self to patient and she immediately started talking to herself. Patient continues to have disorganized thoughts, and started to become agitated the more I tried to talk with her. Patient is safe, 15 min checks, will continue to monitor.

## 2013-03-12 NOTE — Progress Notes (Signed)
Patient ID: Hebah Bogosian, female   DOB: Nov 13, 1945, 67 y.o.   MRN: [id number] Psychoeducational Group Note  Date:  03/12/2013 Time:  1000am  Group Topic/Focus:  Making Healthy Choices:   The focus of this group is to help patients identify negative/unhealthy choices they were using prior to admission and identify positive/healthier coping strategies to replace them upon discharge.  Participation Level:  Did Not Attend  Participation Quality:    Affect:  Cognitive:    Insight:   Engagement in Group:   Additional Comments:  Psychoeducational group -pt unable to participate  Valente David 03/12/2013,10:49 AM

## 2013-03-12 NOTE — [facility] Group Notes (Signed)
[facility] Group Notes: (Clinical Social Work)   03/12/2013      Type of Therapy:  Group Therapy   Participation Level:  Did Not Attend    Ambrose Mantle, LCSW 03/12/2013, 12:43 PM

## 2013-03-12 NOTE — Progress Notes (Addendum)
Patient ID: Kaylee Kelley, female   DOB: Dec 17, 1945, 67 y.o.   MRN: [id number] [facility] MD Progress Note  03/12/2013 9:44 AM Daisee Centner  MRN:  [id number]  Subjective: "I am not a housekeeper ". Objective: Patient seen and chart reviewed.  She remains delusional, labile, confused and observed talking to herself.  She is easily irritable and her thoughts remaines disorganized.  She is not going to groups.  She gets sometimes disrupted and agitated.  As per staff she is taking the medication and denies any side effects.  She was noticed talking to herself most of the time.  There were no tremors or shakes.  She stays to herself most of the time.     Diagnosis:  Chronic Paranoid Schizophrenia   ADL's:  Intact  Sleep: Fair  Appetite:  Poor  Suicidal Ideation:  Plan:  denied Intent:  denied Means:  denied Homicidal Ideation:  Plan:  denied Intent:  denied Means:  denied AEB (as evidenced by):  Psychiatric Specialty Exam: Review of Systems  Constitutional: Negative.   HENT: Negative.   Eyes: Negative.   Respiratory: Negative.   Cardiovascular: Negative.   Gastrointestinal: Negative.   Genitourinary: Negative.   Musculoskeletal: Positive for myalgias.  Skin: Negative.   Neurological: Negative.   Endo/Heme/Allergies: Negative.   Psychiatric/Behavioral: Positive for depression and hallucinations. The patient is nervous/anxious and has insomnia.        Labile and irritable and easily agitated    Blood pressure 128/71, pulse 74, temperature 97.1 F (36.2 C), temperature source Oral, resp. rate 18, height 5\' 1"  (1.549 m), weight 50.349 kg (111 lb), SpO2 100.00%.Body mass index is 20.98 kg/(m^2).  General Appearance: Disheveled  Eye Contact::  Poor  Speech:  Garbled and Pressured  Volume:  Decreased  Mood:  Dysphoric  Affect:  Blunt  Thought Process:  Disorganized  Orientation:  Only to person  Thought Content:  Hallucinations: Auditory  Suicidal Thoughts:  No  Homicidal Thoughts:   No  Memory:  Immediate;   Poor Recent;   Poor Remote;   Fair  Judgement:  Poor  Insight:  Lacking  Psychomotor Activity:  Decreased  Concentration:  Poor  Recall:  Poor  Akathisia:  No  Handed:  Right  AIMS (if indicated):     Assets:  Desire for Improvement Social Support  Sleep:  Number of Hours: 0   Current Medications: Current Facility-Administered Medications  Medication Dose Route Frequency Provider Last Rate Last Dose  . acetaminophen (TYLENOL) tablet 650 mg  650 mg Oral Q6H PRN Shuvon Rankin, NP   650 mg at 03/11/13 1124  . alum & mag hydroxide-simeth (MAALOX/MYLANTA) 200-200-20 MG/5ML suspension 30 mL  30 mL Oral Q4H PRN Shuvon Rankin, NP      . divalproex (DEPAKOTE SPRINKLE) capsule 1,000 mg  1,000 mg Oral QHS Mojeed Akintayo      . feeding supplement (RESOURCE BREEZE) liquid 1 Container  1 Container Oral TID BM Jeoffrey Massed, RD   1 Container at 03/11/13 1444  . ferrous sulfate tablet 325 mg  325 mg Oral Q breakfast Mojeed Akintayo   325 mg at 03/12/13 7829  . fluticasone (FLONASE) 50 MCG/ACT nasal spray 2 spray  2 spray Each Nare Daily Shuvon Rankin, NP   2 spray at 03/10/13 5621  . hydrochlorothiazide (HYDRODIURIL) tablet 25 mg  25 mg Oral Daily Shuvon Rankin, NP   25 mg at 03/12/13 0820  . irbesartan (AVAPRO) tablet 150 mg  150 mg Oral Daily Shuvon Rankin, NP  150 mg at 03/12/13 0821  . LORazepam (ATIVAN) tablet 1 mg  1 mg Oral Q8H PRN Shuvon Rankin, NP   1 mg at 03/09/13 1012  . magnesium hydroxide (MILK OF MAGNESIA) suspension 30 mL  30 mL Oral Daily PRN Shuvon Rankin, NP      . potassium chloride 20 MEQ/15ML (10%) liquid 20 mEq  20 mEq Oral QHS Mojeed Akintayo   20 mEq at 03/10/13 2244  . risperiDONE (RISPERDAL) tablet 1.5 mg  1.5 mg Oral BID Mojeed Akintayo   1.5 mg at 03/12/13 0818  . sertraline (ZOLOFT) tablet 100 mg  100 mg Oral Daily Shuvon Rankin, NP   100 mg at 03/12/13 0454  . traZODone (DESYREL) tablet 100 mg  100 mg Oral QHS PRN Mojeed Akintayo         Lab Results: No results found for this or any previous visit (from the past 48 hour(s)).  Physical Findings: AIMS: Facial and Oral Movements Muscles of Facial Expression: None, normal Lips and Perioral Area: None, normal Jaw: None, normal Tongue: None, normal,Extremity Movements Upper (arms, wrists, hands, fingers): None, normal Lower (legs, knees, ankles, toes): None, normal, Trunk Movements Neck, shoulders, hips: None, normal, Overall Severity Severity of abnormal movements (highest score from questions above): None, normal Incapacitation due to abnormal movements: None, normal Patient's awareness of abnormal movements (rate only patient's report): No Awareness, Dental Status Current problems with teeth and/or dentures?: No Does patient usually wear dentures?: Yes  CIWA:    COWS:     Treatment Plan Summary: Daily contact with patient to assess and evaluate symptoms and progress in treatment Medication management  Plan:1. Admit for crisis management and stabilization. 2. Medication management to reduce current symptoms to base line and improve the     patient's overall level of functioning 3. Treat health problems as indicated. 4. Develop treatment plan to decrease risk of relapse upon discharge and the need for     readmission. 5. Psycho-social education regarding relapse prevention and self care. 6. Health care follow up as needed for medical problems. 7. Increase Risperdal to 2 mg at twice a day.   8. Valproic acid level on 03/13/13  Medical Decision Making Problem Points:  Established problem, stable/improving (1), Review of last therapy session (1) and Review of psycho-social stressors (1) Data Points:  Order Aims Assessment (2) Review of medication regiment & side effects (2) Review of new medications or change in dosage (2)  I certify that inpatient services furnished can reasonably be expected to improve the patient's condition.   Ariannie Penaloza T.,MD 03/12/2013,  9:44 AM

## 2013-03-13 LAB — VALPROIC ACID LEVEL: Valproic Acid Lvl: <10 ug/mL — ABNORMAL LOW (ref 50.0–100.0)

## 2013-03-13 MED ORDER — LORAZEPAM 0.5 MG PO TABS
0.5000 mg | ORAL_TABLET | Freq: Two times a day (BID) | ORAL | Status: DC
  Administered 2013-03-13 – 2013-03-16 (×7): 0.5 mg via ORAL
  Filled 2013-03-13 (×8): qty 1

## 2013-03-13 NOTE — Progress Notes (Signed)
D: Pt denies SI/HI/AVH. Pt anxious and agitated this morning. Pt presents with loud, rapid, pressured speech. Pt responding to internal stimuli, pt seen talking to herself continuously throughout the morning. Pt have a flight of ideas, disorganized thoughts and tangential speech. Pt refuses to allow housekeeping to clean her room, pt insist on sweeping the floor with her foot. A: Medications administered as ordered per MD. Medications adjusted per MD. Monitor pt for side affects of new med orders/adjustments. Continue to need redirect pt as needed. Assist pt with ADLs as needed. Verbal support given. 15 minute checks performed for safety. R: Irritable mood. Labile. Minimal interaction on the milieu.

## 2013-03-13 NOTE — Progress Notes (Signed)
Patient ID: Kaylee Kelley, female   DOB: September 19, 1946, 67 y.o.   MRN: [id number] [facility] MD Progress Note  03/13/2013 10:45 AM Kaylee Kelley  MRN:  [id number]  Subjective: "I am really upset this morning, someone was trying to clean my room and I told her I can clean it with my foot and she refused to let me do it". Objective: Patient is extremely agitated, irritable and  Labile this morning. She remains paranoid, talkative, confused and tangential. Her thought process remains disorganized and has been observed talking to herself non-stop. Staffs reports that she has not been sleeping well at night. She is socially withdrawn and isolates herself in her room with minimal interactions with peers. Staffs reports that  she is taking her medications and denies any side effects.   Diagnosis:  Chronic Paranoid Schizophrenia   ADL's:  Intact  Sleep: Fair  Appetite:  Poor  Suicidal Ideation:  Plan:  denied Intent:  denied Means:  denied Homicidal Ideation:  Plan:  denied Intent:  denied Means:  denied AEB (as evidenced by):  Psychiatric Specialty Exam: Review of Systems  Constitutional: Negative.   HENT: Negative.   Eyes: Negative.   Respiratory: Negative.   Cardiovascular: Negative.   Gastrointestinal: Negative.   Genitourinary: Negative.   Musculoskeletal: Positive for myalgias.  Skin: Negative.   Neurological: Negative.   Endo/Heme/Allergies: Negative.   Psychiatric/Behavioral: Positive for depression and hallucinations. The patient is nervous/anxious and has insomnia.        Labile and irritable and easily agitated    Blood pressure 136/77, pulse 85, temperature 98.4 F (36.9 C), temperature source Oral, resp. rate 16, height 5\' 1"  (1.549 m), weight 44.453 kg (98 lb), SpO2 100.00%.Body mass index is 18.53 kg/(m^2).  General Appearance: Disheveled  Eye Contact::  Poor  Speech:  Garbled and Pressured  Volume:  Decreased  Mood:  Dysphoric  Affect:  Blunt  Thought Process:   Disorganized  Orientation:  Only to person  Thought Content:  Hallucinations: Auditory  Suicidal Thoughts:  No  Homicidal Thoughts:  No  Memory:  Immediate;   Poor Recent;   Poor Remote;   Fair  Judgement:  Poor  Insight:  Lacking  Psychomotor Activity:  Decreased  Concentration:  Poor  Recall:  Poor  Akathisia:  No  Handed:  Right  AIMS (if indicated):     Assets:  Desire for Improvement Social Support  Sleep:  Number of Hours: 0   Current Medications: Current Facility-Administered Medications  Medication Dose Route Frequency Provider Last Rate Last Dose  . acetaminophen (TYLENOL) tablet 650 mg  650 mg Oral Q6H PRN Shuvon Rankin, NP   650 mg at 03/11/13 1124  . alum & mag hydroxide-simeth (MAALOX/MYLANTA) 200-200-20 MG/5ML suspension 30 mL  30 mL Oral Q4H PRN Shuvon Rankin, NP      . divalproex (DEPAKOTE SPRINKLE) capsule 1,000 mg  1,000 mg Oral QHS Dagny Fiorentino      . feeding supplement (RESOURCE BREEZE) liquid 1 Container  1 Container Oral TID BM Jeoffrey Massed, RD   1 Container at 03/13/13 1030  . ferrous sulfate tablet 325 mg  325 mg Oral Q breakfast Chelli Yerkes   325 mg at 03/13/13 0841  . fluticasone (FLONASE) 50 MCG/ACT nasal spray 2 spray  2 spray Each Nare Daily Shuvon Rankin, NP   2 spray at 03/10/13 7829  . hydrochlorothiazide (HYDRODIURIL) tablet 25 mg  25 mg Oral Daily Shuvon Rankin, NP   25 mg at 03/13/13 0840  .  irbesartan (AVAPRO) tablet 150 mg  150 mg Oral Daily Shuvon Rankin, NP   150 mg at 03/13/13 0841  . LORazepam (ATIVAN) tablet 0.5 mg  0.5 mg Oral BID Jaxsyn Catalfamo      . magnesium hydroxide (MILK OF MAGNESIA) suspension 30 mL  30 mL Oral Daily PRN Shuvon Rankin, NP      . risperiDONE (RISPERDAL) tablet 2 mg  2 mg Oral BID Cleotis Nipper, MD   2 mg at 03/13/13 0840  . traZODone (DESYREL) tablet 100 mg  100 mg Oral QHS PRN Mirranda Monrroy        Lab Results:  Results for orders placed during the hospital encounter of 03/03/13 (from the past 48  hour(s))  VALPROIC ACID LEVEL     Status: Abnormal   Collection Time    03/13/13  6:20 AM      Result Value Range   Valproic Acid Lvl <10.0 (*) 50.0 - 100.0 ug/mL    Physical Findings: AIMS: Facial and Oral Movements Muscles of Facial Expression: None, normal Lips and Perioral Area: None, normal Jaw: None, normal Tongue: None, normal,Extremity Movements Upper (arms, wrists, hands, fingers): None, normal Lower (legs, knees, ankles, toes): None, normal, Trunk Movements Neck, shoulders, hips: None, normal, Overall Severity Severity of abnormal movements (highest score from questions above): None, normal Incapacitation due to abnormal movements: None, normal Patient's awareness of abnormal movements (rate only patient's report): No Awareness, Dental Status Current problems with teeth and/or dentures?: No Does patient usually wear dentures?: Yes  CIWA:    COWS:     Treatment Plan Summary: Daily contact with patient to assess and evaluate symptoms and progress in treatment Medication management  Plan:1. Admit for crisis management and stabilization. 2. Medication management to reduce current symptoms to base line and improve the     patient's overall level of functioning 3. Treat health problems as indicated. 4. Develop treatment plan to decrease risk of relapse upon discharge and the need for     readmission. 5. Psycho-social education regarding relapse prevention and self care. 6. Health care follow up as needed for medical problems. 7. Increase Risperdal to 2 mg at twice a day.   8. Add Ativan 0.5mg  po BID for agitation 9. Follow up Depakote reports.  Medical Decision Making Problem Points:  Established problem, stable/improving (1), Review of last therapy session (1) and Review of psycho-social stressors (1) Data Points:  Order Aims Assessment (2) Review of medication regiment & side effects (2) Review of new medications or change in dosage (2)  I certify that inpatient  services furnished can reasonably be expected to improve the patient's condition.   Shaquanta Harkless,MD 03/13/2013, 10:45 AM

## 2013-03-13 NOTE — Progress Notes (Signed)
Pt has been in her room mostly this evening, but she did come to the dayroom for a little while which is an improvement for pt who has been isolating to her room.  She refused to drink the fruit flavored resource, but staff was able to get pt to drink a chocolate Ensure.  She refused to take the Depakote sprinkles, even when they were put in her ice cream.  She did take a Trazodone for sleep.  Pt seems to not mind taking small pills, but will not consider taking any large pills or capsules.  Pt has been in her room drawing pictures of staff to occupy her time.  Her conversation is mostly muffled, but some things she says can be understood.  She denies SI/HI/AV.  She is calmer this evening and directable.  Safety maintained with q15 minute checks.

## 2013-03-13 NOTE — Progress Notes (Signed)
Recreation Therapy Notes  Date: 05.26.2014       Time: 9:30am Location: [address] Dayroom       Group Topic/Focus: Self Expression  Participation Level: Did not attend  [employer], LRT/CTRS  Jearl Klinefelter 03/13/2013 2:27 PM

## 2013-03-14 MED ORDER — ENSURE COMPLETE PO LIQD
237.0000 mL | Freq: Two times a day (BID) | ORAL | Status: DC
  Administered 2013-03-14 – 2013-03-21 (×6): 237 mL via ORAL

## 2013-03-14 NOTE — Progress Notes (Signed)
Patient ID: Kaylee Kelley, female   DOB: [birth year], 67 y.o.   MRN: [id number] [facility] MD Progress Note  03/14/2013 1:35 PM Kaylee Kelley  MRN:  [id number]  Subjective: "I enjoy drawing. I slept pretty good."  Objective: Patient is calm this morning, able to discuss her drawing. She reports she is ok, but isn't eating much, she states she eats what she wants. Asks if her niece is coming to get her. Did discuss with her possible discharge home on Friday. Diagnosis:  Chronic Paranoid Schizophrenia  ADL's:  Intact  Sleep: Fair  Appetite:  Poor  Suicidal Ideation:  Plan:  denied Intent:  denied Means:  denied Homicidal Ideation:  Plan:  denied Intent:  denied Means:  denied AEB (as evidenced by): Observation, affect, patient's report of decreasing symptoms.  Psychiatric Specialty Exam: Review of Systems  Constitutional: Negative.   HENT: Negative.   Eyes: Negative.   Respiratory: Negative.   Cardiovascular: Negative.   Gastrointestinal: Negative.   Genitourinary: Negative.   Musculoskeletal: Positive for myalgias.  Skin: Negative.   Neurological: Negative.   Endo/Heme/Allergies: Negative.   Psychiatric/Behavioral: Positive for depression and hallucinations. The patient is nervous/anxious and has insomnia.        Labile and irritable and easily agitated    Blood pressure 122/75, pulse 84, temperature 99.7 F (37.6 C), temperature source Oral, resp. rate 18, height 5\' 1"  (1.549 m), weight 44.453 kg (98 lb), SpO2 100.00%.Body mass index is 18.53 kg/(m^2).  General Appearance: fairly groomed  Patent attorney::  Improving  Speech:  Garbled and Pressured  Volume:  normal  Mood:  Less labile, more calm  Affect: appropriate  Thought Process:  Improving clearing from Friday  Orientation:  Only to person  Thought Content:  Hallucinations: Auditory "I go to sleep."  Suicidal Thoughts:  No  Homicidal Thoughts:  No  Memory:  Immediate;   Poor Recent;   Poor Remote;   Fair  Judgement:   Poor  Insight:  Lacking  Psychomotor Activity:  normal  Concentration:  Poor  Recall:  Poor  Akathisia:  No  Handed:  Right  AIMS (if indicated):     Assets:  Desire for Improvement Social Support  Sleep:  Number of Hours: 5.25   Current Medications: Current Facility-Administered Medications  Medication Dose Route Frequency Provider Last Rate Last Dose  . acetaminophen (TYLENOL) tablet 650 mg  650 mg Oral Q6H PRN Shuvon Rankin, NP   650 mg at 03/11/13 1124  . alum & mag hydroxide-simeth (MAALOX/MYLANTA) 200-200-20 MG/5ML suspension 30 mL  30 mL Oral Q4H PRN Shuvon Rankin, NP      . divalproex (DEPAKOTE SPRINKLE) capsule 1,000 mg  1,000 mg Oral QHS Mojeed Akintayo      . feeding supplement (ENSURE COMPLETE) liquid 237 mL  237 mL Oral BID BM Verne Spurr, PA-C   237 mL at 03/14/13 1011  . ferrous sulfate tablet 325 mg  325 mg Oral Q breakfast Mojeed Akintayo   325 mg at 03/14/13 0830  . fluticasone (FLONASE) 50 MCG/ACT nasal spray 2 spray  2 spray Each Nare Daily Shuvon Rankin, NP   2 spray at 03/10/13 0865  . hydrochlorothiazide (HYDRODIURIL) tablet 25 mg  25 mg Oral Daily Shuvon Rankin, NP   25 mg at 03/14/13 0829  . irbesartan (AVAPRO) tablet 150 mg  150 mg Oral Daily Shuvon Rankin, NP   150 mg at 03/14/13 0830  . LORazepam (ATIVAN) tablet 0.5 mg  0.5 mg Oral BID Mojeed Akintayo   0.5  mg at 03/14/13 0830  . magnesium hydroxide (MILK OF MAGNESIA) suspension 30 mL  30 mL Oral Daily PRN Shuvon Rankin, NP      . risperiDONE (RISPERDAL) tablet 2 mg  2 mg Oral BID Cleotis Nipper, MD   2 mg at 03/14/13 0830  . traZODone (DESYREL) tablet 100 mg  100 mg Oral QHS PRN Mojeed Akintayo   100 mg at 03/13/13 2132    Lab Results:  Results for orders placed during the hospital encounter of 03/03/13 (from the past 48 hour(s))  VALPROIC ACID LEVEL     Status: Abnormal   Collection Time    03/13/13  6:20 AM      Result Value Range   Valproic Acid Lvl <10.0 (*) 50.0 - 100.0 ug/mL    Physical  Findings: AIMS: Facial and Oral Movements Muscles of Facial Expression: None, normal Lips and Perioral Area: None, normal Jaw: None, normal Tongue: None, normal,Extremity Movements Upper (arms, wrists, hands, fingers): None, normal Lower (legs, knees, ankles, toes): None, normal, Trunk Movements Neck, shoulders, hips: None, normal, Overall Severity Severity of abnormal movements (highest score from questions above): None, normal Incapacitation due to abnormal movements: None, normal Patient's awareness of abnormal movements (rate only patient's report): No Awareness, Dental Status Current problems with teeth and/or dentures?: No Does patient usually wear dentures?: Yes  CIWA:    COWS:     Treatment Plan Summary: Daily contact with patient to assess and evaluate symptoms and progress in treatment Medication management  Plan:1. Admit for crisis management and stabilization. 2. Medication management to reduce current symptoms to base line and improve the patient's overall level of functioning 3. Treat health problems as indicated. 4. Develop treatment plan to decrease risk of relapse upon discharge and the need for readmission. 5. Psycho-social education regarding relapse prevention and self care. 6. Health care follow up as needed for medical problems. 7. Increase Risperdal to 2 mg at twice a day.   8. Continue Ativan 0.5mg  po BID for agitation. This has clearly helped a great deal. 9. Will order depakote level for in the morning. 10. ELOS: Plan to discharge out on Friday if continues to improve. Medical Decision Making Problem Points:  Established problem, stable/improving (1), Review of last therapy session (1) and Review of psycho-social stressors (1) Data Points:  Order Aims Assessment (2) Review of medication regiment & side effects (2) Review of new medications or change in dosage (2)  I certify that inpatient services furnished can reasonably be expected to improve the  patient's condition.  Rona Ravens. Aubrianne Molyneux RPAC 1:42 PM 03/14/2013

## 2013-03-14 NOTE — Progress Notes (Signed)
Pt has been out of her room more today per staff.  On initial contact with pt this evening, she was in bed awake with the covers pulled up to her head.  Pt appeared to be in no distress.  She voiced no needs or concerns.  She denies SI/HI.  She says she hears voices sometimes.  She has been refusing the Depakote sprinkles because the capsules are too difficult to swallow, so tonight the medication was again put into ice cream.  Tonight she took the medication without incident.  Pt even came up to the window to take Trazodone for sleep.  Pt has been pleasant and appropriate with staff and peers.  Support and encouragement offered.  Safety maintained with q15 minute checks.

## 2013-03-14 NOTE — Progress Notes (Signed)
D: Pt denies SI. Per reports hearing voices "somtimes, but I go to sleep". Pt calm this morning on approach. Pt compliant with taking meds and talking with Clinical research associate during shift assessment. Pt mood is up and down but pt is less anxious today compared to yesterday morning. Around 10:00 am writer approached pt to administer  ensure as ordered. Pt was in the bathroom at the time, pt started crying out and yelling "leave me alone, I just want to be alone". Writer tried to comfort pt but pt just became more agitated. Writer stepped out the pt bathroom and pt seemed to calm down once writer left out. A: Medications administered as ordered per MD. Verbal support given. Assistants with ADLs as needed. One to one support with pt by Clinical research associate and MHT.  Pt reoriented to time and place by Clinical research associate. Pt continues to need redirecting by staff d/t inappropriate behaviors. R: Pt presents with loud hyperverbal speech. Pt can be verbally aggressive at times.

## 2013-03-15 NOTE — Progress Notes (Signed)
Patient ID: Kaylee Kelley, female   DOB: Aug 10, 1946, 67 y.o.   MRN: [id number] [facility] MD Progress Note  03/15/2013 11:49 AM Kaylee Kelley  MRN:  [id number]  Subjective: Patient was resting in bed with eyes closed and did not want to respond verbally. She would shake her head yes or no.  Objective: Patient has been seen up and active on the unit this morning, speaking appropriately with staff and taking her medication. She does not like the Depakote sprinkles.  Diagnosis:  Chronic Paranoid Schizophrenia  ADL's:  Intact  Sleep: Fair  Appetite:  fair  Suicidal Ideation:  Plan:  denied Intent:  denied Means:  denied Homicidal Ideation:  Plan:  denied Intent:  denied Means:  denied AEB (as evidenced by): Observation, affect, patient's report of decreasing symptoms.  Psychiatric Specialty Exam: Review of Systems  Constitutional: Negative.   HENT: Negative.   Eyes: Negative.   Respiratory: Negative.   Cardiovascular: Negative.   Gastrointestinal: Negative.   Genitourinary: Negative.   Musculoskeletal: Positive for myalgias.  Skin: Negative.   Neurological: Negative.   Endo/Heme/Allergies: Negative.   Psychiatric/Behavioral: Positive for depression and hallucinations. The patient is nervous/anxious and has insomnia.        Labile and irritable and easily agitated    Blood pressure 123/66, pulse 76, temperature 96.8 F (36 C), temperature source Oral, resp. rate 18, height 5\' 1"  (1.549 m), weight 45.133 kg (99 lb 8 oz), SpO2 100.00%.Body mass index is 18.81 kg/(m^2).  General Appearance: fairly groomed  Patent attorney::  closed  Speech:  None only nods yes and & no today  Volume:    Mood:  more calm  Affect: appropriate  Thought Process:  UTA  Orientation:  Only to person  Thought Content: UTA  Suicidal Thoughts:    Homicidal Thoughts:  UTA  Memory:  Immediate;   Poor Recent;   Poor Remote;   Fair  Judgement:  Poor  Insight:  Lacking  Psychomotor Activity:  normal   Concentration:  Poor  Recall:  Poor  Akathisia:  No  Handed:  Right  AIMS (if indicated):     Assets:  Desire for Improvement Social Support  Sleep:  Number of Hours: 4   Current Medications: Current Facility-Administered Medications  Medication Dose Route Frequency Provider Last Rate Last Dose  . acetaminophen (TYLENOL) tablet 650 mg  650 mg Oral Q6H PRN Shuvon Rankin, NP   650 mg at 03/11/13 1124  . alum & mag hydroxide-simeth (MAALOX/MYLANTA) 200-200-20 MG/5ML suspension 30 mL  30 mL Oral Q4H PRN Shuvon Rankin, NP      . divalproex (DEPAKOTE SPRINKLE) capsule 1,000 mg  1,000 mg Oral QHS Mojeed Akintayo      . feeding supplement (ENSURE COMPLETE) liquid 237 mL  237 mL Oral BID BM Verne Spurr, PA-C   237 mL at 03/14/13 1011  . ferrous sulfate tablet 325 mg  325 mg Oral Q breakfast Mojeed Akintayo   325 mg at 03/14/13 0830  . fluticasone (FLONASE) 50 MCG/ACT nasal spray 2 spray  2 spray Each Nare Daily Shuvon Rankin, NP   2 spray at 03/10/13 7829  . hydrochlorothiazide (HYDRODIURIL) tablet 25 mg  25 mg Oral Daily Shuvon Rankin, NP   25 mg at 03/14/13 0829  . irbesartan (AVAPRO) tablet 150 mg  150 mg Oral Daily Shuvon Rankin, NP   150 mg at 03/14/13 0830  . LORazepam (ATIVAN) tablet 0.5 mg  0.5 mg Oral BID Mojeed Akintayo   0.5 mg at 03/14/13 0830  .  magnesium hydroxide (MILK OF MAGNESIA) suspension 30 mL  30 mL Oral Daily PRN Shuvon Rankin, NP      . risperiDONE (RISPERDAL) tablet 2 mg  2 mg Oral BID Cleotis Nipper, MD   2 mg at 03/14/13 0830  . traZODone (DESYREL) tablet 100 mg  100 mg Oral QHS PRN Mojeed Akintayo   100 mg at 03/13/13 2132    Lab Results:  Results for orders placed during the hospital encounter of 03/03/13 (from the past 48 hour(s))  VALPROIC ACID LEVEL     Status: Abnormal   Collection Time    03/13/13  6:20 AM      Result Value Range   Valproic Acid Lvl <10.0 (*) 50.0 - 100.0 ug/mL    Physical Findings: AIMS: Facial and Oral Movements Muscles of Facial  Expression: None, normal Lips and Perioral Area: None, normal Jaw: None, normal Tongue: None, normal,Extremity Movements Upper (arms, wrists, hands, fingers): None, normal Lower (legs, knees, ankles, toes): None, normal, Trunk Movements Neck, shoulders, hips: None, normal, Overall Severity Severity of abnormal movements (highest score from questions above): None, normal Incapacitation due to abnormal movements: None, normal Patient's awareness of abnormal movements (rate only patient's report): No Awareness, Dental Status Current problems with teeth and/or dentures?: No Does patient usually wear dentures?: Yes  CIWA:    COWS:     Treatment Plan Summary: Daily contact with patient to assess and evaluate symptoms and progress in treatment Medication management  Plan: 1. Continue crisis management and stabilization. 2. Medication management to reduce current symptoms to base line and improve the patient's overall level of functioning 3. Treat health problems as indicated. 4. Develop treatment plan to decrease risk of relapse upon discharge and the need for readmission. 5. Psycho-social education regarding relapse prevention and self care. 6. Health care follow up as needed for medical problems. 7. Increase Risperdal to 2 mg at twice a day.   8. Continue Ativan 0.5mg  po BID for agitation. This has clearly helped a great deal. 9. depakote level for in the morning patient declined labs. 10. ELOS: Plan to discharge out on Friday if continues to improve. Medical Decision Making Problem Points:  Established problem, stable/improving (1), Review of last therapy session (1) and Review of psycho-social stressors (1) Data Points:  Order Aims Assessment (2) Review of medication regiment & side effects (2) Review of new medications or change in dosage (2)  I certify that inpatient services furnished can reasonably be expected to improve the patient's condition.  Rona Ravens. Rosalynd Mcwright RPAC 11:49  AM 03/15/2013

## 2013-03-15 NOTE — Progress Notes (Signed)
Recreation Therapy Notes  Date: 05.28.2014 Time: 9:30am Location: [address] Dayroom  Group Topic/Focus: Problem Solving  Participation Level:  Did not attend  [employer], LRT/CTRS  Jearl Klinefelter 03/15/2013 10:17 AM

## 2013-03-15 NOTE — Tx Team (Signed)
  Interdisciplinary Treatment Plan Update   Date Reviewed:  03/15/2013  Time Reviewed:  8:06 AM  Progress in Treatment:   Attending groups: No  Stays in room Participating in groups: No Taking medication as prescribed: Generally yes Tolerating medication: Yes Family/Significant other contact made: Yes  Patient understands diagnosis: Yes  Discussing patient identified problems/goals with staff: Yes Medical problems stabilized or resolved: Yes Denies suicidal/homicidal ideation: Yes  In tx team Patient has not harmed self or others: Yes  For review of initial/current patient goals, please see plan of care.  Estimated Length of Stay:  2-3 days  Reason for Continuation of Hospitalization: Hallucinations Medication stabilization Other; describe Agitation  New Problems/Goals identified:  N/A  Discharge Plan or Barriers:   return home, follow up outpt  Additional Comments:  Pt is generally compliant with meds.  She still becomes agitated and angry at times.  Benzo started 2 days ago to address this.  Hallucinations appear to be less bothersome.    Attendees:  Signature: Thedore Mins, MD 03/15/2013 8:06 AM   Signature: Richelle Ito, LCSW 03/15/2013 8:06 AM  Signature: Verne Spurr, PA 03/15/2013 8:06 AM  Signature:  03/15/2013 8:06 AM  Signature: Liborio Nixon, RN 03/15/2013 8:06 AM  Signature:  03/15/2013 8:06 AM  Signature:   03/15/2013 8:06 AM  Signature:    Signature:    Signature:    Signature:    Signature:    Signature:      Scribe for Treatment Team:   Richelle Ito, LCSW  03/15/2013 8:06 AM

## 2013-03-15 NOTE — BHH Group Notes (Signed)
[facility] LCSW Aftercare Discharge Planning Group Note   03/15/2013 8:12 AM  Participation Quality:  Did not attend.  Called niece and alerted her to plan of d/c on Friday.  She plans to visit tonight, and will plan to pick up at 1:00 on Friday.    Daryel Gerald B

## 2013-03-15 NOTE — Progress Notes (Signed)
Pt has been in her room most of the evening.  She did not come out for group like she did last night.  She has been more agitated this evening and not as receptive to this RN as she was yesterday.  She is still suspicious of staff.  She has not eaten the ice cream with the Depakote sprinkles yet tonight, but will continue to offer it to her until 2 or 3 AM as long as she is awake.  Pt denies any thoughts to hurt herself or others.  She denies AVH.  Earlier in the shift, this Clinical research associate observed pt walking in her room.  She was limping as if her feet were hurting.  Asked pt if her feet were sore and if she would like something for pain.  She began ranting loudly about not touching her feet and she got into bed and covered them up.  Offered a heat pack, but pt refused.  Support and encouragement offered.  Safety maintained with q15 minute checks.

## 2013-03-15 NOTE — Progress Notes (Signed)
D: Pt denies SI. Pt reports hearing voices. Pt continues to talk to her self but is making progress with controlling her mood. Pt mood is stable this morning. Pt was able to communicate with writer this morning appropriately. Pt was able to eat breakfast this morning and compliant with taking meds. A: Medications administered as ordered per MD. Verbal support given. Pt reoriented as needed by staff. Assistants with ADLs as needed. 15 minute checks performed for safety. R: Pt less anxious this morning

## 2013-03-15 NOTE — Clinical Social Work Note (Signed)
[facility] [employer] Group Therapy  03/15/2013 , 1:29 PM    Type of Therapy:  [employer] Presentation  Participation Level:  Did not attend    Summary of Progress/Problems:  Onalee Hua from [employer] came to present his recovery story and play the guitar.    Daryel Gerald B 03/15/2013 , 1:29 PM

## 2013-03-16 MED ORDER — LORAZEPAM 0.5 MG PO TABS
0.5000 mg | ORAL_TABLET | Freq: Two times a day (BID) | ORAL | Status: DC
  Administered 2013-03-16 – 2013-03-17 (×2): 0.5 mg via ORAL
  Filled 2013-03-16 (×2): qty 1

## 2013-03-16 MED ORDER — RISPERIDONE 2 MG PO TABS
2.0000 mg | ORAL_TABLET | Freq: Two times a day (BID) | ORAL | Status: DC
  Administered 2013-03-16 – 2013-03-17 (×2): 2 mg via ORAL
  Filled 2013-03-16 (×4): qty 1

## 2013-03-16 NOTE — Progress Notes (Signed)
Pt came to the nurse's station asking for ginger ale and ice cream.  Pt was given the ice cream with the Depakote sprinkles along with ginger ale.  MHT reports that pt did eat most of the ice cream.  Continue to monitor with q15 minute checks.

## 2013-03-16 NOTE — Progress Notes (Signed)
D:  Approached patient this evening with suppertime medications.  She continually chanted something unintelligible and would not take the medicines.  She started yelling at me and told me that she took medicine two times a day, not three.  I then attempted to pick up the meal trays that have been sitting in the room all day.  She ripped them out of my hands and the food went all over the floor and bed.  She told me to get out and to close the door.  A:  Medications rescheduled to bedtime per her request.  Attempted to explain to patient that we could not allow her to keep meat and eggs in her room all day.   R:  Patient angry and verbally abusive.  Would not allow food to be removed from the room at this time.  Will attempt later.

## 2013-03-16 NOTE — Clinical Social Work Note (Signed)
[facility] Group Notes:  (Counselor/Nursing/MHT/Case Management/Adjunct)  09/01/2012 2:20 PM  Type of Therapy:  Group Therapy  Participation Level:  Did Not Attend    Summary of Progress/Problems: .balance: The topic for group was balance in life.  Pt participated in the discussion about when their life was in balance and out of balance and how this feels.  Pt discussed ways to get back in balance and short term goals they can work on to get where they want to be.    Daryel Gerald B 09/01/2012, 2:20 PM

## 2013-03-16 NOTE — Progress Notes (Signed)
D:  Patient has been in the bed most of the day.  Minimal interaction with staff.  She did not want to talk to me today and asked me to leave the room and close the door.   A:  Medications given per schedule.   R:  Pleasant and cooperative with staff.  Tolerating medications.  Not interacting with peers today.  Safety is maintained.

## 2013-03-16 NOTE — [facility] Group Notes (Signed)
[facility] LCSW Aftercare Discharge Planning Group Note   03/16/2013 10:35 AM  Participation Quality:  Did not attend    [place]

## 2013-03-16 NOTE — Progress Notes (Signed)
D   Pt is irritable when staff come into the room   She has remained in her room most of the shift talking to people who are not there  When this staff person entered the room she began talking rapidly and incoherently   She at first refused to take her medications but then did take them   She continued to say she didn't need medications and pulled out a piece of candy and said this is what i need and then showed me a bottle of shampoo   She would not let staff remove the trays from her room A   Verbal support and redirection given  Attempted to orient to reality   Medications administered and effectiveness monitored   Q 15 min checks R   Pt safe at present and continues to hallucinate and continues with confusion

## 2013-03-16 NOTE — Progress Notes (Signed)
Patient ID: Kaylee Kelley, female   DOB: 12-26-1945, 66 y.o.   MRN: [id number] [facility] MD Progress Note  03/15/2013 11:49 AM Kaylee Kelley  MRN:  [id number]  Subjective: "It's too cold in here," and "close the door!" "Like what?"  Objective: Patient has been seen up and active on the unit this morning, speaking appropriately with staff and taking her medication. She did not want to talk in the DayRoom as it was too chilly, and went to her room and covered up with blankets and pulled the blanket over her head. She did answer appropriately to my questions.  Diagnosis:  Chronic Paranoid Schizophrenia  ADL's:  Intact  Sleep: Fair  Appetite:  fair  Suicidal Ideation:  Plan:  denied Intent:  denied Means:  denied Homicidal Ideation:  Plan:  denied Intent:  denied Means:  denied AEB (as evidenced by): Observation, affect, patient's report of decreasing symptoms.  Psychiatric Specialty Exam: Review of Systems  Constitutional: Negative.   HENT: Negative.   Eyes: Negative.   Respiratory: Negative.   Cardiovascular: Negative.   Gastrointestinal: Negative.   Genitourinary: Negative.   Musculoskeletal: Positive for myalgias.  Skin: Negative.   Neurological: Negative.   Endo/Heme/Allergies: Negative.   Psychiatric/Behavioral: Positive for depression and hallucinations. The patient is nervous/anxious and has insomnia.        Labile and irritable and easily agitated    Blood pressure 123/66, pulse 76, temperature 96.8 F (36 C), temperature source Oral, resp. rate 18, height 5\' 1"  (1.549 m), weight 45.133 kg (99 lb 8 oz), SpO2 100.00%.Body mass index is 18.81 kg/(m^2).  General Appearance: fairly groomed  Patent attorney::  closed  Speech:  Mono sylabic today  Volume:  normal  Mood:  more calm  Affect: appropriate  Thought Process:  disorganized  Orientation:  Only to person  Thought Content: UTA  Suicidal Thoughts:  denies  Homicidal Thoughts:  denies  Memory:  Immediate;    Poor Recent;   Poor Remote;   Fair  Judgement:  Poor  Insight:  Lacking  Psychomotor Activity:  normal  Concentration:  Poor  Recall:  Poor  Akathisia:  No  Handed:  Right  AIMS (if indicated):     Assets:  Desire for Improvement Social Support  Sleep:  Number of Hours: 1   Current Medications: Current Facility-Administered Medications  Medication Dose Route Frequency Provider Last Rate Last Dose  . acetaminophen (TYLENOL) tablet 650 mg  650 mg Oral Q6H PRN Shuvon Rankin, NP   650 mg at 03/11/13 1124  . alum & mag hydroxide-simeth (MAALOX/MYLANTA) 200-200-20 MG/5ML suspension 30 mL  30 mL Oral Q4H PRN Shuvon Rankin, NP      . divalproex (DEPAKOTE SPRINKLE) capsule 1,000 mg  1,000 mg Oral QHS Mojeed Akintayo      . feeding supplement (ENSURE COMPLETE) liquid 237 mL  237 mL Oral BID BM Verne Spurr, PA-C   237 mL at 03/14/13 1011  . ferrous sulfate tablet 325 mg  325 mg Oral Q breakfast Mojeed Akintayo   325 mg at 03/14/13 0830  . fluticasone (FLONASE) 50 MCG/ACT nasal spray 2 spray  2 spray Each Nare Daily Shuvon Rankin, NP   2 spray at 03/10/13 7829  . hydrochlorothiazide (HYDRODIURIL) tablet 25 mg  25 mg Oral Daily Shuvon Rankin, NP   25 mg at 03/14/13 0829  . irbesartan (AVAPRO) tablet 150 mg  150 mg Oral Daily Shuvon Rankin, NP   150 mg at 03/14/13 0830  . LORazepam (ATIVAN) tablet 0.5 mg  0.5 mg Oral BID Mojeed Akintayo   0.5 mg at 03/14/13 0830  . magnesium hydroxide (MILK OF MAGNESIA) suspension 30 mL  30 mL Oral Daily PRN Shuvon Rankin, NP      . risperiDONE (RISPERDAL) tablet 2 mg  2 mg Oral BID Cleotis Nipper, MD   2 mg at 03/14/13 0830  . traZODone (DESYREL) tablet 100 mg  100 mg Oral QHS PRN Mojeed Akintayo   100 mg at 03/13/13 2132    Lab Results:  Results for orders placed during the hospital encounter of 03/03/13 (from the past 48 hour(s))  VALPROIC ACID LEVEL     Status: Abnormal   Collection Time    03/13/13  6:20 AM      Result Value Range   Valproic Acid Lvl  <10.0 (*) 50.0 - 100.0 ug/mL    Physical Findings: AIMS: Facial and Oral Movements Muscles of Facial Expression: None, normal Lips and Perioral Area: None, normal Jaw: None, normal Tongue: None, normal,Extremity Movements Upper (arms, wrists, hands, fingers): None, normal Lower (legs, knees, ankles, toes): None, normal, Trunk Movements Neck, shoulders, hips: None, normal, Overall Severity Severity of abnormal movements (highest score from questions above): None, normal Incapacitation due to abnormal movements: None, normal Patient's awareness of abnormal movements (rate only patient's report): No Awareness, Dental Status Current problems with teeth and/or dentures?: No Does patient usually wear dentures?: Yes  CIWA:    COWS:     Treatment Plan Summary: Daily contact with patient to assess and evaluate symptoms and progress in treatment Medication management  Plan: 1. Continue crisis management and stabilization. 2. Medication management to reduce current symptoms to base line and improve the patient's overall level of functioning 3. Treat health problems as indicated. 4. Develop treatment plan to decrease risk of relapse upon discharge and the need for readmission. 5. Psycho-social education regarding relapse prevention and self care. 6. Health care follow up as needed for medical problems. 7. Continue Risperdal to 2 mg at twice a day.   8. Continue Ativan 0.5mg  po BID for agitation. This has clearly helped a great deal. 9. depakote level for in the morning patient declined labs. 10. ELOS: Plan to discharge out on Friday if continues to improve. Medical Decision Making Problem Points:  Established problem, stable/improving (1), Review of last therapy session (1) and Review of psycho-social stressors (1) Data Points:  Order Aims Assessment (2) Review of medication regiment & side effects (2) Review of new medications or change in dosage (2)  I certify that inpatient services  furnished can reasonably be expected to improve the patient's condition.  Rona Ravens. Martin Smeal RPAC 11:49 AM 03/15/2013

## 2013-03-17 MED ORDER — LORAZEPAM 1 MG PO TABS
1.0000 mg | ORAL_TABLET | Freq: Two times a day (BID) | ORAL | Status: DC
  Administered 2013-03-17 – 2013-03-20 (×6): 1 mg via ORAL
  Filled 2013-03-17 (×6): qty 1

## 2013-03-17 MED ORDER — RISPERIDONE 3 MG PO TABS
3.0000 mg | ORAL_TABLET | Freq: Two times a day (BID) | ORAL | Status: DC
  Administered 2013-03-17 – 2013-03-21 (×7): 3 mg via ORAL
  Filled 2013-03-17 (×5): qty 1
  Filled 2013-03-17: qty 6
  Filled 2013-03-17 (×5): qty 1
  Filled 2013-03-17: qty 6

## 2013-03-17 NOTE — Tx Team (Signed)
  Interdisciplinary Treatment Plan Update   Date Reviewed:  03/17/2013  Time Reviewed:  8:15 AM  Progress in Treatment:   Attending groups: Never Participating in groups: No Taking medication as prescribed: Yes  Tolerating medication: Yes Family/Significant other contact made: Yes  Patient understands diagnosis: No  Discussing patient identified problems/goals with staff: Yes Medical problems stabilized or resolved: Yes Denies suicidal/homicidal ideation: Yes  In tx team Patient has not harmed self or others: Yes  For review of initial/current patient goals, please see plan of care.  Estimated Length of Stay:  3-5 days  Reason for Continuation of Hospitalization: Delusions  Hallucinations Medication stabilization Other; describe Agitation  New Problems/Goals identified:  N/A  Discharge Plan or Barriers:   return home, follow up outpt  Additional Comments:  Kaylee Kelley continues to exhibit the above symptoms.   Not yet at a place that she can safely be d/ced home.  Attendees:  Signature: Thedore Mins, MD 03/17/2013 8:15 AM   Signature: Richelle Ito, LCSW 03/17/2013 8:15 AM  Signature: Verne Spurr, PA 03/17/2013 8:15 AM  Signature: Isaac Laud, RN 03/17/2013 8:15 AM  Signature:  03/17/2013 8:15 AM  Signature:  03/17/2013 8:15 AM  Signature:   03/17/2013 8:15 AM  Signature:    Signature:    Signature:    Signature:    Signature:    Signature:      Scribe for Treatment Team:   Richelle Ito, LCSW  03/17/2013 8:15 AM

## 2013-03-17 NOTE — Progress Notes (Signed)
Recreation Therapy Notes  Date: 05.30.2014 Time: 9:30am Location: [address] Dayroom      Group Topic/Focus: Communication  Participation Level: Did not attend.  Marykay Lex Nikolaos Maddocks, LRT/CTRS  Leshia Kope L 03/17/2013 2:40 PM

## 2013-03-17 NOTE — Progress Notes (Signed)
Adult Psychoeducational Group Note  Date:  03/17/2013 Time:  8:00PM Group Topic/Focus:  Wrap-Up Group:   The focus of this group is to help patients review their daily goal of treatment and discuss progress on daily workbooks.  Participation Level:  Did Not Attend   Additional Comments: Pt. Didn't attend group.   Bing Plume D 03/17/2013, 8:55 PM

## 2013-03-17 NOTE — Progress Notes (Signed)
Patient ID: Kaylee Kelley, female   DOB: [birth year], 67 y.o.   MRN: [id number] 03-17-13 @ 0944 nursing shift  D: pt is resistant to taking her medications, but with encouragement she will take them. She is unable to fill out her inventory sheet. She denies any si/hi. She continues to be labile, anxious and bizarre. A: staff continues to support and encourage her. She is taking her meals in her room. She is more cooperative with staff than in previous encounters with her. Communication is very difficult with this pt due to not having teeth as well as her muttering. RN will monitor and Q 15 min ck's continue.

## 2013-03-17 NOTE — Progress Notes (Signed)
Patient ID: Kaylee Kelley, female   DOB: 1946-01-02, 67 y.o.   MRN: [id number] [facility] MD Progress Note  03/17/2013 10:11 AM Kaylee Kelley  MRN:  [id number]  Subjective: "I not talking to you, I don't want anything from you". Objective: Patient remains irritable and easily agitated. He refused to acknowledge my presence this morning. She was talking to herself and rambling about people who are out to get her and  thought process remains disorganized.  Staffs reports that she did not sleep well last, she socially withdrawn and isolates herself in her room with minimal interactions with peers. Patient is compliant with her medication and denies any side effects.    Diagnosis:  Chronic Paranoid Schizophrenia   ADL's:  Intact  Sleep: poor  Appetite:  Poor  Suicidal Ideation:  Plan:  denied Intent:  denied Means:  denied Homicidal Ideation:  Plan:  denied Intent:  denied Means:  denied AEB (as evidenced by):  Psychiatric Specialty Exam: Review of Systems  Constitutional: Negative.   HENT: Negative.   Eyes: Negative.   Respiratory: Negative.   Cardiovascular: Negative.   Gastrointestinal: Negative.   Genitourinary: Negative.   Musculoskeletal: Positive for myalgias.  Skin: Negative.   Neurological: Negative.   Endo/Heme/Allergies: Negative.   Psychiatric/Behavioral: Positive for depression and hallucinations. The patient is nervous/anxious and has insomnia.        Labile and irritable and easily agitated    Blood pressure 120/69, pulse 69, temperature 97.3 F (36.3 C), temperature source Oral, resp. rate 16, height 5\' 1"  (1.549 m), weight 45.133 kg (99 lb 8 oz), SpO2 100.00%.Body mass index is 18.81 kg/(m^2).  General Appearance: Disheveled  Eye Contact::  Poor  Speech:  Garbled and Pressured  Volume:  Decreased  Mood:  Dysphoric  Affect:  Blunt  Thought Process:  Disorganized  Orientation:  Only to person  Thought Content:  Hallucinations: Auditory  Suicidal Thoughts:  No   Homicidal Thoughts:  No  Memory:  Immediate;   Poor Recent;   Poor Remote;   Fair  Judgement:  Poor  Insight:  Lacking  Psychomotor Activity:  Decreased  Concentration:  Poor  Recall:  Poor  Akathisia:  No  Handed:  Right  AIMS (if indicated):     Assets:  Desire for Improvement Social Support  Sleep:  Number of Hours: 1.25   Current Medications: Current Facility-Administered Medications  Medication Dose Route Frequency Provider Last Rate Last Dose  . acetaminophen (TYLENOL) tablet 650 mg  650 mg Oral Q6H PRN Shuvon Rankin, NP   650 mg at 03/11/13 1124  . alum & mag hydroxide-simeth (MAALOX/MYLANTA) 200-200-20 MG/5ML suspension 30 mL  30 mL Oral Q4H PRN Shuvon Rankin, NP      . divalproex (DEPAKOTE SPRINKLE) capsule 1,000 mg  1,000 mg Oral QHS Wetzel Meester   1,000 mg at 03/16/13 2124  . feeding supplement (ENSURE COMPLETE) liquid 237 mL  237 mL Oral BID BM Verne Spurr, PA-C   237 mL at 03/16/13 0956  . ferrous sulfate tablet 325 mg  325 mg Oral Q breakfast Cherrie Franca   325 mg at 03/16/13 0831  . fluticasone (FLONASE) 50 MCG/ACT nasal spray 2 spray  2 spray Each Nare Daily Shuvon Rankin, NP   2 spray at 03/10/13 1308  . hydrochlorothiazide (HYDRODIURIL) tablet 25 mg  25 mg Oral Daily Shuvon Rankin, NP   25 mg at 03/17/13 0807  . irbesartan (AVAPRO) tablet 150 mg  150 mg Oral Daily Shuvon Rankin, NP   150  mg at 03/17/13 0807  . LORazepam (ATIVAN) tablet 0.5 mg  0.5 mg Oral BID Verne Spurr, PA-C   0.5 mg at 03/17/13 0750  . magnesium hydroxide (MILK OF MAGNESIA) suspension 30 mL  30 mL Oral Daily PRN Shuvon Rankin, NP      . risperiDONE (RISPERDAL) tablet 2 mg  2 mg Oral BID Verne Spurr, PA-C   2 mg at 03/17/13 0750  . traZODone (DESYREL) tablet 100 mg  100 mg Oral QHS PRN Gianny Sabino   100 mg at 03/15/13 2203    Lab Results:  No results found for this or any previous visit (from the past 48 hour(s)).  Physical Findings: AIMS: Facial and Oral Movements Muscles  of Facial Expression: None, normal Lips and Perioral Area: None, normal Jaw: None, normal Tongue: None, normal,Extremity Movements Upper (arms, wrists, hands, fingers): None, normal Lower (legs, knees, ankles, toes): None, normal, Trunk Movements Neck, shoulders, hips: None, normal, Overall Severity Severity of abnormal movements (highest score from questions above): None, normal Incapacitation due to abnormal movements: None, normal Patient's awareness of abnormal movements (rate only patient's report): No Awareness, Dental Status Current problems with teeth and/or dentures?: No Does patient usually wear dentures?: Yes  CIWA:    COWS:     Treatment Plan Summary: Daily contact with patient to assess and evaluate symptoms and progress in treatment Medication management  Plan:1. Admit for crisis management and stabilization. 2. Medication management to reduce current symptoms to base line and improve the     patient's overall level of functioning 3. Treat health problems as indicated. 4. Develop treatment plan to decrease risk of relapse upon discharge and the need for     readmission. 5. Psycho-social education regarding relapse prevention and self care. 6. Health care follow up as needed for medical problems. 7. Increase Risperdal to 3 mg at twice a day for delusion and psychosis 8. Increase Ativan 1mg  po BID for agitation   Medical Decision Making Problem Points:  Established problem, stable/improving (1), Review of last therapy session (1) and Review of psycho-social stressors (1) Data Points:  Order Aims Assessment (2) Review of medication regiment & side effects (2) Review of new medications or change in dosage (2)  I certify that inpatient services furnished can reasonably be expected to improve the patient's condition.   Andrian Urbach,MD 03/17/2013, 10:11 AM

## 2013-03-17 NOTE — [facility] Suicide Risk Assessment (Signed)
Suicide Risk Assessment  Discharge Assessment     Demographic Factors:  Low socioeconomic status, Unemployed and female  Mental Status Per Nursing Assessment::   On Admission:  NA  Current Mental Status by Physician: patient denies suicidal ideation, intent or plan  Loss Factors: Decrease in vocational status and Financial problems/change in socioeconomic status  Historical Factors: Impulsivity  Risk Reduction Factors:   Sense of responsibility to family, Living with another person, especially a relative and Positive social support  Continued Clinical Symptoms:  Schizophrenia:   Paranoid or undifferentiated type  Cognitive Features That Contribute To Risk:  Closed-mindedness Polarized thinking    Suicide Risk:  Minimal: No identifiable suicidal ideation.  Patients presenting with no risk factors but with morbid ruminations; may be classified as minimal risk based on the severity of the depressive symptoms  Discharge Diagnoses:   AXIS I:  Schizophrenia, paranoid type  AXIS II:  Deferred AXIS III:   Past Medical History  Diagnosis Date  . Hypertension   . Pre-diabetes    AXIS IV:  other psychosocial or environmental problems and problems related to social environment AXIS V:  61-70 mild symptoms  Plan Of Care/Follow-up recommendations:  Activity:  as tolerated Diet:  healthy Tests:  routine Other:  patient to keep her after care appointment  Is patient on multiple antipsychotic therapies at discharge:  No   Has Patient had three or more failed trials of antipsychotic monotherapy by history:  No  Recommended Plan for Multiple Antipsychotic Therapies: N/A  Kaylee Hitsman,MD 03/17/2013, 9:42 AM

## 2013-03-18 MED ORDER — VALPROIC ACID 250 MG/5ML PO SYRP
1000.0000 mg | ORAL_SOLUTION | Freq: Every day | ORAL | Status: DC
  Administered 2013-03-18 – 2013-03-19 (×2): 1000 mg via ORAL
  Filled 2013-03-18 (×5): qty 20
  Filled 2013-03-18: qty 60

## 2013-03-18 NOTE — Progress Notes (Addendum)
D: Pt denies SI/HI/AVH.  However, pt does show visible signs of responding to internal stimuli.  Pt refuses to take her scheduled medication.  Pt did attend 1500 group.  Pt remains on unit for meal times and eats in her room.  A: Support and encouragement offered to pt.  Q15 min checks continued for pt safety. R:  Pt not receptive.  Pt remains safe on the unit.

## 2013-03-18 NOTE — Progress Notes (Signed)
Patient ID: Kaylee Kelley, female   DOB: 04/26/1946, 67 y.o.   MRN: [id number] [facility] MD Progress Note  03/18/2013 12:58 PM Kaylee Kelley  MRN:  [id number]  Subjective: "Leave me alone". Objective: Patient seen and chart reviewed .  Patient remains very labile irritable and agitated.  She refused to talk.  She covered her face with a bedsheet and did not participate in any conversation.  Her medications were increased yesterday.  She is now taking Risperdal 3 mg twice a day.  She is also taking Depakote sprinkles .   She is resistant sometime for medication compliance.  She gets easily agitated and then start rambling.  She was noticed talking to herself and rambling.  Her thought processes remains disorganized. she does not participate in any groups.  She remains withdrawn and isolated.      Diagnosis:  Chronic Paranoid Schizophrenia   ADL's:  Intact  Sleep: poor  Appetite:  Poor  Suicidal Ideation:  Plan:  denied Intent:  denied Means:  denied Homicidal Ideation:  Plan:  denied Intent:  denied Means:  denied AEB (as evidenced by):  Psychiatric Specialty Exam: Review of Systems  Constitutional: Negative.   HENT: Negative.   Eyes: Negative.   Respiratory: Negative.   Cardiovascular: Negative.   Gastrointestinal: Negative.   Genitourinary: Negative.   Musculoskeletal: Positive for myalgias.  Skin: Negative.   Neurological: Negative.   Endo/Heme/Allergies: Negative.   Psychiatric/Behavioral: Positive for depression and hallucinations. The patient is nervous/anxious and has insomnia.        Labile and irritable and easily agitated    Blood pressure 120/73, pulse 82, temperature 96.9 F (36.1 C), temperature source Oral, resp. rate 20, height 5\' 1"  (1.549 m), weight 45.133 kg (99 lb 8 oz), SpO2 100.00%.Body mass index is 18.81 kg/(m^2).  General Appearance: Disheveled  Eye Contact::  Poor  Speech:  Garbled and Pressured  Volume:  Decreased  Mood:  Dysphoric  Affect:  Blunt   Thought Process:  Disorganized  Orientation:  Only to person  Thought Content:  Hallucinations: Auditory  Suicidal Thoughts:  No  Homicidal Thoughts:  No  Memory:  Immediate;   Poor Recent;   Poor Remote;   Fair  Judgement:  Poor  Insight:  Lacking  Psychomotor Activity:  Decreased  Concentration:  Poor  Recall:  Poor  Akathisia:  No  Handed:  Right  AIMS (if indicated):     Assets:  Desire for Improvement Social Support  Sleep:  Number of Hours: 4.5   Current Medications: Current Facility-Administered Medications  Medication Dose Route Frequency Provider Last Rate Last Dose  . acetaminophen (TYLENOL) tablet 650 mg  650 mg Oral Q6H PRN Shuvon Rankin, NP   650 mg at 03/11/13 1124  . alum & mag hydroxide-simeth (MAALOX/MYLANTA) 200-200-20 MG/5ML suspension 30 mL  30 mL Oral Q4H PRN Shuvon Rankin, NP      . divalproex (DEPAKOTE SPRINKLE) capsule 1,000 mg  1,000 mg Oral QHS Mojeed Akintayo   1,000 mg at 03/16/13 2124  . feeding supplement (ENSURE COMPLETE) liquid 237 mL  237 mL Oral BID BM Verne Spurr, PA-C   237 mL at 03/18/13 0914  . ferrous sulfate tablet 325 mg  325 mg Oral Q breakfast Mojeed Akintayo   325 mg at 03/16/13 0831  . fluticasone (FLONASE) 50 MCG/ACT nasal spray 2 spray  2 spray Each Nare Daily Shuvon Rankin, NP   2 spray at 03/10/13 2952  . hydrochlorothiazide (HYDRODIURIL) tablet 25 mg  25 mg Oral  Daily Shuvon Rankin, NP   25 mg at 03/18/13 0913  . irbesartan (AVAPRO) tablet 150 mg  150 mg Oral Daily Shuvon Rankin, NP   150 mg at 03/18/13 0912  . LORazepam (ATIVAN) tablet 1 mg  1 mg Oral BID Mojeed Akintayo   1 mg at 03/18/13 0828  . magnesium hydroxide (MILK OF MAGNESIA) suspension 30 mL  30 mL Oral Daily PRN Shuvon Rankin, NP      . risperiDONE (RISPERDAL) tablet 3 mg  3 mg Oral BID Mojeed Akintayo   3 mg at 03/18/13 0828  . traZODone (DESYREL) tablet 100 mg  100 mg Oral QHS PRN Mojeed Akintayo   100 mg at 03/15/13 2203    Lab Results:  No results found for  this or any previous visit (from the past 48 hour(s)).  Physical Findings: AIMS: Facial and Oral Movements Muscles of Facial Expression: None, normal Lips and Perioral Area: None, normal Jaw: None, normal Tongue: None, normal,Extremity Movements Upper (arms, wrists, hands, fingers): None, normal Lower (legs, knees, ankles, toes): None, normal, Trunk Movements Neck, shoulders, hips: None, normal, Overall Severity Severity of abnormal movements (highest score from questions above): None, normal Incapacitation due to abnormal movements: None, normal Patient's awareness of abnormal movements (rate only patient's report): No Awareness, Dental Status Current problems with teeth and/or dentures?: No Does patient usually wear dentures?: Yes  CIWA:    COWS:     Treatment Plan Summary: Daily contact with patient to assess and evaluate symptoms and progress in treatment Medication management  Plan:1. Admit for crisis management and stabilization. 2. Medication management to reduce current symptoms to base line and improve the     patient's overall level of functioning 3. Treat health problems as indicated. 4. Develop treatment plan to decrease risk of relapse upon discharge and the need for     readmission. 5. Psycho-social education regarding relapse prevention and self care. 6. Health care follow up as needed for medical problems. 7.  continue Risperdal to 3 mg at twice a day for delusion and psychosis 8. continue Ativan 1mg  po BID for agitation   Medical Decision Making Problem Points:  Established problem, stable/improving (1), Review of last therapy session (1) and Review of psycho-social stressors (1) Data Points:  Order Aims Assessment (2) Review of medication regiment & side effects (2) Review of new medications or change in dosage (2)  I certify that inpatient services furnished can reasonably be expected to improve the patient's condition.   Anyae Griffith T.,MD 03/18/2013, 12:58  PM

## 2013-03-18 NOTE — Progress Notes (Signed)
Patient ID: Kaylee Kelley, female   DOB: 1946-06-04, 67 y.o.   MRN: [id number] Psychoeducational Group Note  Date:  03/18/2013 Time:1000am  Group Topic/Focus:  Identifying Needs:   The focus of this group is to help patients identify their personal needs that have been historically problematic and identify healthy behaviors to address their needs.  Participation Level:  Did Not Attend  Participation Quality:    Affect: Cognitive:  Insight:  Engagement in Group:  Additional Comments:  Unable to participate- inventory and Psychoeducational group   Valente David 03/18/2013,10:18 AM

## 2013-03-18 NOTE — Progress Notes (Signed)
Attempted to give pt PM Depakote from 5/30.  I opened the capsules and put them in pudding.  Pt declined stating "I will take pill."  I then took the pills in the room.  Pt repeatedly stated "I do not take those pills."  Pt then covered her mouth with the blanket.

## 2013-03-18 NOTE — Progress Notes (Signed)
Patient ID: Kaylee Kelley, female   DOB: October 02, 1946, 67 y.o.   MRN: [id number] D. The patient was loud and yelling at someone that was not present in her room. Appears to be responding to auditory and perhaps visual hallucinations.Agitated and did not want anyone in her room. She was unable to attend group.  A. Attempt made to provide verbal support and administer HS medications.  R. Was not receptive to having staff in her room. Took only her Ativan and Risperdal. Refused her Depakote Sprinkles. Became very loud and yelled to get out of her room when medication offered. Would not allow staff to clean floor near her bed that was very sticky from food and drinks spilled on floor.

## 2013-03-18 NOTE — [facility] Group Notes (Signed)
[facility] Group Notes: (Clinical Social Work)   03/18/2013      Type of Therapy:  Group Therapy   Participation Level:  Did Not Attend    Ambrose Mantle, LCSW 03/18/2013, 1:16 PM

## 2013-03-19 NOTE — Progress Notes (Signed)
Patient ID: Kaylee Kelley, female   DOB: [birth year], 67 y.o.   MRN: [id number] [facility] MD Progress Note  03/19/2013 9:34 AM Kaylee Kelley  MRN:  [id number]  Subjective: "I don't have any problem". Objective: Patient seen and chart reviewed .  Patient remains labile irritable and disorganized.  However she is more calm than yesterday.  She did not talk but also she did not scream .  As best she has taking the medication but still requires redirection and excessive encouragement.  She does not participate in groups.  She does not engage in conversation.  She endorse she does not have any problem .  She's compliant with the medication but refusing blood work.  Her thought process remains disorganized and labile.  She remains withdrawn isolated and easily irritable.      Diagnosis:  Chronic Paranoid Schizophrenia   ADL's:  Intact  Sleep: poor  Appetite:  Poor  Suicidal Ideation:  Plan:  denied Intent:  denied Means:  denied Homicidal Ideation:  Plan:  denied Intent:  denied Means:  denied AEB (as evidenced by):  Psychiatric Specialty Exam: Review of Systems  Constitutional: Negative.   HENT: Negative.   Eyes: Negative.   Respiratory: Negative.   Cardiovascular: Negative.   Gastrointestinal: Negative.   Genitourinary: Negative.   Musculoskeletal: Positive for myalgias.  Skin: Negative.   Neurological: Negative.   Endo/Heme/Allergies: Negative.   Psychiatric/Behavioral: Positive for depression and hallucinations. The patient is nervous/anxious and has insomnia.        Labile and irritable and easily agitated    Blood pressure 114/68, pulse 97, temperature 97 F (36.1 C), temperature source Oral, resp. rate 16, height 5\' 1"  (1.549 m), weight 45.36 kg (100 lb), SpO2 100.00%.Body mass index is 18.9 kg/(m^2).  General Appearance: Disheveled  Eye Contact::  Poor  Speech:  Garbled and Pressured  Volume:  Decreased  Mood:  Dysphoric  Affect:  Blunt  Thought Process:  Disorganized   Orientation:  Only to person  Thought Content:  Hallucinations: Auditory  Suicidal Thoughts:  No  Homicidal Thoughts:  No  Memory:  Immediate;   Poor Recent;   Poor Remote;   Fair  Judgement:  Poor  Insight:  Lacking  Psychomotor Activity:  Decreased  Concentration:  Poor  Recall:  Poor  Akathisia:  No  Handed:  Right  AIMS (if indicated):     Assets:  Desire for Improvement Social Support  Sleep:  Number of Hours: 4.5   Current Medications: Current Facility-Administered Medications  Medication Dose Route Frequency Provider Last Rate Last Dose  . acetaminophen (TYLENOL) tablet 650 mg  650 mg Oral Q6H PRN Shuvon Rankin, NP   650 mg at 03/11/13 1124  . alum & mag hydroxide-simeth (MAALOX/MYLANTA) 200-200-20 MG/5ML suspension 30 mL  30 mL Oral Q4H PRN Shuvon Rankin, NP      . feeding supplement (ENSURE COMPLETE) liquid 237 mL  237 mL Oral BID BM Verne Spurr, PA-C   237 mL at 03/18/13 0914  . ferrous sulfate tablet 325 mg  325 mg Oral Q breakfast Mojeed Akintayo   325 mg at 03/16/13 0831  . fluticasone (FLONASE) 50 MCG/ACT nasal spray 2 spray  2 spray Each Nare Daily Shuvon Rankin, NP   2 spray at 03/10/13 0865  . hydrochlorothiazide (HYDRODIURIL) tablet 25 mg  25 mg Oral Daily Shuvon Rankin, NP   25 mg at 03/19/13 0832  . irbesartan (AVAPRO) tablet 150 mg  150 mg Oral Daily Shuvon Rankin, NP   150  mg at 03/19/13 0832  . LORazepam (ATIVAN) tablet 1 mg  1 mg Oral BID Mojeed Akintayo   1 mg at 03/19/13 0745  . magnesium hydroxide (MILK OF MAGNESIA) suspension 30 mL  30 mL Oral Daily PRN Shuvon Rankin, NP      . risperiDONE (RISPERDAL) tablet 3 mg  3 mg Oral BID Mojeed Akintayo   3 mg at 03/19/13 0745  . traZODone (DESYREL) tablet 100 mg  100 mg Oral QHS PRN Mojeed Akintayo   100 mg at 03/15/13 2203  . Valproic Acid (DEPAKENE) 250 MG/5ML syrup SYRP 1,000 mg  1,000 mg Oral QHS Sanjuana Kava, NP   1,000 mg at 03/18/13 2241    Lab Results:  No results found for this or any previous  visit (from the past 48 hour(s)).  Physical Findings: AIMS: Facial and Oral Movements Muscles of Facial Expression: None, normal Lips and Perioral Area: None, normal Jaw: None, normal Tongue: None, normal,Extremity Movements Upper (arms, wrists, hands, fingers): None, normal Lower (legs, knees, ankles, toes): None, normal, Trunk Movements Neck, shoulders, hips: None, normal, Overall Severity Severity of abnormal movements (highest score from questions above): None, normal Incapacitation due to abnormal movements: None, normal Patient's awareness of abnormal movements (rate only patient's report): No Awareness, Dental Status Current problems with teeth and/or dentures?: No Does patient usually wear dentures?: Yes  CIWA:    COWS:     Treatment Plan Summary: Daily contact with patient to assess and evaluate symptoms and progress in treatment Medication management  Plan:1. Admit for crisis management and stabilization. 2. Medication management to reduce current symptoms to base line and improve the     patient's overall level of functioning 3. Treat health problems as indicated. 4. Develop treatment plan to decrease risk of relapse upon discharge and the need for     readmission. 5. Psycho-social education regarding relapse prevention and self care. 6. Health care follow up as needed for medical problems. 7.  continue Risperdal to 3 mg at twice a day for delusion and psychosis and Depakote for mood lability.  We will do Depakote level .  Patient has been refusing blood work , we will try tomorrow again to get the blood work.   8. continue Ativan 1mg  po BID for agitation   Medical Decision Making Problem Points:  Established problem, stable/improving (1), Review of last therapy session (1) and Review of psycho-social stressors (1) Data Points:  Order Aims Assessment (2) Review of medication regiment & side effects (2) Review of new medications or change in dosage (2)  I certify that  inpatient services furnished can reasonably be expected to improve the patient's condition.   Mackenzy Grumbine T.,MD 03/19/2013, 9:34 AM

## 2013-03-19 NOTE — [facility] Group Notes (Signed)
[facility] Group Notes:  (Clinical Social Work)  03/19/2013   11:15-11:45AM  Summary of Progress/Problems:  The main focus of today's process group was to listen to a variety of genres of music and to identify that different types of music provoke different responses.  The patient then was able to identify personally what was soothing for them, as well as energizing.  Handouts were used to record feelings evoked, as well as how patient can personally use this knowledge in sleep habits, with depression, and with other symptoms.  The patient expressed only for the first song that it made her feel sleepy, but then started talking to herself or an unknown person, became disruptive to the remaining people in group because she was talking constantly out loud over the music.  CSW asked her to return to her room, which she did while continuing to [employer] constantly.  Type of Therapy:  Music Therapy   Participation Level:  Minimal  Participation Quality:  Inattentive  Affect:  Blunted  Cognitive:  Hallucinating  Insight:  Limited  Engagement in Therapy:  Limited  Modes of Intervention:   Activity, Exploration  Ambrose Mantle, LCSW 03/19/2013, 1:14 PM

## 2013-03-19 NOTE — Progress Notes (Addendum)
Patient ID: Kaylee Kelley, female   DOB: 09-03-1946, 67 y.o.   MRN: [id number] D: "I'm so cold!" (Said while crying out loud.)  A:  Pt. denies lethality, but will not state she does not have A/V/H's: Pt. Often heard talking to herself, when no one else is in the room.  Pt. was found sitting at the side of her bed, crying and c/o as above, while her blankets were mostly lying on the floor and she was covered only partially with a bath blanket over her shoulders.  Pt. as encouraged gingerly and assisted to lie down and cover herself so she could rest.   Pt. was calmer after this time and eventually fell asleep.  Pt. remains disoriented to time, place and situation.  Pt. remains easily disoriented and agitated and takes some time for self-soothing to occur.  Pt. Does take her meds, but refuses food supplements.  R: Will continue to monitor for changes.

## 2013-03-19 NOTE — Progress Notes (Signed)
Patient ID: Kaylee Kelley, female   DOB: 19-Jul-1946, 67 y.o.   MRN: [id number] D. The patient spent the evening in her room. Appears to be actively hallucinating as she is yelling at and speaking to someone although there is no one in her room. She demanded to have dinner brought to her before she would take her medicine. A dinner tray was heated up for her. When she was done eating took her Risperdal and Ativan but refused her Depakote Sprinkles. A. NP on call was called and asked to change the Depakote Sprinkles back to Depakene because she has refused 7 doses of the medication. R. The patient took her Depakene without a problem. Continues to yell out and speak to someone who is not present in the room.

## 2013-03-19 NOTE — Progress Notes (Signed)
Patient ID: Kaylee Kelley, female   DOB: 23-Dec-1945, 67 y.o.   MRN: [id number] Psychoeducational Group Note  Date:  03/19/2013 Time:  1000am  Group Topic/Focus:  Making Healthy Choices:   The focus of this group is to help patients identify negative/unhealthy choices they were using prior to admission and identify positive/healthier coping strategies to replace them upon discharge.  Participation Level:  Did Not Attend  Participation Quality:    Affect:  Cognitive:  Insight:Engagement in Group:  Additional Comments:  Unable to participate: inventory and Psychoeducational group  Valente David 03/19/2013,10:11 AM

## 2013-03-19 NOTE — Progress Notes (Signed)
Patient ID: Kaylee Kelley, female   DOB: December 18, 1945, 67 y.o.   MRN: [id number] Psychoeducational Group Note  Date:  03/19/2013 Time:  1000am  Group Topic/Focus:  Making Healthy Choices:   The focus of this group is to help patients identify negative/unhealthy choices they were using prior to admission and identify positive/healthier coping strategies to replace them upon discharge.  Participation Level:  Did Not Attend  Participation Quality:    Affect: Cognitive:   Insight: Engagement in Group:  Additional Comments:  Unable to participate. Spiritual group   Valente David 03/19/2013,11:27 AM

## 2013-03-20 MED ORDER — LORAZEPAM 1 MG PO TABS
1.0000 mg | ORAL_TABLET | Freq: Three times a day (TID) | ORAL | Status: DC
  Administered 2013-03-20 – 2013-03-21 (×3): 1 mg via ORAL
  Filled 2013-03-20 (×3): qty 1

## 2013-03-20 NOTE — [facility] Group Notes (Signed)
[facility] LCSW Aftercare Discharge Planning Group Note   03/20/2013 4:33 PM  Participation Quality:  Did not attend    [place]

## 2013-03-20 NOTE — Progress Notes (Signed)
D:Pt is paranoid, disorganized and talking to her self in a high pitched voice. When writer attempted to give pt her medication she spoke rapidly calling the pills "sperm" saying that she was "naked" and "had a baby." Pt speech is rapid, slurred and difficult to understand.  Pt was reluctant and guarded when taking her medication. MD came into room with writer and after prompting from staff, pt took her medication. A:Offered support, encouragement, 15 minute checks and redirection. R:Pt remains safe on the unit.

## 2013-03-20 NOTE — Progress Notes (Signed)
Patient ID: Kaylee Kelley, female   DOB: January 25, 1946, 67 y.o.   MRN: [id number] D. The patient is labile and her thoughts are still disorganized. She rapidly repeats herself over and over again. Appears to be actively hallucinating. It is difficult to understand her. She walked into evening group tonight and sat down for a short period of time. When it was her turn to talk she did not want to respond and left group. A. Medications reviewed with patient. She was concerned about her Risperdal being a different color tonight and initially refused her Depakene because she thought it was Tylenol. R. Eventually took all her medications.

## 2013-03-20 NOTE — Progress Notes (Signed)
Patient ID: Kaylee Kelley, female   DOB: Jan 03, 1946, 67 y.o.   MRN: [id number] [facility] MD Progress Note  03/20/2013 10:52 AM Kaylee Kelley  MRN:  [id number]  Subjective: "I don't want to go home, my niece would not let me cook my own food". Objective: Patient seen and chart reviewed. Patient remains delusional, paranoid, labile, irritable and disorganized. She now takes her medications with much persuasion, she requires a lot of redirections and encouragement.  She is self isolated in her room and  does not participate in unit activities.  She's compliant with the medication and has not verbalized any adverse reactions. Current Depakote level is 30.1      Diagnosis:  Chronic Paranoid Schizophrenia   ADL's:  Intact  Sleep: poor  Appetite:  Poor  Suicidal Ideation:  Plan:  denied Intent:  denied Means:  denied Homicidal Ideation:  Plan:  denied Intent:  denied Means:  denied AEB (as evidenced by):  Psychiatric Specialty Exam: Review of Systems  Constitutional: Negative.   HENT: Negative.   Eyes: Negative.   Respiratory: Negative.   Cardiovascular: Negative.   Gastrointestinal: Negative.   Genitourinary: Negative.   Musculoskeletal: Positive for myalgias.  Skin: Negative.   Neurological: Negative.   Endo/Heme/Allergies: Negative.   Psychiatric/Behavioral: Positive for depression and hallucinations. The patient is nervous/anxious and has insomnia.        Labile and irritable and easily agitated    Blood pressure 125/71, pulse 81, temperature 97 F (36.1 C), temperature source Oral, resp. rate 18, height 5\' 1"  (1.549 m), weight 45.36 kg (100 lb), SpO2 100.00%.Body mass index is 18.9 kg/(m^2).  General Appearance: Disheveled  Eye Contact::  Poor  Speech:  Garbled and Pressured  Volume:  Decreased  Mood:  Dysphoric  Affect:  Blunt  Thought Process:  Disorganized  Orientation:  Only to person  Thought Content:  Hallucinations: Auditory  Suicidal Thoughts:  No  Homicidal  Thoughts:  No  Memory:  Immediate;   Poor Recent;   Poor Remote;   Fair  Judgement:  Poor  Insight:  Lacking  Psychomotor Activity:  Decreased  Concentration:  Poor  Recall:  Poor  Akathisia:  No  Handed:  Right  AIMS (if indicated):     Assets:  Desire for Improvement Social Support  Sleep:  Number of Hours: 1   Current Medications: Current Facility-Administered Medications  Medication Dose Route Frequency Provider Last Rate Last Dose  . acetaminophen (TYLENOL) tablet 650 mg  650 mg Oral Q6H PRN Shuvon Rankin, NP   650 mg at 03/11/13 1124  . alum & mag hydroxide-simeth (MAALOX/MYLANTA) 200-200-20 MG/5ML suspension 30 mL  30 mL Oral Q4H PRN Shuvon Rankin, NP      . feeding supplement (ENSURE COMPLETE) liquid 237 mL  237 mL Oral BID BM Verne Spurr, PA-C   237 mL at 03/18/13 0914  . ferrous sulfate tablet 325 mg  325 mg Oral Q breakfast Chontel Warning   325 mg at 03/20/13 0900  . fluticasone (FLONASE) 50 MCG/ACT nasal spray 2 spray  2 spray Each Nare Daily Shuvon Rankin, NP   2 spray at 03/10/13 3474  . hydrochlorothiazide (HYDRODIURIL) tablet 25 mg  25 mg Oral Daily Shuvon Rankin, NP   25 mg at 03/20/13 0900  . irbesartan (AVAPRO) tablet 150 mg  150 mg Oral Daily Shuvon Rankin, NP   150 mg at 03/20/13 0900  . LORazepam (ATIVAN) tablet 1 mg  1 mg Oral BID Sharonann Malbrough   1 mg at 03/20/13  0900  . magnesium hydroxide (MILK OF MAGNESIA) suspension 30 mL  30 mL Oral Daily PRN Shuvon Rankin, NP      . risperiDONE (RISPERDAL) tablet 3 mg  3 mg Oral BID Declan Adamson   3 mg at 03/20/13 0900  . traZODone (DESYREL) tablet 100 mg  100 mg Oral QHS PRN Malakhai Beitler   100 mg at 03/15/13 2203  . Valproic Acid (DEPAKENE) 250 MG/5ML syrup SYRP 1,000 mg  1,000 mg Oral QHS Sanjuana Kava, NP   1,000 mg at 03/19/13 2135    Lab Results:  Results for orders placed during the hospital encounter of 03/03/13 (from the past 48 hour(s))  VALPROIC ACID LEVEL     Status: Abnormal   Collection Time     03/19/13  7:39 PM      Result Value Range   Valproic Acid Lvl 30.1 (*) 50.0 - 100.0 ug/mL    Physical Findings: AIMS: Facial and Oral Movements Muscles of Facial Expression: None, normal Lips and Perioral Area: None, normal Jaw: None, normal Tongue: None, normal,Extremity Movements Upper (arms, wrists, hands, fingers): None, normal Lower (legs, knees, ankles, toes): None, normal, Trunk Movements Neck, shoulders, hips: None, normal, Overall Severity Severity of abnormal movements (highest score from questions above): None, normal Incapacitation due to abnormal movements: None, normal Patient's awareness of abnormal movements (rate only patient's report): No Awareness, Dental Status Current problems with teeth and/or dentures?: No Does patient usually wear dentures?: Yes  CIWA:    COWS:     Treatment Plan Summary: Daily contact with patient to assess and evaluate symptoms and progress in treatment Medication management  Plan:1. Admit for crisis management and stabilization. 2. Medication management to reduce current symptoms to base line and improve the     patient's overall level of functioning 3. Treat health problems as indicated. 4. Develop treatment plan to decrease risk of relapse upon discharge and the need for     readmission. 5. Psycho-social education regarding relapse prevention and self care. 6. Health care follow up as needed for medical problems. 7.  Continue Risperdal  3 mg at twice a day for delusion and psychosis and Depakote for mood lability.  8. Continue Ativan 1mg  po TID for agitation   Medical Decision Making Problem Points:  Established problem, stable/improving (1), Review of last therapy session (1) and Review of psycho-social stressors (1) Data Points:  Order Aims Assessment (2) Review of medication regiment & side effects (2) Review of new medications or change in dosage (2)  I certify that inpatient services furnished can reasonably be expected to  improve the patient's condition.   Dylan Ruotolo,MD 03/20/2013, 10:52 AM

## 2013-03-20 NOTE — Progress Notes (Signed)
Pt refused scheduled Ensure and said that she would eat ice cream. MHT is assisting pt with ice cream and reported that pt ate a portion of her breakfast nibbling from her breakfast tray throughout the morning. Pt refused to get weight this morning and MHT reported that she would try again during the day shift. Safety maintained.

## 2013-03-20 NOTE — Progress Notes (Signed)
D: Patient resting in bed with eyes closed.  Respirations even and unlabored.  Patient appears to be in no apparent distress.  Patient has been asleep tonight.   A: Staff to monitor Q 15 mins for safety. R:  Patient remains safe on the unit.  Patient did not attend group.  Will continue to monitor.  Patient did not receive medications because she has been asleep.

## 2013-03-20 NOTE — Progress Notes (Signed)
Recreation Therapy Notes  Date: 06.02.2014 Time: 9:30am Location: [address] Dayroom      Group Topic/Focus: Goal Setting  Participation Level: Did not attend  [employer], LRT/CTRS  Jearl Klinefelter 03/20/2013 10:13 AM

## 2013-03-20 NOTE — Clinical Social Work Note (Signed)
Spoke with niece, who visited pt yesterday afternoon, feels she is doing well, and plans to pick up tomorrow.

## 2013-03-20 NOTE — Clinical Social Work Note (Signed)
  Type of Therapy: Process Group Therapy  Participation Level:  Did Not Attend    Summary of Progress/Problems: Today's group addressed the issue of overcoming obstacles.  Patients were asked to identify their biggest obstacle post d/c that stands in the way of their on-going success, and then problem solve as to how to manage this.       Ida Rogue 03/20/2013   4:34 PM

## 2013-03-21 MED ORDER — TRAZODONE HCL 100 MG PO TABS: 100.0000 mg | ORAL_TABLET | Freq: Every day | ORAL | Status: DC

## 2013-03-21 MED ORDER — FERROUS SULFATE 325 (65 FE) MG PO TABS: 325.0000 mg | ORAL_TABLET | Freq: Every day | ORAL | Status: DC

## 2013-03-21 MED ORDER — LORAZEPAM 1 MG PO TABS: 1.0000 mg | ORAL_TABLET | Freq: Three times a day (TID) | ORAL | Status: DC

## 2013-03-21 MED ORDER — IRBESARTAN 150 MG PO TABS: 150.0000 mg | ORAL_TABLET | Freq: Every day | ORAL | Status: DC

## 2013-03-21 MED ORDER — RISPERIDONE 3 MG PO TABS: 3.0000 mg | ORAL_TABLET | Freq: Two times a day (BID) | ORAL | Status: DC

## 2013-03-21 MED ORDER — VALPROIC ACID 250 MG/5ML PO SYRP: 1000.0000 mg | ORAL_SOLUTION | Freq: Every day | ORAL | Status: AC

## 2013-03-21 NOTE — Discharge Summary (Signed)
Physician Discharge Summary Note  Patient:  Kaylee Kelley is an 67 y.o., female MRN:  [id number] DOB:  June 07, 1946 Patient phone:  [phone] (home)  Patient address:   [address] [place] [address],   Date of Admission:  03/03/2013 Date of Discharge: 03/21/2013  Reason for Admission: psychosis  Discharge Diagnoses: Principal Problem:   Schizophrenia, paranoid type Active Problems:   Anemia  Review of Systems  Constitutional: Negative.  Negative for fever, chills, weight loss, malaise/fatigue and diaphoresis.  HENT: Negative for congestion and sore throat.   Eyes: Negative for blurred vision, double vision and photophobia.  Respiratory: Negative for cough, shortness of breath and wheezing.   Cardiovascular: Negative for chest pain, palpitations and PND.  Gastrointestinal: Negative for heartburn, nausea, vomiting, abdominal pain, diarrhea and constipation.  Musculoskeletal: Negative for myalgias, joint pain and falls.  Neurological: Negative for dizziness, tingling, tremors, sensory change, speech change, focal weakness, seizures, loss of consciousness, weakness and headaches.  Endo/Heme/Allergies: Negative for polydipsia. Does not bruise/bleed easily.  Psychiatric/Behavioral: Negative for depression, suicidal ideas, hallucinations, memory loss and substance abuse. The patient is not nervous/anxious and does not have insomnia.   Discharge Diagnoses:  AXIS I: Chronic Paranoid Schizophrenia  AXIS II: Deferred  AXIS III:  Past Medical History   Diagnosis  Date   .  Hypertension    .  Pre-diabetes    AXIS IV: other psychosocial or environmental problems and problems related to social environment  AXIS V: 61-70 mild symptoms  Level of Care:  OP  Hospital Course:  Kaylee Kelley was admitted after being brought to the ED by her niece with whom she lives due to increasing agitation and medical non-compliance.  Kaylee Kelley was given medical clearance and transferred to [facility] for further  stabilization and care.        Upon admission Kaylee Kelley, who is well known to the [facility] staff as this is her 3rd admission within 12 months, was evaluated and found to be floridly psychotic, agitated and uncooperative, paranoid, not sleeping, and having lost significant amount of weight since her last admission.  She was started on medication and an IM consult was done to evaluate her anemia and weight loss.        Iron oral supplementation was started along with Risperdal. It was noted that Kaylee Kelley had been discharged on her last visit on Latuda 80mg  po BID, but had been changed to Risperdal by an out patient provider. The reasoning was never clear.  Kaylee Kelley was much slower to respond to treatment this admission, and continued to be paranoid especially around men. She was given ativan to help with the agitation. She would refuse the Depakote tablets, as well as the sprinkles this time. The staff went to great lengths to mix the sprinkles with soft foods to entice her to take it, but ultimately she would take the liquid form.         While [place] did become less agitated she continued to be paranoid and somewhat guarded. Her speech took longer to clear and not being understood clearly was frustrating for her as well as the staff.  Clinical evaluation did not reveal a source of the anemia, and further studies upon discharge were recommended including a mammogram and colonoscopy.  Clinically it was felt that Kaylee Kelley needed a higher level of care such as an assisted living facility, but the family was not in favor of this.        Kaylee Kelley was felt to be at baseline and  arrangements were made for her to discharge home with her niece. Kaylee Kelley was in much improved condition than upon arrival and felt to be stable for discharge home.  It was recommended that Kaylee Kelley not be left unsupervised due to her poor judgement regarding her medication, and poor decision making skills.  Consults:  Internal medicine  Significant Diagnostic  Studies:  labs: CBC, CMP, hemoccult, UA, Iron Studies  Discharge Vitals:   Blood pressure 125/83, pulse 85, temperature 98.9 F (37.2 C), temperature source Oral, resp. rate 20, height 5\' 1"  (1.549 m), weight 45.995 kg (101 lb 6.4 oz), SpO2 100.00%. Body mass index is 19.17 kg/(m^2). Lab Results:   Results for orders placed during the hospital encounter of 03/03/13 (from the past 72 hour(s))  VALPROIC ACID LEVEL     Status: Abnormal   Collection Time    03/19/13  7:39 PM      Result Value Range   Valproic Acid Lvl 30.1 (*) 50.0 - 100.0 ug/mL    Physical Findings: AIMS: Facial and Oral Movements Muscles of Facial Expression: None, normal Lips and Perioral Area: None, normal Jaw: None, normal Tongue: None, normal,Extremity Movements Upper (arms, wrists, hands, fingers): None, normal Lower (legs, knees, ankles, toes): None, normal, Trunk Movements Neck, shoulders, hips: None, normal, Overall Severity Severity of abnormal movements (highest score from questions above): None, normal Incapacitation due to abnormal movements: None, normal Patient's awareness of abnormal movements (rate only patient's report): No Awareness, Dental Status Current problems with teeth and/or dentures?: No Does patient usually wear dentures?: Yes  CIWA:    COWS:     Psychiatric Specialty Exam: See Psychiatric Specialty Exam and Suicide Risk Assessment completed by Attending Physician prior to discharge.  Discharge destination:  Home  Is patient on multiple antipsychotic therapies at discharge:  No   Has Patient had three or more failed trials of antipsychotic monotherapy by history:  No   Recommended Plan for Multiple Antipsychotic Therapies: Not applicable    Discharge Orders   Future Orders Complete By Expires     Diet - low sodium heart healthy  As directed     Discharge instructions  As directed     Comments:      Take all of your medications as directed. Be sure to keep all of your follow  up appointments.  If you are unable to keep your follow up appointment, call your Doctor's office to let them know, and reschedule.  Make sure that you have enough medication to last until your appointment. Be sure to get plenty of rest. Going to bed at the same time each night will help. Try to avoid sleeping during the day.  Increase your activity as tolerated. Regular exercise will help you to sleep better and improve your mental health. Eating a heart healthy diet is recommended. Try to avoid salty or fried foods. Be sure to avoid all alcohol and illegal drugs.    Increase activity slowly  As directed         Medication List    STOP taking these medications       sertraline 100 MG tablet  Commonly known as:  ZOLOFT      TAKE these medications     Indication   ferrous sulfate 325 (65 FE) MG tablet  Take 1 tablet (325 mg total) by mouth daily with breakfast. For iron deficiency anemia.   Indication:  Iron Deficiency     fluticasone 50 MCG/ACT nasal spray  Commonly known as:  FLONASE  Place 2 sprays into the nose daily. For seasonal allergies.      hydrochlorothiazide 25 MG tablet  Commonly known as:  HYDRODIURIL  Take 1 tablet (25 mg total) by mouth daily. For blood pressure control.      irbesartan 150 MG tablet  Commonly known as:  AVAPRO  Take 1 tablet (150 mg total) by mouth daily. For blood pressure control.      irbesartan 150 MG tablet  Commonly known as:  AVAPRO  Take 1 tablet (150 mg total) by mouth daily.   Indication:  High Blood Pressure     LORazepam 1 MG tablet  Commonly known as:  ATIVAN  Take 1 tablet (1 mg total) by mouth 3 (three) times daily. For anxiety.   Indication:  Feeling Anxious, Trouble Sleeping due to Feeling Anxious, Schizophrenia     potassium chloride SA 20 MEQ tablet  Commonly known as:  K-DUR,KLOR-CON  Take 1 tablet (20 mEq total) by mouth daily with breakfast. For hypokalemia.      risperiDONE 3 MG tablet  Commonly known as:   RISPERDAL  Take 1 tablet (3 mg total) by mouth 2 (two) times daily. For psychosis.   Indication:  Schizophrenia     traZODone 100 MG tablet  Commonly known as:  DESYREL  Take 1 tablet (100 mg total) by mouth at bedtime. For insomnia.   Indication:  Trouble Sleeping     Valproic Acid 250 MG/5ML Syrp syrup  Commonly known as:  DEPAKENE  Take 20 mLs (1,000 mg total) by mouth at bedtime. For mood stability.            Follow-up Information   Follow up with [facility] Team. (Will see you the day of d/c)    Contact information:   [address]  [place]  [phone]] [phone]      Follow-up recommendations:   1. Activity as tolerated. 2. Heart healthy diet. 3. See your primary care doctor for scheduling mammogram, colonoscopy, and follow up blood work. Comments:  Ashleynicole is worrisome for the need readmission due to the long number of years with this illness and the  Need for closer supervision with her medication management and diet. The family may not be able to provide this supervision for Deshonda due to work demands.  Total Discharge Time:  Greater than 30 minutes.  Signed: Taylar Hartsough 03/21/2013, 9:10 AM

## 2013-03-21 NOTE — Progress Notes (Signed)
[facility] Adult Case Management Discharge Plan :  Will you be returning to the same living situation after discharge: Yes,  home At discharge, do you have transportation home?:Yes,  family Do you have the ability to pay for your medications:Yes,  [facility]  Release of information consent forms completed and in the chart;  Patient's signature needed at discharge.  Patient to Follow up at: Follow-up Information   Follow up with [facility] Team. (Will see you the day of d/c)    Contact information:   [address]  [place]  [336] [phone]      Patient denies SI/HI:   Yes,  yes    Safety Planning and Suicide Prevention discussed:  Yes,  yes  Ida Rogue 03/21/2013, 4:28 PM

## 2013-03-21 NOTE — Tx Team (Signed)
  Interdisciplinary Treatment Plan Update   Date Reviewed:  03/21/2013  Time Reviewed:  4:29 PM  Progress in Treatment:   Attending groups: Yes Participating in groups: Yes Taking medication as prescribed: Yes  Tolerating medication: Yes Family/Significant other contact made: Yes  Patient understands diagnosis: Yes  Discussing patient identified problems/goals with staff: Yes Medical problems stabilized or resolved: Yes Denies suicidal/homicidal ideation: Yes Patient has not harmed self or others: Yes  For review of initial/current patient goals, please see plan of care.  Estimated Length of Stay:  D/C today  Reason for Continuation of Hospitalization:   New Problems/Goals identified:  N/A  Discharge Plan or Barriers:   return home, follow up with [facility]  Additional Comments:  Attendees:  Signature: Thedore Mins, MD 03/21/2013 4:29 PM   Signature: Richelle Ito, LCSW 03/21/2013 4:29 PM  Signature: Verne Spurr, PA 03/21/2013 4:29 PM  Signature:  03/21/2013 4:29 PM  Signature: Liborio Nixon, RN 03/21/2013 4:29 PM  Signature:  03/21/2013 4:29 PM  Signature:   03/21/2013 4:29 PM  Signature:    Signature:    Signature:    Signature:    Signature:    Signature:      Scribe for Treatment Team:   Richelle Ito, LCSW  03/21/2013 4:29 PM

## 2013-03-21 NOTE — Progress Notes (Signed)
Adult Psychoeducational Group Note  Date:  03/21/2013 Time:  10:33 AM  Group Topic/Focus:  Recovery Goals:   The focus of this group is to identify appropriate goals for recovery and establish a plan to achieve them.  Participation Level:  Did Not Attend  Participation Quality:  did not attend group  Affect:  did not attend group  Cognitive:  did not attend group  Insight: None  Engagement in Group:  None  Modes of Intervention:  Discussion, Education and Support  Additional Comments:  Rayvon did not attend group today.   Nichola Sizer 03/21/2013, 10:33 AM

## 2013-03-21 NOTE — Progress Notes (Signed)
Discharged note: Pt discharged to self and picked up by her niece [name]. Pt denies SI/HI/AVH. Both received verbal and written discharged instructions. Pt agreed to f/u appt and med regimen. Pt received all belongings from room. No items in locker. Pt received prescriptions and sample meds. Pt left the facility safely with [name].

## 2013-03-21 NOTE — [facility] Suicide Risk Assessment (Signed)
Suicide Risk Assessment  Discharge Assessment     Demographic Factors:  Low socioeconomic status, Unemployed and female  Mental Status Per Nursing Assessment::   On Admission:  NA  Current Mental Status by Physician: patient denies suicidal ideation, intent or plan  Loss Factors: Decrease in vocational status and Financial problems/change in socioeconomic status  Historical Factors: Impulsivity  Risk Reduction Factors:   Living with another person, especially a relative, Positive social support and Positive therapeutic relationship  Continued Clinical Symptoms:  Schizophrenia:   Paranoid or undifferentiated type  Cognitive Features That Contribute To Risk:  Closed-mindedness Polarized thinking    Suicide Risk:  Minimal: No identifiable suicidal ideation.  Patients presenting with no risk factors but with morbid ruminations; may be classified as minimal risk based on the severity of the depressive symptoms  Discharge Diagnoses:   AXIS I:  Chronic Paranoid Schizophrenia AXIS II:  Deferred AXIS III:   Past Medical History  Diagnosis Date  . Hypertension   . Pre-diabetes    AXIS IV:  other psychosocial or environmental problems and problems related to social environment AXIS V:  61-70 mild symptoms  Plan Of Care/Follow-up recommendations:  Activity:  as tolerated Diet:  healthy Tests:  Valproic acid level. Current Valproic acid level is 30.1 Other:  patient to keep her after care apointment   Is patient on multiple antipsychotic therapies at discharge:  No   Has Patient had three or more failed trials of antipsychotic monotherapy by history:  No  Recommended Plan for Multiple Antipsychotic Therapies: N/A  Kaoru Rezendes,MD 03/21/2013, 9:12 AM

## 2013-03-23 NOTE — Progress Notes (Signed)
Patient Discharge Instructions:  After Visit Summary (AVS):   Faxed to:  03/23/13 Psychiatric Admission Assessment Note:   Faxed to:  03/23/13 Suicide Risk Assessment - Discharge Assessment:   Faxed to:  03/23/13 Faxed/Sent to the Next Level Care provider:  03/23/13 Faxed to [facility] @ [phone]  Jerelene Redden, 03/23/2013, 3:32 PM

## 2013-03-28 NOTE — Discharge Summary (Signed)
Seen and agreed. Tillman Kazmierski, MD 

## 2013-04-03 DIAGNOSIS — I10 Essential (primary) hypertension: Secondary | ICD-10-CM | POA: Diagnosis not present

## 2013-04-03 DIAGNOSIS — E119 Type 2 diabetes mellitus without complications: Secondary | ICD-10-CM | POA: Diagnosis not present

## 2013-04-03 DIAGNOSIS — F319 Bipolar disorder, unspecified: Secondary | ICD-10-CM | POA: Diagnosis not present

## 2013-04-03 DIAGNOSIS — J3089 Other allergic rhinitis: Secondary | ICD-10-CM | POA: Diagnosis not present

## 2013-04-04 ENCOUNTER — Other Ambulatory Visit: Payer: Self-pay | Admitting: Internal Medicine

## 2013-04-04 DIAGNOSIS — Z1231 Encounter for screening mammogram for malignant neoplasm of breast: Secondary | ICD-10-CM

## 2013-04-17 DIAGNOSIS — M25579 Pain in unspecified ankle and joints of unspecified foot: Secondary | ICD-10-CM | POA: Diagnosis not present

## 2013-04-17 DIAGNOSIS — M19019 Primary osteoarthritis, unspecified shoulder: Secondary | ICD-10-CM | POA: Diagnosis not present

## 2013-04-17 DIAGNOSIS — I10 Essential (primary) hypertension: Secondary | ICD-10-CM | POA: Diagnosis not present

## 2013-04-17 DIAGNOSIS — E119 Type 2 diabetes mellitus without complications: Secondary | ICD-10-CM | POA: Diagnosis not present

## 2013-06-18 ENCOUNTER — Other Ambulatory Visit (HOSPITAL_COMMUNITY): Payer: Self-pay | Admitting: Physician Assistant

## 2013-06-29 DIAGNOSIS — F209 Schizophrenia, unspecified: Secondary | ICD-10-CM | POA: Diagnosis not present

## 2014-01-03 DIAGNOSIS — E1149 Type 2 diabetes mellitus with other diabetic neurological complication: Secondary | ICD-10-CM | POA: Diagnosis not present

## 2014-01-03 DIAGNOSIS — Z13220 Encounter for screening for lipoid disorders: Secondary | ICD-10-CM | POA: Diagnosis not present

## 2014-01-03 DIAGNOSIS — I10 Essential (primary) hypertension: Secondary | ICD-10-CM | POA: Diagnosis not present

## 2014-01-03 DIAGNOSIS — E1049 Type 1 diabetes mellitus with other diabetic neurological complication: Secondary | ICD-10-CM | POA: Diagnosis not present

## 2014-02-17 ENCOUNTER — Emergency Department (HOSPITAL_COMMUNITY)
Admission: EM | Admit: 2014-02-17 | Discharge: 2014-02-17 | Disposition: A | Discharge status: Home / Self Care | Payer: Medicare Other | Attending: Emergency Medicine | Admitting: Emergency Medicine

## 2014-02-17 ENCOUNTER — Encounter (HOSPITAL_COMMUNITY): Payer: Self-pay | Admitting: Emergency Medicine

## 2014-02-17 DIAGNOSIS — Z862 Personal history of diseases of the blood and blood-forming organs and certain disorders involving the immune mechanism: Secondary | ICD-10-CM | POA: Diagnosis not present

## 2014-02-17 DIAGNOSIS — R21 Rash and other nonspecific skin eruption: Secondary | ICD-10-CM | POA: Diagnosis not present

## 2014-02-17 DIAGNOSIS — F259 Schizoaffective disorder, unspecified: Secondary | ICD-10-CM | POA: Diagnosis not present

## 2014-02-17 DIAGNOSIS — Z79899 Other long term (current) drug therapy: Secondary | ICD-10-CM | POA: Insufficient documentation

## 2014-02-17 DIAGNOSIS — R4182 Altered mental status, unspecified: Secondary | ICD-10-CM | POA: Insufficient documentation

## 2014-02-17 DIAGNOSIS — F319 Bipolar disorder, unspecified: Secondary | ICD-10-CM | POA: Insufficient documentation

## 2014-02-17 DIAGNOSIS — F29 Unspecified psychosis not due to a substance or known physiological condition: Secondary | ICD-10-CM | POA: Diagnosis not present

## 2014-02-17 DIAGNOSIS — I10 Essential (primary) hypertension: Secondary | ICD-10-CM | POA: Diagnosis not present

## 2014-02-17 DIAGNOSIS — F200 Paranoid schizophrenia: Secondary | ICD-10-CM | POA: Diagnosis present

## 2014-02-17 DIAGNOSIS — Z8639 Personal history of other endocrine, nutritional and metabolic disease: Secondary | ICD-10-CM | POA: Insufficient documentation

## 2014-02-17 LAB — CBC WITH DIFFERENTIAL/PLATELET
BASOS PCT: 0 % (ref 0–1)
Basophils Absolute: 0 10*3/uL (ref 0.0–0.1)
Eosinophils Absolute: 0 10*3/uL (ref 0.0–0.7)
Eosinophils Relative: 1 % (ref 0–5)
HCT: 34.3 % — ABNORMAL LOW (ref 36.0–46.0)
Hemoglobin: 12 g/dL (ref 12.0–15.0)
LYMPHS ABS: 2.6 10*3/uL (ref 0.7–4.0)
Lymphocytes Relative: 32 % (ref 12–46)
MCH: 27.6 pg (ref 26.0–34.0)
MCHC: 35 g/dL (ref 30.0–36.0)
MCV: 78.9 fL (ref 78.0–100.0)
MONOS PCT: 8 % (ref 3–12)
Monocytes Absolute: 0.6 10*3/uL (ref 0.1–1.0)
NEUTROS ABS: 4.8 10*3/uL (ref 1.7–7.7)
NEUTROS PCT: 60 % (ref 43–77)
Platelets: 216 10*3/uL (ref 150–400)
RBC: 4.35 MIL/uL (ref 3.87–5.11)
RDW: 13.6 % (ref 11.5–15.5)
WBC: 8.1 10*3/uL (ref 4.0–10.5)

## 2014-02-17 LAB — BASIC METABOLIC PANEL
BUN: 15 mg/dL (ref 6–23)
CHLORIDE: 98 meq/L (ref 96–112)
CO2: 30 mEq/L (ref 19–32)
Calcium: 9.6 mg/dL (ref 8.4–10.5)
Creatinine, Ser: 0.82 mg/dL (ref 0.50–1.10)
GFR calc non Af Amer: 73 mL/min — ABNORMAL LOW (ref >90)
GFR, EST AFRICAN AMERICAN: 85 mL/min — AB (ref >90)
GLUCOSE: 161 mg/dL — AB (ref 70–99)
POTASSIUM: 3.5 meq/L — AB (ref 3.7–5.3)
Sodium: 140 mEq/L (ref 137–147)

## 2014-02-17 LAB — ETHANOL: Alcohol, Ethyl (B): <11 mg/dL (ref 0–11)

## 2014-02-17 LAB — ACETAMINOPHEN LEVEL

## 2014-02-17 LAB — SALICYLATE LEVEL: Salicylate Lvl: <2 mg/dL — ABNORMAL LOW (ref 2.8–20.0)

## 2014-02-17 MED ORDER — ZIPRASIDONE MESYLATE 20 MG IM SOLR
20.0000 mg | Freq: Once | INTRAMUSCULAR | Status: AC
  Administered 2014-02-17: 20 mg via INTRAMUSCULAR
  Filled 2014-02-17: qty 20

## 2014-02-17 MED ORDER — ESCITALOPRAM OXALATE 10 MG PO TABS: 10.0000 mg | ORAL_TABLET | Freq: Every day | ORAL | Status: DC

## 2014-02-17 MED ORDER — PALIPERIDONE ER 6 MG PO TB24
6.0000 mg | ORAL_TABLET | Freq: Every day | ORAL | Status: DC
  Filled 2014-02-17: qty 1

## 2014-02-17 MED ORDER — POTASSIUM CHLORIDE 20 MEQ/15ML (10%) PO LIQD
40.0000 meq | Freq: Once | ORAL | Status: AC
  Administered 2014-02-17: 40 meq via ORAL
  Filled 2014-02-17: qty 30

## 2014-02-17 MED ORDER — TRAZODONE HCL 100 MG PO TABS: 100.0000 mg | ORAL_TABLET | Freq: Every day | ORAL | Status: DC

## 2014-02-17 MED ORDER — RISPERIDONE 1 MG PO TBDP
1.5000 mg | ORAL_TABLET | Freq: Every day | ORAL | Status: DC
  Filled 2014-02-17: qty 1

## 2014-02-17 MED ORDER — LORAZEPAM 1 MG PO TABS: 1.0000 mg | ORAL_TABLET | Freq: Three times a day (TID) | ORAL | Status: DC

## 2014-02-17 MED ORDER — GABAPENTIN 100 MG PO CAPS
100.0000 mg | ORAL_CAPSULE | Freq: Three times a day (TID) | ORAL | Status: DC
  Filled 2014-02-17 (×2): qty 1

## 2014-02-17 MED ORDER — HYDROCERIN EX CREA
TOPICAL_CREAM | Freq: Every day | CUTANEOUS | Status: DC | PRN
  Filled 2014-02-17: qty 113

## 2014-02-17 MED ORDER — RISPERIDONE 3 MG PO TABS: 1.5000 mg | ORAL_TABLET | Freq: Every day | ORAL | Status: DC

## 2014-02-17 MED ORDER — LORAZEPAM 1 MG PO TABS
1.0000 mg | ORAL_TABLET | Freq: Three times a day (TID) | ORAL | Status: DC
  Filled 2014-02-17: qty 1

## 2014-02-17 MED ORDER — AQUAPHOR EX OINT: TOPICAL_OINTMENT | CUTANEOUS | Status: DC | PRN

## 2014-02-17 MED ORDER — AQUAPHOR EX OINT
TOPICAL_OINTMENT | CUTANEOUS | Status: DC | PRN
  Filled 2014-02-17: qty 50

## 2014-02-17 MED ORDER — GABAPENTIN 100 MG PO CAPS: 100.0000 mg | ORAL_CAPSULE | Freq: Three times a day (TID) | ORAL | Status: DC

## 2014-02-17 MED ORDER — ESCITALOPRAM OXALATE 10 MG PO TABS
10.0000 mg | ORAL_TABLET | Freq: Every day | ORAL | Status: DC
  Filled 2014-02-17: qty 1

## 2014-02-17 MED ORDER — STERILE WATER FOR INJECTION IJ SOLN
INTRAMUSCULAR | Status: AC
  Administered 2014-02-17: 1.2 mL
  Filled 2014-02-17: qty 10

## 2014-02-17 MED ORDER — PALIPERIDONE ER 6 MG PO TB24: 6.0000 mg | ORAL_TABLET | Freq: Every day | ORAL | Status: DC

## 2014-02-17 NOTE — Consult Note (Signed)
[facility] Face-to-Face Psychiatry Consult   Reason for Consult:  Psychosis Referring Physician:  EDP Kaylee Kelley is an 68 y.o. female. Total Time spent with patient: 20 minutes  Assessment: AXIS I:  Schizoaffective Disorder AXIS II:  Deferred AXIS III:   Past Medical History  Diagnosis Date  . Hypertension   . Pre-diabetes   . Bipolar affective disorder   . Schizophrenic disorder    AXIS IV:  other psychosocial or environmental problems, problems related to social environment and problems with primary support group AXIS V:  61-70 mild symptoms  Plan:  No evidence of imminent risk to self or others at present.    Subjective:   Kaylee Kelley is a 68 y.o. female patient does not warrant admission.  HPI:  Patient came in to the ED yelling because she was in pain from her arthritis.  Her nieces are at her bedside and one lives with her; her caregiver.  Kaylee Kelley has schizoaffective disorder and goes to [facility].  She got confused on her appointments and does not think it is until 6/6.  Her niece needs to get her there Monday at 7:30 am so she can get her psychiatric medications.  She will go if there is written instructions.  The EDP was concerned about her yelling and thought it was due to her schizoaffective disorder.  Then, realized it was not the case.  Kaylee Kelley is calm and cooperative on exam.  She is agreeable to going to [place] on Monday and RX given to get her through the weekend.  Kaylee Kelley denies suicidal/homicidal ideations.  She hallucinates at times by talking to pictures of people at home but her nieces state this is her baseline.  They do not feel she is a danger to herself or others.  The nieces brought her to the ED because of a rash on her face. HPI Elements:   Location:  generalized. Quality:  acute. Severity:  mild. Timing:  intermittent. Duration:  brief. Context:  physical pain.  Past Psychiatric History: Past Medical History  Diagnosis Date  . Hypertension   .  Pre-diabetes   . Bipolar affective disorder   . Schizophrenic disorder     reports that she has never smoked. She does not have any smokeless tobacco history on file. She reports that she does not drink alcohol or use illicit drugs. Family History  Problem Relation Age of Onset  . Hypertension Mother   . Colon cancer Mother   . Diabetes Mother   . Hypertension Father    Family History Substance Abuse: No Family Supports: Yes, List: (neice and CNA) Living Arrangements: Other relatives Can pt return to current living arrangement?: Yes Abuse/Neglect [facility]) Physical Abuse: Denies Verbal Abuse: Denies Sexual Abuse: Denies Allergies:  No Known Allergies  ACT Assessment Complete:  Yes:    Educational Status    Risk to Self: Risk to self Suicidal Ideation: No Suicidal Intent: No Is patient at risk for suicide?: No Suicidal Plan?: No Access to Means: No What has been your use of drugs/alcohol within the last 12 months?: na Previous Attempts/Gestures: No How many times?: 0 Other Self Harm Risks: na Triggers for Past Attempts: None known Intentional Self Injurious Behavior: None Family Suicide History: No Recent stressful life event(s): Other (Comment) (off meds) Persecutory voices/beliefs?: Yes Depression: No Substance abuse history and/or treatment for substance abuse?: No Suicide prevention information given to non-admitted patients: Not applicable  Risk to Others: Risk to Others Homicidal Ideation: No Thoughts of Harm  to Others: No Current Homicidal Intent: No Current Homicidal Plan: No Access to Homicidal Means: No Identified Victim: na History of harm to others?: No Assessment of Violence: None Noted Violent Behavior Description: cooperative Does patient have access to weapons?: No Criminal Charges Pending?: No Does patient have a court date: No  Abuse: Abuse/Neglect Assessment (Assessment to be complete while patient is alone) Physical Abuse: Denies Verbal Abuse:  Denies Sexual Abuse: Denies Exploitation of patient/patient's resources: Denies Self-Neglect: Denies  Prior Inpatient Therapy: Prior Inpatient Therapy Prior Inpatient Therapy: Yes Prior Therapy Dates: 2014 Prior [facility] Fac[facility]Provider[facility]s): [facility] Reason for Treatment: off meds  Prior Outpatient Therapy: Prior Outpatient Therapy Prior Outpatient Therapy: Yes Prior Therapy Dates: current Prior Therapy Facilty/Provider(s): [facility] Reason for Treatment: med mgt  Additional Information: Additional Information 1:1 In Past 12 Months?: Yes CIRT Risk: No Elopement Risk: No Does patient have medical clearance?: No                  Objective: Blood pressure 106/73, pulse 95, temperature 98.8 F (37.1 C), temperature source Oral, resp. rate 18, SpO2 100.00%.There is no weight on file to calculate BMI. Results for orders placed during the hospital encounter of 02/17/14 (from the past 72 hour(s))  CBC WITH DIFFERENTIAL     Status: Abnormal   Collection Time    02/17/14 12:40 PM      Result Value Ref Range   WBC 8.1  4.0 - 10.5 K/uL   RBC 4.35  3.87 - 5.11 MIL/uL   Hemoglobin 12.0  12.0 - 15.0 g/dL   HCT 34.3 (*) 36.0 - 46.0 %   MCV 78.9  78.0 - 100.0 fL   MCH 27.6  26.0 - 34.0 pg   MCHC 35.0  30.0 - 36.0 g/dL   RDW 13.6  11.5 - 15.5 %   Platelets 216  150 - 400 K/uL   Neutrophils Relative % 60  43 - 77 %   Neutro Abs 4.8  1.7 - 7.7 K/uL   Lymphocytes Relative 32  12 - 46 %   Lymphs Abs 2.6  0.7 - 4.0 K/uL   Monocytes Relative 8  3 - 12 %   Monocytes Absolute 0.6  0.1 - 1.0 K/uL   Eosinophils Relative 1  0 - 5 %   Eosinophils Absolute 0.0  0.0 - 0.7 K/uL   Basophils Relative 0  0 - 1 %   Basophils Absolute 0.0  0.0 - 0.1 K/uL  BASIC METABOLIC PANEL     Status: Abnormal   Collection Time    02/17/14 12:40 PM      Result Value Ref Range   Sodium 140  137 - 147 mEq/L   Potassium 3.5 (*) 3.7 - 5.3 mEq/L   Chloride 98  96 - 112 mEq/L   CO2 30  19 - 32 mEq/L   Glucose,  Bld 161 (*) 70 - 99 mg/dL   BUN 15  6 - 23 mg/dL   Creatinine, Ser 0.82  0.50 - 1.10 mg/dL   Calcium 9.6  8.4 - 10.5 mg/dL   GFR calc non Af Amer 73 (*) >90 mL/min   GFR calc Af Amer 85 (*) >90 mL/min   Comment: (NOTE)     The eGFR has been calculated using the CKD EPI equation.     This calculation has not been validated in all clinical situations.     eGFR's persistently <90 mL/min signify possible Chronic Kidney     Disease.  ETHANOL  Status: None   Collection Time    02/17/14 12:40 PM      Result Value Ref Range   Alcohol, Ethyl (B) <11  0 - 11 mg/dL   Comment:            LOWEST DETECTABLE LIMIT FOR     SERUM ALCOHOL IS 11 mg/dL     FOR MEDICAL PURPOSES ONLY  SALICYLATE LEVEL     Status: Abnormal   Collection Time    02/17/14 12:40 PM      Result Value Ref Range   Salicylate Lvl <6.6 (*) 2.8 - 20.0 mg/dL  ACETAMINOPHEN LEVEL     Status: None   Collection Time    02/17/14 12:40 PM      Result Value Ref Range   Acetaminophen (Tylenol), Serum <15.0  10 - 30 ug/mL   Comment:            THERAPEUTIC CONCENTRATIONS VARY     SIGNIFICANTLY. A RANGE OF 10-30     ug/mL MAY BE AN EFFECTIVE     CONCENTRATION FOR MANY PATIENTS.     HOWEVER, SOME ARE BEST TREATED     AT CONCENTRATIONS OUTSIDE THIS     RANGE.     ACETAMINOPHEN CONCENTRATIONS     >150 ug/mL AT 4 HOURS AFTER     INGESTION AND >50 ug/mL AT 12     HOURS AFTER INGESTION ARE     OFTEN ASSOCIATED WITH TOXIC     REACTIONS.   Labs are reviewed and are pertinent for no medical issues noted.  Current Facility-Administered Medications  Medication Dose Route Frequency Provider Last Rate Last Dose  . escitalopram (LEXAPRO) tablet 10 mg  10 mg Oral Daily Waylan Boga, NP      . gabapentin (NEURONTIN) capsule 100 mg  100 mg Oral TID Waylan Boga, NP      . LORazepam (ATIVAN) tablet 1 mg  1 mg Oral TID Waylan Boga, NP      . mineral oil-hydrophilic petrolatum (AQUAPHOR) ointment   Topical PRN Merryl Hacker, MD      .  paliperidone (INVEGA) 24 hr tablet 6 mg  6 mg Oral Daily Waylan Boga, NP      . risperiDONE (RISPERDAL M-TABS) disintegrating tablet 1.5 mg  1.5 mg Oral QHS Waylan Boga, NP      . traZODone (DESYREL) tablet 100 mg  100 mg Oral QHS Waylan Boga, NP       Current Outpatient Prescriptions  Medication Sig Dispense Refill  . Valproic Acid (DEPAKENE) 250 MG/5ML SYRP syrup Take 20 mLs (1,000 mg total) by mouth at bedtime. For mood stability.  600 mL  0  . escitalopram (LEXAPRO) 10 MG tablet Take 1 tablet (10 mg total) by mouth daily.  30 tablet  0  . gabapentin (NEURONTIN) 100 MG capsule Take 1 capsule (100 mg total) by mouth 3 (three) times daily.  90 capsule  0  . hydrochlorothiazide (HYDRODIURIL) 25 MG tablet Take 1 tablet (25 mg total) by mouth daily.      . irbesartan (AVAPRO) 150 MG tablet Take 1 tablet (150 mg total) by mouth daily.      Marland Kitchen LORazepam (ATIVAN) 1 MG tablet Take 1 tablet (1 mg total) by mouth 3 (three) times daily.  30 tablet  0  . paliperidone (INVEGA) 6 MG 24 hr tablet Take 1 tablet (6 mg total) by mouth daily.  30 tablet  0  . potassium chloride (K-DUR,KLOR-CON) 10 MEQ tablet Take 1  tablet (10 mEq total) by mouth daily.      . risperiDONE (RISPERDAL) 3 MG tablet Take 0.5 tablets (1.5 mg total) by mouth at bedtime.  30 tablet  0  . traZODone (DESYREL) 100 MG tablet Take 1 tablet (100 mg total) by mouth at bedtime.  30 tablet  0    Psychiatric Specialty Exam:     Blood pressure 106/73, pulse 95, temperature 98.8 F (37.1 C), temperature source Oral, resp. rate 18, SpO2 100.00%.There is no weight on file to calculate BMI.  General Appearance: Casual  Eye Contact::  Fair  Speech:  Normal Rate  Volume:  Normal  Mood:  Anxious  Affect:  Congruent  Thought Process:  Coherent  Orientation:  Full (Time, Place, and Person)  Thought Content:  WDL  Suicidal Thoughts:  No  Homicidal Thoughts:  No  Memory:  Immediate;   Fair Recent;   Fair Remote;   Fair  Judgement:  Fair   Insight:  Fair  Psychomotor Activity:  Normal  Concentration:  Fair  Recall:  [employer]: Fair  Akathisia:  No  Handed:  Right  AIMS (if indicated):     Assets:  Financial Resources/Insurance Housing Leisure Time Resilience Social Support Transportation  Sleep:      Musculoskeletal: Strength & Muscle Tone: within normal limits Gait & Station: normal Patient leans: N/A  Treatment Plan Summary: Discharge with nieces to follow-up with [facility] on Monday (instructions typed for the patient's benefit), Rx given to bridge the time gap.  Waylan Boga, PMH-NP 02/17/2014 3:43 PM  Reviewed the information documented and agree with the treatment plan.  Parke Simmers Jed Kutch 02/18/2014 1:34 PM

## 2014-02-17 NOTE — BH Assessment (Signed)
Assessment Note  Kaylee Kelley is an 68 y.o. female.   Pt brought to ER by family due to not taking her medications and having a rash on her face and being in pain.  Pt denies suicidality, homicidality, and no hx of hurting self.  Family denies hx of suicidality and homicidality.  Pt does have long hx of schizoaffective disorder and [facility].  Pt has been involved in [facility] in the past.  Family believes placement in [facility] at this time is counter productive and pt needs to come home with them with medications prescribed.  Pt needs Intuniv, Trazodone.  Pt already has risperidone at home.  Pt needs to see a note from a doctor saying taking this medication and [facility] Monday and per family "She will do it.  But it has to be a doctor."    Staffed with Dr. Wilkie AyeHorton and she placed pt under IVC due to screeming upon arrival and what appeared to be psychotic behaviors.  MD wants psychiatry to evaluate pt and if psychiatry recommends rescinding IVC she is open to doing so but wants collaboration.    Axis I: Schizoaffective Disorder Axis II: Deferred Axis III:  Past Medical History  Diagnosis Date  . Hypertension   . Pre-diabetes   . Bipolar affective disorder   . Schizophrenic disorder    Axis IV: other psychosocial or environmental problems Axis V: 41-50 serious symptoms  Past Medical History:  Past Medical History  Diagnosis Date  . Hypertension   . Pre-diabetes   . Bipolar affective disorder   . Schizophrenic disorder     No past surgical history on file.  Family History:  Family History  Problem Relation Age of Onset  . Hypertension Mother   . Colon cancer Mother   . Diabetes Mother   . Hypertension Father     Social History:  reports that she has never smoked. She does not have any smokeless tobacco history on file. She reports that she does not drink alcohol or use illicit drugs.  Additional Social History:  Alcohol / Drug  Use Pain Medications: na Prescriptions: na Over the Counter: na History of alcohol / drug use?: No history of alcohol / drug abuse  CIWA: CIWA-Ar BP: 106/73 mmHg Pulse Rate: 95 COWS:    Allergies: No Known Allergies  Home Medications:  (Not in a hospital admission)  OB/GYN Status:  No LMP recorded. Patient is postmenopausal.  General Assessment Data Location of Assessment: [facility] ED Is this a Tele or Face-to-Face Assessment?: Face-to-Face Is this an Initial Assessment or a Re-assessment for this encounter?: Initial Assessment Living Arrangements: Other relatives Can pt return to current living arrangement?: Yes Admission Status: Involuntary Is patient capable of signing voluntary admission?: No Transfer from: Acute Hospital Referral Source: MD  Medical Screening Exam [facility] Walk-in ONLY) Medical Exam completed: Yes  [facility] Living Arrangements: Other relatives Name of Psychiatrist: [facility] Name of Therapist: na  Education Status Is patient currently in school?: No Current Grade: na Highest grade of school patient has completed: na Name of school: na Contact person: na  Risk to self Suicidal Ideation: No Suicidal Intent: No Is patient at risk for suicide?: No Suicidal Plan?: No Access to Means: No What has been your use of drugs/alcohol within the last 12 months?: na Previous Attempts/Gestures: No How many times?: 0 Other Self Harm Risks: na Triggers for Past Attempts: None known Intentional Self  Injurious Behavior: None Family Suicide History: No Recent stressful life event(s): Other (Comment) (off meds) Persecutory voices/beliefs?: Yes Depression: No Substance abuse history and/or treatment for substance abuse?: No Suicide prevention information given to non-admitted patients: Not applicable  Risk to Others Homicidal Ideation: No Thoughts of Harm to Others: No Current Homicidal Intent: No Current Homicidal Plan: No Access to Homicidal Means:  No Identified Victim: na History of harm to others?: No Assessment of Violence: None Noted Violent Behavior Description: cooperative Does patient have access to weapons?: No Criminal Charges Pending?: No Does patient have a court date: No  Psychosis Hallucinations: Auditory Delusions: Unspecified  Mental Status Report Appear/Hygiene: Disheveled Eye Contact: Fair Motor Activity: Agitation Speech: Loud;Incoherent Level of Consciousness: Alert Mood: Anxious;Irritable Affect: Irritable;Anxious Anxiety Level: Minimal Thought Processes: Relevant;Tangential Judgement: Impaired (pt has long hx of schizophrenia and off meds) Orientation: Person;Time;Place Obsessive Compulsive Thoughts/Behaviors: Minimal  Cognitive Functioning Concentration: Decreased Memory: Recent Intact;Remote Intact IQ: Average Insight: Poor Impulse Control: Poor Appetite: Fair Weight Loss: 0 Weight Gain: 0 Sleep: Decreased Total Hours of Sleep: 4 Vegetative Symptoms: None  ADLScreening [facility] Assessment Services) Patient's cognitive ability adequate to safely complete daily activities?: Yes Patient able to express need for assistance with ADLs?: Yes Independently performs ADLs?: Yes (appropriate for developmental age)  Prior Inpatient Therapy Prior Inpatient Therapy: Yes Prior Therapy Dates: 2014 [facility] Fac[facility]Provider[facility] Reason for Treatment: off meds  Prior Outpatient Therapy Prior Outpatient Therapy: Yes Prior Therapy Dates: current Prior Therapy Facilty/Provider(s): [facility] Reason for Treatment: med mgt  ADL Screening (condition at time of admission) Patient's cognitive ability adequate to safely complete daily activities?: Yes Is the patient deaf or have difficulty hearing?: No Does the patient have difficulty seeing, even when wearing glasses/contacts?: No Does the patient have difficulty concentrating, remembering, or making decisions?: Yes Patient able to express need for  assistance with ADLs?: Yes Does the patient have difficulty dressing or bathing?: Yes Independently performs ADLs?: Yes (appropriate for developmental age) Does the patient have difficulty walking or climbing stairs?: No Weakness of Legs: None Weakness of Arms/Hands: None  Home Assistive Devices/Equipment Home Assistive Devices/Equipment: None  Therapy Consults (therapy consults require a physician order) PT Evaluation Needed: No OT Evalulation Needed: No SLP Evaluation Needed: No Abuse/Neglect Assessment (Assessment to be complete while patient is alone) Physical Abuse: Denies Verbal Abuse: Denies Sexual Abuse: Denies Exploitation of patient/patient's resources: Denies Self-Neglect: Denies Values / Beliefs Cultural Requests During Hospitalization: None Spiritual Requests During Hospitalization: None Consults Spiritual Care Consult Needed: No Social Work Consult Needed: No Merchant navy officerAdvance Directives (For Healthcare) Advance Directive: Patient does not have advance directive Pre-existing out of facility DNR order (yellow form or pink MOST form): No    Additional Information 1:1 In Past 12 Months?: Yes CIRT Risk: No Elopement Risk: No Does patient have medical clearance?: No     Disposition:  Disposition Initial Assessment Completed for this Encounter: Yes Disposition of Patient: Referred to (psychiatry to evaluate) Patient referred to: Other (Comment) (psychiatry to follow up)  On Site Evaluation by:   Reviewed with Physician:    Macon Largeobert Andrew Koray Soter Jr. 02/17/2014 2:22 PM

## 2014-02-17 NOTE — ED Notes (Signed)
Pt hysterical in triage, screaming at the top of her lungs.  Unable to understand what she is saying.  Niece states that rash on face and neck started Thursday.  Pt pointing to her feet and screaming.  Pt mumbling very fast.  Unable to understand what she is saying.  Niece states pt has hx of arthritis.  Pt thinks people are hitting her and smells gas.

## 2014-02-17 NOTE — Discharge Instructions (Signed)
Rash A rash is a change in the color or texture of your skin. There are many different types of rashes. You may have other problems that accompany your rash.  Apply aquaphor and follow-up with PCP regarding rash.  CAUSES   Infections.  Allergic reactions. This can include allergies to pets or foods.  Certain medicines.  Exposure to certain chemicals, soaps, or cosmetics.  Heat.  Exposure to poisonous plants.  Tumors, both cancerous and noncancerous. SYMPTOMS   Redness.  Scaly skin.  Itchy skin.  Dry or cracked skin.  Bumps.  Blisters.  Pain. DIAGNOSIS  Your caregiver may do a physical exam to determine what type of rash you have. A skin sample (biopsy) may be taken and examined under a microscope. TREATMENT  Treatment depends on the type of rash you have. Your caregiver may prescribe certain medicines. For serious conditions, you may need to see a skin doctor (dermatologist). HOME CARE INSTRUCTIONS   Avoid the substance that caused your rash.  Do not scratch your rash. This can cause infection.  You may take cool baths to help stop itching.  Only take over-the-counter or prescription medicines as directed by your caregiver.  Keep all follow-up appointments as directed by your caregiver. SEEK IMMEDIATE MEDICAL CARE IF:  You have increasing pain, swelling, or redness.  You have a fever.  You have new or severe symptoms.  You have body aches, diarrhea, or vomiting.  Your rash is not better after 3 days. MAKE SURE YOU:  Understand these instructions.  Will watch your condition.  Will get help right away if you are not doing well or get worse. Document Released: 09/25/2002 Document Revised: 12/28/2011 Document Reviewed: 07/20/2011 [facility] Patient Information 2014 [employer], [place]. Schizoaffective Disorder Schizoaffective disorder (ScAD) is a mental illness. It causes symptoms that are a mixture of schizophrenia (a psychotic disorder) and an affective  (mood) disorder. The schizophrenic symptoms may include delusions, hallucinations, or odd behavior. The mood symptoms may be similar to major depression or bipolar disorder. ScAD may interfere with personal relationships or normal daily activities. People with ScAD are at increased risk for job loss, social isolation,physical health problems, anxiety and substance use disorders, and suicide. ScAD usually occurs in cycles. Periods of severe symptoms are followed by periods of less severe symptoms or improvement. The illness affects men and women equally but usually appears at an earlier age (teenage or early adult years) in men. People who have family members with schizophrenia, bipolar disorder, or ScAD are at higher risk of developing ScAD. SYMPTOMS  At any one time, people with ScAD may have psychotic symptoms only or both psychotic and mood symptoms. The psychotic symptoms include one or more of the following:  Hearing, seeing, or feeling things that are not there (hallucinations).   Having fixed, false beliefs (delusions). The delusions usually are of being attacked, harassed, cheated, persecuted, or conspired against (paranoid delusions).  Speaking in a way that makes no sense to others (disorganized speech). The psychotic symptoms of ScAD may also include confusing or odd behavior or any of the negative symptoms of schizophrenia. These include loss of motivation for normal daily activities, such as bathing or grooming, withdrawal from other people, and lack of emotions.  The mood symptoms of ScAD occur more often than not. They resemble major depressive disorder or bipolar mania. Symptoms of major depression include depressed mood and four or more of the following:  Loss of interest in usually pleasurable activities (anhedonia).  Sleeping more or less than  normal.  Feeling worthless or excessively guilty.  Lack of energy or motivation.  Trouble concentrating.  Eating more or less  than usual.  Thinking a lot about death or suicide. Symptoms of bipolar mania include abnormally elevated or irritable mood and increased energy or activity, plus three or more of the following:   More confidence than normal or feeling that you are able to do anything (grandiosity).  Feeling rested with less sleep than normal.   Being easily distracted.   Talking more than usual or feeling pressured to keep talking.   Feeling that your thoughts are racing.  Engaging in high-risk activities such as buying sprees or foolish business decisions. DIAGNOSIS  ScAD is diagnosed through an assessment by your health care provider. Your health care provider will observe and ask questions about your thoughts, behavior, mood, and ability to function in daily life. Your health care provider may also ask questions about your medical history and use of drugs, including prescription medicines. Your health care provider may also order blood tests and imaging exams. Certain medical conditions and substances can cause symptoms that resemble ScAD. Your health care provider may refer you to a mental health specialist for evaluation.  ScAD is divided into two types. The depressive type is diagnosed if your mood symptoms are limited to major depression. The bipolar type is diagnosed if your mood symptoms are manic or a mixture of manic and depressive symptoms TREATMENT  ScAD is usually a life-long illness. Long-term treatment is necessary. The following treatments are available:  Medicine Different types of medicine are used to treat ScAD. The exact combination depends on the type and severity of your symptoms. Antipsychotic medicine is used to control psychotic symptomssuch as delusions, paranoia, and hallucinations. Mood stabilizers can even the highs and lows of bipolar manic mood swings. Antidepressant medicines are used to treat major depressive symptoms.  Counseling or talk therapy Individual, group, or  family counseling may be helpful in providing education, support, and guidance. Many people with ScAD also benefit from social skills and job skills (vocational) training. A combination of medicine and counseling is usually best for managing the disorder over time. A procedure in which electricity is applied to the brain through the scalp (electroconvulsive therapy) may be used to treat people with severe manic symptoms that do not respond to medicine and counseling. HOME CARE INSTRUCTIONS   Take all your medicine as prescribed.  Check with your health care provider before starting new prescription or over-the-counter medicines.  Keep all follow up appointments with your health care provider. SEEK MEDICAL CARE IF:   If you are not able to take your medicines as prescribed.  If your symptoms get worse. SEEK IMMEDIATE MEDICAL CARE IF:   You have serious thoughts about hurting yourself or others. Document Released: 02/15/2007 Document Revised: 07/26/2013 Document Reviewed: 05/19/2013 [facility] Patient Information 2014 [place], [place].

## 2014-02-17 NOTE — [facility] Suicide Risk Assessment (Signed)
Suicide Risk Assessment  Discharge Assessment     Demographic Factors:  Age 66 or older  Total Time spent with patient: 20 minutes  Psychiatric Specialty Exam:     Blood pressure 106/73, pulse 95, temperature 98.8 F (37.1 C), temperature source Oral, resp. rate 18, SpO2 100.00%.There is no weight on file to calculate BMI.  General Appearance: Casual  Eye Contact::  Fair  Speech:  Normal Rate  Volume:  Normal  Mood:  Anxious  Affect:  Congruent  Thought Process:  Coherent  Orientation:  Full (Time, Place, and Person)  Thought Content:  WDL  Suicidal Thoughts:  No  Homicidal Thoughts:  No  Memory:  Immediate;   Fair Recent;   Fair Remote;   Fair  Judgement:  Fair  Insight:  Fair  Psychomotor Activity:  Normal  Concentration:  Fair  Recall:  FiservFair  Fund of Knowledge:Fair  Language: Fair  Akathisia:  No  Handed:  Right  AIMS (if indicated):     Assets:  Health and safety inspectorinancial Resources/Insurance Housing Leisure Time Resilience Social Support Transportation  Sleep:      Musculoskeletal: Strength & Muscle Tone: within normal limits Gait & Station: normal Patient leans: N/A  Mental Status Per Nursing Assessment::   On Admission:     Current Mental Status by Physician: NA  Loss Factors: NA  Historical Factors: NA  Risk Reduction Factors:   Responsible for children under 818 years of age, Sense of responsibility to family, Religious beliefs about death, Living with another person, especially a relative, Positive social support, Positive therapeutic relationship and Positive coping skills or problem solving skills  Continued Clinical Symptoms:  Schizoaffective disorder, anxiety  Cognitive Features That Contribute To Risk:  None  Suicide Risk:  Minimal: No identifiable suicidal ideation.  Patients presenting with no risk factors but with morbid ruminations; may be classified as minimal risk based on the severity of the depressive symptoms  Discharge Diagnoses:    AXIS I:  Schizoaffective Disorder AXIS II:  Deferred AXIS III:   Past Medical History  Diagnosis Date  . Hypertension   . Pre-diabetes   . Bipolar affective disorder   . Schizophrenic disorder    AXIS IV:  other psychosocial or environmental problems, problems related to social environment and problems with primary support group AXIS V:  61-70 mild symptoms  Plan Of Care/Follow-up recommendations:  Activity:  as tolerated Diet:  low-sodium heart healthy diet  Is patient on multiple antipsychotic therapies at discharge:  No   Has Patient had three or more failed trials of antipsychotic monotherapy by history:  No  Recommended Plan for Multiple Antipsychotic Therapies: NA    Nanine MeansJamison Lord, PMH-NP 02/17/2014, 3:56 PM

## 2014-02-17 NOTE — ED Provider Notes (Signed)
CSN: [id number]     Arrival date & time 02/17/14  1149 History   First MD Initiated Contact with Patient 02/17/14 1203     Chief Complaint  Patient presents with  . Rash  . Joint Pain     (Consider location/radiation/quality/duration/timing/severity/associated sxs/prior Treatment) HPI  This is a 68 year old female with history of hypertension and prediabetes, bipolar disorder, and schizophrenic disorder who presents with rash. On initial evaluation, patient is hysterical. She is screaming at the top of her lungs. She's not directable. She is noncontributory to history taking. Patient's niece is at the bedside. She is her primary caregiver. She states that her aunt is off her medications. She is also refused her last 2 visits to [facility]. She reports over the last 2 days she's noted a rash over the face. She is concerned as she does not know what this is.  She denies any recent exposures. No known fevers.  I discussed at length with the niece my concern over her aunt's psychological state. She appears to be acutely psychotic. She is responding to internal stimuli and I feel she is a harm to herself. IVC paperwork was initiated.  Past Medical History  Diagnosis Date  . Hypertension   . Pre-diabetes   . Bipolar affective disorder   . Schizophrenic disorder    History reviewed. No pertinent past surgical history. Family History  Problem Relation Age of Onset  . Hypertension Mother   . Colon cancer Mother   . Diabetes Mother   . Hypertension Father    History  Substance Use Topics  . Smoking status: Never Smoker   . Smokeless tobacco: Not on file  . Alcohol Use: No     Comment: Pt denies    OB History   Grav Para Term Preterm Abortions TAB SAB Ect Mult Living                 Review of Systems  Unable to perform ROS: Mental status change      Allergies  Review of patient's allergies indicates no known allergies.  Home Medications   Prior to Admission medications    Medication Sig Start Date End Date Taking? Authorizing Provider  Valproic Acid (DEPAKENE) 250 MG/5ML SYRP syrup Take 20 mLs (1,000 mg total) by mouth at bedtime. For mood stability. 03/21/13  Yes Verne Spurr, PA-C  escitalopram (LEXAPRO) 10 MG tablet Take 1 tablet (10 mg total) by mouth daily. 02/17/14   Nanine Means, NP  gabapentin (NEURONTIN) 100 MG capsule Take 1 capsule (100 mg total) by mouth 3 (three) times daily. 02/17/14   Nanine Means, NP  hydrochlorothiazide (HYDRODIURIL) 25 MG tablet Take 1 tablet (25 mg total) by mouth daily. 02/17/14   Nanine Means, NP  irbesartan (AVAPRO) 150 MG tablet Take 1 tablet (150 mg total) by mouth daily. 02/17/14   Nanine Means, NP  LORazepam (ATIVAN) 1 MG tablet Take 1 tablet (1 mg total) by mouth 3 (three) times daily. 02/17/14   Nanine Means, NP  paliperidone (INVEGA) 6 MG 24 hr tablet Take 1 tablet (6 mg total) by mouth daily. 02/17/14   Nanine Means, NP  potassium chloride (K-DUR,KLOR-CON) 10 MEQ tablet Take 1 tablet (10 mEq total) by mouth daily. 02/17/14   Nanine Means, NP  risperiDONE (RISPERDAL) 3 MG tablet Take 0.5 tablets (1.5 mg total) by mouth at bedtime. 02/17/14   Nanine Means, NP  traZODone (DESYREL) 100 MG tablet Take 1 tablet (100 mg total) by mouth at bedtime. 02/17/14  Nanine MeansJamison Lord, NP   BP 106/73  Pulse 95  Temp(Src) 98.8 F (37.1 C) (Oral)  Resp 18  SpO2 100% Physical Exam  Nursing note and vitals reviewed. Constitutional:  Yelling, mumbling very quickly, incomprehensible speech  HENT:  Head: Normocephalic and atraumatic.  Mouth/Throat: Oropharynx is clear and moist.  No mucosal lesions noted  Eyes: Pupils are equal, round, and reactive to light.  Neck: Neck supple.  Cardiovascular: Normal rate, regular rhythm and normal heart sounds.   No murmur heard. Pulmonary/Chest: Effort normal and breath sounds normal. No respiratory distress. She has no wheezes.  Abdominal: Soft. There is no rebound.  Neurological: She is alert.  Moves all 4  extremities, will not participate further in neurologic exam  Skin: Skin is warm and dry.  Scaling rash over the bilateral cheeks extending into the anterior neck and cervical region, no associated erythema, no drainage, no vesicles noted, no mucous membrane involvement, rash limited to the face and neck, no palm or sole involvement  Psychiatric: She has a normal mood and affect.    ED Course  Procedures (including critical care time) Labs Review Labs Reviewed  CBC WITH DIFFERENTIAL - Abnormal; Notable for the following:    HCT 34.3 (*)    All other components within normal limits  BASIC METABOLIC PANEL - Abnormal; Notable for the following:    Potassium 3.5 (*)    Glucose, Bld 161 (*)    GFR calc non Af Amer 73 (*)    GFR calc Af Amer 85 (*)    All other components within normal limits  SALICYLATE LEVEL - Abnormal; Notable for the following:    Salicylate Lvl <2.0 (*)    All other components within normal limits  ETHANOL  ACETAMINOPHEN LEVEL  URINE RAPID DRUG SCREEN (HOSP PERFORMED)    Imaging Review No results found.   EKG Interpretation   Date/Time:  Saturday Feb 17 2014 12:27:01 EDT Ventricular Rate:  77 PR Interval:  123 QRS Duration: 83 QT Interval:  344 QTC Calculation: 389 R Axis:   66 Text Interpretation:  Sinus rhythm Left ventricular hypertrophy  Nonspecific T abnormalities, diffuse leads Confirmed by HORTON  MD,  COURTNEY ([id number]) on 02/17/2014 12:31:26 PM      MDM   Final diagnoses:  Schizophrenia, paranoid type  RASH  Patient presents with acute psychosis and rash. While nontoxic-appearing she does appear to be coming to internal stimuli and was not redirectable. Patient was given Geodon with improvement of her mental status. Regarding patient's rash, I am unsure of the etiology of his rash. Appears almost like a peeling sunburn especially given the distribution; however, the niece denies any recent sun exposure. Aquaphor was applied to moisten the area  and prevent patient from scratching. There does not appear to be any infection. Low suspicion for Stevens-Johnson and no mucous membrane involvement. Discussed with the niece the need to followup with PCP for dermatology referral.  IVC paperwork placed on the patient given acute psychosis. I discussed this with the niece; however, she is very upset that paperwork has been taking on her aunt. I discussed with the niece that I am concerned that her ASA flight risk and need acute help for psychiatric stabilization. I have requested psychiatry to evaluate the patient. Patient was evaluated by psychiatry following Geodon administration and patient is much more cooperative. Per the patient and her aunt, they're willing to followup with [facility] at 7:30 AM on Monday. Psychiatry provided medications for the patient. IVC paperwork rescinded.  Shon Batonourtney F Horton, MD 02/18/14 [phone]

## 2014-02-20 DIAGNOSIS — F209 Schizophrenia, unspecified: Secondary | ICD-10-CM | POA: Diagnosis not present

## 2014-03-28 DIAGNOSIS — F209 Schizophrenia, unspecified: Secondary | ICD-10-CM | POA: Diagnosis not present

## 2014-04-02 DIAGNOSIS — D638 Anemia in other chronic diseases classified elsewhere: Secondary | ICD-10-CM | POA: Diagnosis not present

## 2014-04-02 DIAGNOSIS — E1149 Type 2 diabetes mellitus with other diabetic neurological complication: Secondary | ICD-10-CM | POA: Diagnosis not present

## 2014-04-02 DIAGNOSIS — I10 Essential (primary) hypertension: Secondary | ICD-10-CM | POA: Diagnosis not present

## 2014-04-02 DIAGNOSIS — E1049 Type 1 diabetes mellitus with other diabetic neurological complication: Secondary | ICD-10-CM | POA: Diagnosis not present

## 2014-04-27 DIAGNOSIS — F209 Schizophrenia, unspecified: Secondary | ICD-10-CM | POA: Diagnosis not present

## 2014-05-04 ENCOUNTER — Encounter (HOSPITAL_COMMUNITY): Payer: Self-pay | Admitting: Emergency Medicine

## 2014-05-04 ENCOUNTER — Ambulatory Visit (HOSPITAL_COMMUNITY)
Admission: RE | Admit: 2014-05-04 | Discharge: 2014-05-04 | Disposition: A | Discharge status: Home / Self Care | Payer: Medicare Other | Attending: Psychiatry | Admitting: Psychiatry

## 2014-05-04 ENCOUNTER — Emergency Department (HOSPITAL_COMMUNITY)
Admission: EM | Admit: 2014-05-04 | Discharge: 2014-05-07 | Disposition: A | Discharge status: Home / Self Care | Payer: Medicare Other | Attending: Emergency Medicine | Admitting: Emergency Medicine

## 2014-05-04 DIAGNOSIS — F3130 Bipolar disorder, current episode depressed, mild or moderate severity, unspecified: Secondary | ICD-10-CM | POA: Insufficient documentation

## 2014-05-04 DIAGNOSIS — Z79899 Other long term (current) drug therapy: Secondary | ICD-10-CM | POA: Diagnosis not present

## 2014-05-04 DIAGNOSIS — F29 Unspecified psychosis not due to a substance or known physiological condition: Secondary | ICD-10-CM | POA: Diagnosis not present

## 2014-05-04 DIAGNOSIS — F449 Dissociative and conversion disorder, unspecified: Secondary | ICD-10-CM

## 2014-05-04 DIAGNOSIS — F200 Paranoid schizophrenia: Secondary | ICD-10-CM | POA: Insufficient documentation

## 2014-05-04 DIAGNOSIS — F259 Schizoaffective disorder, unspecified: Secondary | ICD-10-CM | POA: Diagnosis not present

## 2014-05-04 DIAGNOSIS — I10 Essential (primary) hypertension: Secondary | ICD-10-CM | POA: Insufficient documentation

## 2014-05-04 DIAGNOSIS — F308 Other manic episodes: Secondary | ICD-10-CM | POA: Insufficient documentation

## 2014-05-04 DIAGNOSIS — F250 Schizoaffective disorder, bipolar type: Secondary | ICD-10-CM

## 2014-05-04 DIAGNOSIS — F309 Manic episode, unspecified: Secondary | ICD-10-CM | POA: Diagnosis present

## 2014-05-04 LAB — URINALYSIS, ROUTINE W REFLEX MICROSCOPIC
Bilirubin Urine: NEGATIVE
GLUCOSE, UA: NEGATIVE mg/dL
KETONES UR: NEGATIVE mg/dL
Nitrite: NEGATIVE
Protein, ur: 30 mg/dL — AB
Specific Gravity, Urine: 1.015 (ref 1.005–1.030)
Urobilinogen, UA: 1 mg/dL (ref 0.0–1.0)
pH: 5 (ref 5.0–8.0)

## 2014-05-04 LAB — CBC
HCT: 32.2 % — ABNORMAL LOW (ref 36.0–46.0)
HEMOGLOBIN: 10.9 g/dL — AB (ref 12.0–15.0)
MCH: 27.3 pg (ref 26.0–34.0)
MCHC: 33.9 g/dL (ref 30.0–36.0)
MCV: 80.7 fL (ref 78.0–100.0)
PLATELETS: 207 10*3/uL (ref 150–400)
RBC: 3.99 MIL/uL (ref 3.87–5.11)
RDW: 14.5 % (ref 11.5–15.5)
WBC: 7.3 10*3/uL (ref 4.0–10.5)

## 2014-05-04 LAB — RAPID URINE DRUG SCREEN, HOSP PERFORMED
Amphetamines: NOT DETECTED
Barbiturates: NOT DETECTED
Benzodiazepines: NOT DETECTED
Cocaine: NOT DETECTED
Opiates: NOT DETECTED
Tetrahydrocannabinol: NOT DETECTED

## 2014-05-04 LAB — URINE MICROSCOPIC-ADD ON

## 2014-05-04 LAB — COMPREHENSIVE METABOLIC PANEL
ALT: 8 U/L (ref 0–35)
AST: 17 U/L (ref 0–37)
Albumin: 3.7 g/dL (ref 3.5–5.2)
Alkaline Phosphatase: 81 U/L (ref 39–117)
Anion gap: 12 (ref 5–15)
BUN: 14 mg/dL (ref 6–23)
CALCIUM: 9.7 mg/dL (ref 8.4–10.5)
CO2: 28 mEq/L (ref 19–32)
Chloride: 99 mEq/L (ref 96–112)
Creatinine, Ser: 1.13 mg/dL — ABNORMAL HIGH (ref 0.50–1.10)
GFR calc non Af Amer: 50 mL/min — ABNORMAL LOW (ref >90)
GFR, EST AFRICAN AMERICAN: 58 mL/min — AB (ref >90)
GLUCOSE: 104 mg/dL — AB (ref 70–99)
Potassium: 3.5 mEq/L — ABNORMAL LOW (ref 3.7–5.3)
Sodium: 139 mEq/L (ref 137–147)
Total Bilirubin: 0.4 mg/dL (ref 0.3–1.2)
Total Protein: 7.9 g/dL (ref 6.0–8.3)

## 2014-05-04 LAB — ETHANOL

## 2014-05-04 LAB — ACETAMINOPHEN LEVEL: Acetaminophen (Tylenol), Serum: <15 ug/mL (ref 10–30)

## 2014-05-04 LAB — SALICYLATE LEVEL

## 2014-05-04 LAB — VALPROIC ACID LEVEL

## 2014-05-04 MED ORDER — TRAZODONE HCL 100 MG PO TABS
100.0000 mg | ORAL_TABLET | Freq: Every day | ORAL | Status: DC
  Administered 2014-05-04 – 2014-05-06 (×3): 100 mg via ORAL
  Filled 2014-05-04 (×3): qty 1

## 2014-05-04 MED ORDER — ACETAMINOPHEN 325 MG PO TABS: 650.0000 mg | ORAL_TABLET | ORAL | Status: DC | PRN

## 2014-05-04 MED ORDER — RISPERIDONE 1 MG PO TABS: 1.5000 mg | ORAL_TABLET | Freq: Every day | ORAL | Status: DC

## 2014-05-04 MED ORDER — VALPROIC ACID 250 MG/5ML PO SYRP
1000.0000 mg | ORAL_SOLUTION | Freq: Every day | ORAL | Status: DC
  Administered 2014-05-04 – 2014-05-06 (×3): 1000 mg via ORAL
  Filled 2014-05-04 (×7): qty 20

## 2014-05-04 MED ORDER — RISPERIDONE 1 MG PO TABS
1.5000 mg | ORAL_TABLET | Freq: Every day | ORAL | Status: DC
  Administered 2014-05-04 – 2014-05-06 (×3): 1.5 mg via ORAL
  Filled 2014-05-04 (×6): qty 1

## 2014-05-04 MED ORDER — ALUM & MAG HYDROXIDE-SIMETH 200-200-20 MG/5ML PO SUSP: 30.0000 mL | ORAL | Status: DC | PRN

## 2014-05-04 MED ORDER — PALIPERIDONE ER 6 MG PO TB24
6.0000 mg | ORAL_TABLET | Freq: Every day | ORAL | Status: DC
  Administered 2014-05-04 – 2014-05-07 (×4): 6 mg via ORAL
  Filled 2014-05-04 (×6): qty 1

## 2014-05-04 MED ORDER — ADULT MULTIVITAMIN W/MINERALS CH
1.0000 | ORAL_TABLET | Freq: Every day | ORAL | Status: DC
  Administered 2014-05-04 – 2014-05-07 (×4): 1 via ORAL
  Filled 2014-05-04 (×4): qty 1

## 2014-05-04 MED ORDER — ESCITALOPRAM OXALATE 10 MG PO TABS: 10.0000 mg | ORAL_TABLET | Freq: Every day | ORAL | Status: DC

## 2014-05-04 MED ORDER — POTASSIUM CHLORIDE CRYS ER 10 MEQ PO TBCR
10.0000 meq | EXTENDED_RELEASE_TABLET | Freq: Every day | ORAL | Status: DC
  Administered 2014-05-04 – 2014-05-07 (×4): 10 meq via ORAL
  Filled 2014-05-04: qty 0.5
  Filled 2014-05-04: qty 1
  Filled 2014-05-04 (×2): qty 0.5

## 2014-05-04 MED ORDER — ONDANSETRON HCL 4 MG PO TABS: 4.0000 mg | ORAL_TABLET | Freq: Three times a day (TID) | ORAL | Status: DC | PRN

## 2014-05-04 MED ORDER — HYDROCHLOROTHIAZIDE 25 MG PO TABS
25.0000 mg | ORAL_TABLET | Freq: Every day | ORAL | Status: DC
  Administered 2014-05-04 – 2014-05-07 (×4): 25 mg via ORAL
  Filled 2014-05-04 (×4): qty 1

## 2014-05-04 MED ORDER — IRBESARTAN 150 MG PO TABS
150.0000 mg | ORAL_TABLET | Freq: Every day | ORAL | Status: DC
  Administered 2014-05-04 – 2014-05-07 (×4): 150 mg via ORAL
  Filled 2014-05-04 (×4): qty 1

## 2014-05-04 MED ORDER — SERTRALINE HCL 50 MG PO TABS
50.0000 mg | ORAL_TABLET | Freq: Every day | ORAL | Status: DC
  Administered 2014-05-04 – 2014-05-07 (×4): 50 mg via ORAL
  Filled 2014-05-04 (×4): qty 1

## 2014-05-04 MED ORDER — LORAZEPAM 1 MG PO TABS
1.0000 mg | ORAL_TABLET | Freq: Three times a day (TID) | ORAL | Status: DC
  Administered 2014-05-04 – 2014-05-07 (×10): 1 mg via ORAL
  Filled 2014-05-04 (×10): qty 1

## 2014-05-04 MED ORDER — LORAZEPAM 1 MG PO TABS: 1.0000 mg | ORAL_TABLET | Freq: Three times a day (TID) | ORAL | Status: DC | PRN

## 2014-05-04 MED ORDER — GABAPENTIN 100 MG PO CAPS
100.0000 mg | ORAL_CAPSULE | Freq: Three times a day (TID) | ORAL | Status: DC
  Filled 2014-05-04 (×2): qty 1

## 2014-05-04 MED ORDER — HALOPERIDOL LACTATE 5 MG/ML IJ SOLN
5.0000 mg | Freq: Once | INTRAMUSCULAR | Status: AC
  Administered 2014-05-04: 5 mg via INTRAMUSCULAR
  Filled 2014-05-04: qty 1

## 2014-05-04 NOTE — ED Notes (Signed)
Pt given scrubs and socks and asked to change clothes

## 2014-05-04 NOTE — [facility] Assessment (Signed)
[facility] Patient IV[facility] by MD Dub MikesLugo, [employer] [facility]

## 2014-05-04 NOTE — ED Notes (Signed)
TTS team at pt bedside

## 2014-05-04 NOTE — ED Notes (Signed)
Pt ambulated to [facility] w/o assistance  

## 2014-05-04 NOTE — ED Notes (Signed)
Per niece, pt with schizophrenia.  Pt has had schizophrenia with on/off manic behavior for years.  Pt has not taken meds yesterday and today.  Pt is screaming in triage and throwing drinks on family member.  Cannot get full assessment from pt.   Niece states she was talking to others this morning and seeing things. She is paranoid regarding treatment

## 2014-05-04 NOTE — ED Notes (Signed)
Bed: WA16 Expected date:  Expected time:  Means of arrival:  Comments: Triage 4 

## 2014-05-04 NOTE — Consult Note (Signed)
[facility] Consult   Reason for Consult:  Psychosis, aggressive behavior Referring Physician:  EDP  Daneille Desilva is an 68 y.o. female. Total Time spent with patient: 20 minutes  Assessment: AXIS I:  Schizoaffective Disorder AXIS II:  Deferred AXIS III:   Past Medical History  Diagnosis Date  . Hypertension   . Pre-diabetes   . Bipolar affective disorder   . Schizophrenic disorder    AXIS IV:  other psychosocial or environmental problems and problems related to social environment AXIS V:  21-30 behavior considerably influenced by delusions or hallucinations OR serious impairment in judgment, communication OR inability to function in almost all areas  Plan:  Recommend psychiatric Inpatient admission when medically cleared.  Subjective:   Gavin Faivre is a 68 y.o. female patient admitted with acute psychosis and aggressive behavior.  HPI:  Ms. Huyett is a 68 year old african american female who presented to the [facility] as a walk in patient due to an exacerbation of psychosis and mania.  Patient became increasingly agitated during the mental health evaluation and was subsequently brought to the emergency department for evaluation.  Patient initial presentation was agitated, swearing and noncompliant.  Patient received Haldol and has since calmed down and was cooperative with the current assessment.  Patient thoughts are disorganized with focus of the conversation surrounding the temperature of her house and complaining of feeling too cold despite the warm weather.  Patient is oriented and alert.  Denies current Suicidal ideation/homicidal ideation.  Patient states she is not sure if she has been taking her medications.  HPI Elements:   Location:  generalized. Quality:  acute. Severity:  severe. Timing:   constant. Duration:  acute exacerbation of chronic condition over past two days. Context:  medication non-adhearance.  Past Psychiatric History: Past Medical History   Diagnosis Date  . Hypertension   . Pre-diabetes   . Bipolar affective disorder   . Schizophrenic disorder     reports that she has never smoked. She does not have any smokeless tobacco history on file. She reports that she does not drink alcohol or use illicit drugs. Family History  Problem Relation Age of Onset  . Hypertension Mother   . Colon cancer Mother   . Diabetes Mother   . Hypertension Father            Allergies:  No Known Allergies  ACT Assessment Complete:  Yes:    Educational Status    Risk to Self:    Risk to Others:    Abuse:    Prior Inpatient Therapy:    Prior Outpatient Therapy:    Additional Information:                    Objective: Blood pressure 145/77, pulse 85, temperature 98.7 F (37.1 C), temperature source Oral, resp. rate 17, SpO2 94.00%.There is no weight on file to calculate BMI. Results for orders placed during the hospital encounter of 05/04/14 (from the past 72 hour(s))  ACETAMINOPHEN LEVEL     Status: None   Collection Time    05/04/14  2:13 PM      Result Value Ref Range   Acetaminophen (Tylenol), Serum <15.0  10 - 30 ug/mL   Comment:            THERAPEUTIC CONCENTRATIONS VARY     SIGNIFICANTLY. A RANGE OF 10-30     ug/mL MAY BE AN EFFECTIVE     CONCENTRATION FOR MANY PATIENTS.  HOWEVER, SOME ARE BEST TREATED     AT CONCENTRATIONS OUTSIDE THIS     RANGE.     ACETAMINOPHEN CONCENTRATIONS     >150 ug/mL AT 4 HOURS AFTER     INGESTION AND >50 ug/mL AT 12     HOURS AFTER INGESTION ARE     OFTEN ASSOCIATED WITH TOXIC     REACTIONS.  CBC     Status: Abnormal   Collection Time    05/04/14  2:13 PM      Result Value Ref Range   WBC 7.3  4.0 - 10.5 K/uL   RBC 3.99  3.87 - 5.11 MIL/uL   Hemoglobin 10.9 (*) 12.0 - 15.0 g/dL   HCT 32.2 (*) 36.0 - 46.0 %   MCV 80.7  78.0 - 100.0 fL   MCH 27.3  26.0 - 34.0 pg   MCHC 33.9  30.0 - 36.0 g/dL   RDW 14.5  11.5 - 15.5 %   Platelets 207  150 - 400 K/uL  COMPREHENSIVE  METABOLIC PANEL     Status: Abnormal   Collection Time    05/04/14  2:13 PM      Result Value Ref Range   Sodium 139  137 - 147 mEq/L   Potassium 3.5 (*) 3.7 - 5.3 mEq/L   Chloride 99  96 - 112 mEq/L   CO2 28  19 - 32 mEq/L   Glucose, Bld 104 (*) 70 - 99 mg/dL   BUN 14  6 - 23 mg/dL   Creatinine, Ser 1.13 (*) 0.50 - 1.10 mg/dL   Calcium 9.7  8.4 - 10.5 mg/dL   Total Protein 7.9  6.0 - 8.3 g/dL   Albumin 3.7  3.5 - 5.2 g/dL   AST 17  0 - 37 U/L   ALT 8  0 - 35 U/L   Alkaline Phosphatase 81  39 - 117 U/L   Total Bilirubin 0.4  0.3 - 1.2 mg/dL   GFR calc non Af Amer 50 (*) >90 mL/min   GFR calc Af Amer 58 (*) >90 mL/min   Comment: (NOTE)     The eGFR has been calculated using the CKD EPI equation.     This calculation has not been validated in all clinical situations.     eGFR's persistently <90 mL/min signify possible Chronic Kidney     Disease.   Anion gap 12  5 - 15  ETHANOL     Status: None   Collection Time    05/04/14  2:13 PM      Result Value Ref Range   Alcohol, Ethyl (B) <11  0 - 11 mg/dL   Comment:            LOWEST DETECTABLE LIMIT FOR     SERUM ALCOHOL IS 11 mg/dL     FOR MEDICAL PURPOSES ONLY  SALICYLATE LEVEL     Status: Abnormal   Collection Time    05/04/14  2:13 PM      Result Value Ref Range   Salicylate Lvl <1.6 (*) 2.8 - 20.0 mg/dL   Labs are reviewed and are pertinent for low hemoglobin, and HCT   Current Facility-Administered Medications  Medication Dose Route Frequency Provider Last Rate Last Dose  . acetaminophen (TYLENOL) tablet 650 mg  650 mg Oral Q4H PRN Margarita Mail, PA-C      . alum & mag hydroxide-simeth (MAALOX/MYLANTA) 200-200-20 MG/5ML suspension 30 mL  30 mL Oral PRN Margarita Mail, PA-C      .  hydrochlorothiazide (HYDRODIURIL) tablet 25 mg  25 mg Oral Daily Margarita Mail, PA-C      . irbesartan (AVAPRO) tablet 150 mg  150 mg Oral Daily Margarita Mail, PA-C      . LORazepam (ATIVAN) tablet 1 mg  1 mg Oral Q8H PRN Margarita Mail, PA-C       . LORazepam (ATIVAN) tablet 1 mg  1 mg Oral TID Margarita Mail, PA-C      . multivitamin with minerals tablet 1 tablet  1 tablet Oral Daily Abigail Harris, PA-C      . ondansetron (ZOFRAN) tablet 4 mg  4 mg Oral Q8H PRN Margarita Mail, PA-C      . paliperidone (INVEGA) 24 hr tablet 6 mg  6 mg Oral Daily Abigail Harris, PA-C      . potassium chloride SA (K-DUR,KLOR-CON) CR tablet 10 mEq  10 mEq Oral Daily Margarita Mail, PA-C      . risperiDONE (RISPERDAL) tablet 1.5 mg  1.5 mg Oral QHS Abigail Harris, PA-C      . sertraline (ZOLOFT) tablet 50 mg  50 mg Oral Daily Abigail Harris, PA-C      . traZODone (DESYREL) tablet 100 mg  100 mg Oral QHS Margarita Mail, PA-C      . Valproic Acid (DEPAKENE) 250 MG/5ML syrup SYRP 1,000 mg  1,000 mg Oral QHS Margarita Mail, PA-C       Current Outpatient Prescriptions  Medication Sig Dispense Refill  . hydrochlorothiazide (HYDRODIURIL) 25 MG tablet Take 1 tablet (25 mg total) by mouth daily.      . irbesartan (AVAPRO) 150 MG tablet Take 1 tablet (150 mg total) by mouth daily.      Marland Kitchen LORazepam (ATIVAN) 1 MG tablet Take 1 tablet (1 mg total) by mouth 3 (three) times daily.  30 tablet  0  . Multiple Vitamin (MULTIVITAMIN WITH MINERALS) TABS tablet Take 1 tablet by mouth daily.      . paliperidone (INVEGA) 6 MG 24 hr tablet Take 1 tablet (6 mg total) by mouth daily.  30 tablet  0  . potassium chloride (K-DUR,KLOR-CON) 10 MEQ tablet Take 1 tablet (10 mEq total) by mouth daily.      . risperiDONE (RISPERDAL) 3 MG tablet Take 0.5 tablets (1.5 mg total) by mouth at bedtime.  30 tablet  0  . sertraline (ZOLOFT) 50 MG tablet Take 50 mg by mouth daily.      . traZODone (DESYREL) 100 MG tablet Take 1 tablet (100 mg total) by mouth at bedtime.  30 tablet  0  . Valproic Acid (DEPAKENE) 250 MG/5ML SYRP syrup Take 20 mLs (1,000 mg total) by mouth at bedtime. For mood stability.  600 mL  0    Psychiatric Specialty Exam:     Blood pressure 145/77, pulse 85, temperature  98.7 F (37.1 C), temperature source Oral, resp. rate 17, SpO2 94.00%.There is no weight on file to calculate BMI.  General Appearance: Casual  Eye Contact::  Fair  Speech:  Garbled  Volume:  Normal  Mood:  Irritable  Affect:  Blunt  Thought Process:  Disorganized and Loose  Orientation:  Full (Time, Place, and Person)  Thought Content:  Hallucinations: Auditory  Suicidal Thoughts:  No  Homicidal Thoughts:  No  Memory:  Immediate;   Poor Recent;   Poor Remote;   Poor  Judgement:  Impaired  Insight:  Lacking  Psychomotor Activity:  EPS  Concentration:  Poor  Recall:  Poor  Fund of Knowledge:Poor  Language: Poor  Akathisia:  Yes  Handed:  Right  AIMS (if indicated):     Assets:  Housing Social Support  Sleep:      Musculoskeletal: Strength & Muscle Tone: within normal limits Gait & Station: normal Patient leans: N/A  Treatment Plan Summary: Daily contact with patient to assess and evaluate symptoms and progress in treatment Medication management Recommend inpatient Psychiatric hospitalization for stabilization of mood and thought processes   Waylan Boga PMH-NP 05/04/2014 3:23 PM

## 2014-05-04 NOTE — ED Notes (Signed)
Pt walked out of room and was directed back to room by [employer], RN

## 2014-05-04 NOTE — ED Notes (Signed)
Pt walked out of room and requested a warm blanket and towel which was provided

## 2014-05-04 NOTE — ED Notes (Signed)
Pt ambulated back to room w/o assistance. Sitter at bedside

## 2014-05-04 NOTE — Progress Notes (Signed)
  CARE MANAGEMENT ED NOTE 05/04/2014  Patient:  Kaylee Kelley,Kaylee Kelley   Account Number:  [id number]  Date Initiated:  05/04/2014  Documentation initiated by:  Radford PaxFERRERO,Jakayden Cancio  Subjective/Objective Assessment:   Patient  [facility] patient due to an exacerbation of psychosis and mania.  Patient brought to ED     Subjective/Objective Assessment Detail:   Patient with pmhx of HTN, pre diabetes, Biploar, Schizophrenic disorder     Action/Plan:   Action/Plan Detail:   Anticipated DC Date:       Status Recommendation to Physician:   Result of Recommendation:    Other ED Services  Consult Working Plan    DC Planning Services  Other  PCP issues    Choice offered to / List presented to:            Status of service:  Completed, signed off  ED Comments:   ED Comments Detail:  EDCM spoke to patient at bedside.  Patient reports her pcp is located at the [facility] in [place].  System updated.

## 2014-05-04 NOTE — ED Notes (Signed)
Asked pt to provide urine specimen pt stated she "don't want to use the bathroom"

## 2014-05-04 NOTE — ED Notes (Signed)
Attempted to pull K+ from pyxis, but would not allow. Pharmacy states it is because of the dose ordered and will send correct dose from pharmacy.

## 2014-05-04 NOTE — ED Notes (Signed)
Pt niece Myrene BuddyYvonne: [phone], work [phone]

## 2014-05-04 NOTE — ED Notes (Signed)
Bed: [facility] Expected date:  Expected time:  Means of arrival:  Comments: Hold for patient

## 2014-05-04 NOTE — ED Notes (Addendum)
Pt quietly watching TV at this with family at bedside. Pt family member requested coffee. Decaf coffee provided with a sandwich. [employer] left bedside after requesting to leave since pt has calmed down

## 2014-05-04 NOTE — ED Provider Notes (Signed)
CSN: [id number]     Arrival date & time 05/04/14  1248 History   First MD Initiated Contact with Patient 05/04/14 1323     Chief Complaint  Patient presents with  . Manic Behavior     (Consider location/radiation/quality/duration/timing/severity/associated sxs/prior Treatment) HPI  Kaylee Kelley Is a 68 year old female with a past medical history of bipolar disorder, paranoid schizophrenia, hypertension who presents to the emergency department brought in by police and under involuntary commitment for acute manic psychosis. She is attended by her niece who is her primary caretaker. Patient's niece states that she has not been compliant with her medications at home for the past 2 days. She states that her current state" 10 out of 10" in terms of her agitation and psychosis. She has not had any complaints at home that the niece is aware.   Past Medical History  Diagnosis Date  . Hypertension   . Pre-diabetes   . Bipolar affective disorder   . Schizophrenic disorder    History reviewed. No pertinent past surgical history. Family History  Problem Relation Age of Onset  . Hypertension Mother   . Colon cancer Mother   . Diabetes Mother   . Hypertension Father    History  Substance Use Topics  . Smoking status: Never Smoker   . Smokeless tobacco: Not on file  . Alcohol Use: No     Comment: Pt denies    OB History   Grav Para Term Preterm Abortions TAB SAB Ect Mult Living                 Review of Systems  Ten systems reviewed and are negative for acute change, except as noted in the HPI.    Allergies  Review of patient's allergies indicates no known allergies.  Home Medications   Prior to Admission medications   Medication Sig Start Date End Date Taking? Authorizing Provider  escitalopram (LEXAPRO) 10 MG tablet Take 1 tablet (10 mg total) by mouth daily. 02/17/14   Nanine Means, NP  gabapentin (NEURONTIN) 100 MG capsule Take 1 capsule (100 mg total) by mouth 3 (three)  times daily. 02/17/14   Nanine Means, NP  hydrochlorothiazide (HYDRODIURIL) 25 MG tablet Take 1 tablet (25 mg total) by mouth daily. 02/17/14   Nanine Means, NP  irbesartan (AVAPRO) 150 MG tablet Take 1 tablet (150 mg total) by mouth daily. 02/17/14   Nanine Means, NP  LORazepam (ATIVAN) 1 MG tablet Take 1 tablet (1 mg total) by mouth 3 (three) times daily. 02/17/14   Nanine Means, NP  mineral oil-hydrophilic petrolatum (AQUAPHOR) ointment Apply topically as needed (rash). 02/17/14   Shon Baton, MD  paliperidone (INVEGA) 6 MG 24 hr tablet Take 1 tablet (6 mg total) by mouth daily. 02/17/14   Nanine Means, NP  potassium chloride (K-DUR,KLOR-CON) 10 MEQ tablet Take 1 tablet (10 mEq total) by mouth daily. 02/17/14   Nanine Means, NP  risperiDONE (RISPERDAL) 3 MG tablet Take 0.5 tablets (1.5 mg total) by mouth at bedtime. 02/17/14   Nanine Means, NP  traZODone (DESYREL) 100 MG tablet Take 1 tablet (100 mg total) by mouth at bedtime. 02/17/14   Nanine Means, NP  Valproic Acid (DEPAKENE) 250 MG/5ML SYRP syrup Take 20 mLs (1,000 mg total) by mouth at bedtime. For mood stability. 03/21/13   Verne Spurr, PA-C   BP 145/77  Pulse 85  Temp(Src) 98.7 F (37.1 C) (Oral)  Resp 17  SpO2 94% Physical Exam  Constitutional: She is oriented to  person, place, and time. She appears well-developed and well-nourished. No distress.  HENT:  Head: Normocephalic and atraumatic.  edentulous  Eyes: Conjunctivae are normal. No scleral icterus.  Neck: Normal range of motion.  Cardiovascular: Normal rate, regular rhythm and normal heart sounds.  Exam reveals no gallop and no friction rub.   No murmur heard. Pulmonary/Chest: Effort normal and breath sounds normal. No respiratory distress.  Abdominal: Soft. Bowel sounds are normal. She exhibits no distension and no mass. There is no tenderness. There is no guarding.  Neurological: She is alert and oriented to person, place, and time.  Skin: Skin is warm and dry. She is not  diaphoretic.  Psychiatric: Her affect is angry, labile and inappropriate. Her speech is rapid and/or pressured. She is agitated and combative. Thought content is paranoid. She expresses impulsivity and inappropriate judgment.  Patient is screaming loudly at the top of he lungs. Speech is rapid and pressured. Her speech is garbled and unintelligible. She seems very afraid of surrounding staff and police officers.    ED Course  Procedures (including critical care time) Labs Review Labs Reviewed - No data to display  Imaging Review No results found.   EKG Interpretation None      MDM   Final diagnoses:  Acute hysterical psychosis  Schizophrenia, paranoid type    1:20 PM Patient screaming loudly at of her lungs in the emergency department. She surrounded by police officers. Multiple staff members. Patient and niece is here with her and translate some of her apparently unintelligible speech. Niece states that she is from [place] and she also does not have her teeth and so it's difficult to understand her. She is also clearly manic and psychotic. She screaming about taking her baby from prior and appears very afraid. She is now at touch her.  2:17 PM Patient given 5 of haldol. She is calm and cooperative. We will consult with psych services.    Arthor CaptainAbigail Makel Mcmann, PA-C 05/08/14 [phone]

## 2014-05-04 NOTE — ED Notes (Signed)
Pt up to restroom hat placed in toilet in attempt to collect urine specimen

## 2014-05-05 DIAGNOSIS — F259 Schizoaffective disorder, unspecified: Secondary | ICD-10-CM

## 2014-05-05 MED ORDER — CEPHALEXIN 500 MG PO CAPS
500.0000 mg | ORAL_CAPSULE | Freq: Four times a day (QID) | ORAL | Status: DC
Start: 2014-05-05 — End: 2014-05-07
  Administered 2014-05-05 – 2014-05-07 (×9): 500 mg via ORAL
  Filled 2014-05-05 (×9): qty 1

## 2014-05-05 NOTE — ED Notes (Signed)
In the bathroom-will try to re collect: Pt missed the collection hat earlier

## 2014-05-05 NOTE — ED Provider Notes (Signed)
UA concerning for UTI. Will send culture. Abx ordered.   Raeford RazorStephen Shown Dissinger, MD 05/05/14 [phone]

## 2014-05-05 NOTE — ED Notes (Signed)
Up to the bathroom 

## 2014-05-05 NOTE — ED Notes (Signed)
Dr ross into see 

## 2014-05-05 NOTE — ED Notes (Signed)
Sitting quietly on the bed, pleasant, cooperative

## 2014-05-05 NOTE — Consult Note (Signed)
[place] Psychiatry Consult   Reason for Consult:  Psychosis, aggressive behavior Referring Physician:  EDP  Kaylee Kelley is an 68 y.o. female. Total Time spent with patient: 20 minutes  Assessment: AXIS I:  Schizoaffective Disorder AXIS II:  Deferred AXIS III:   Past Medical History  Diagnosis Date  . Hypertension   . Pre-diabetes   . Bipolar affective disorder   . Schizophrenic disorder    AXIS IV:  other psychosocial or environmental problems and problems related to social environment AXIS V:  21-30 behavior considerably influenced by delusions or hallucinations OR serious impairment in judgment, communication OR inability to function in almost all areas  Plan:  Recommend psychiatric Inpatient admission when medically cleared.  Subjective:   Kaylee Kelley is a 68 y.o. female patient admitted with acute psychosis and aggressive behavior.  HPI:  Kaylee Kelley is a 68 year old african american female who presented to the [facility] as a walk in patient due to an exacerbation of psychosis and mania.  Patient became increasingly agitated during the mental health evaluation and was subsequently brought to the emergency department for evaluation.  Patient initial presentation was agitated, swearing and noncompliant.  Patient received Haldol and has since calmed down and was cooperative with the current assessment.  Patient thoughts are disorganized with focus of the conversation surrounding the temperature of her house and complaining of feeling too cold despite the warm weather.  Patient is oriented and alert.  Denies current Suicidal ideation/homicidal ideation.  Patient states she is not sure if she has been taking her medications.   Patient was seen today 05/05/14. Still has garbled speech which has been complicated by poor dentition. MSW has spoken to niece who reports patient may have gotten off meds while niece was out of town. Patient has gotten increasingly paranoid about food. She is  eating well today but still seems somewhat confused.Home meds have been restarted.    HPI Elements:   Location:  generalized. Quality:  acute. Severity:  severe. Timing:   constant. Duration:  acute exacerbation of chronic condition over past two days. Context:  medication non-adhearance.  Past Psychiatric History: Past Medical History  Diagnosis Date  . Hypertension   . Pre-diabetes   . Bipolar affective disorder   . Schizophrenic disorder     reports that she has never smoked. She does not have any smokeless tobacco history on file. She reports that she does not drink alcohol or use illicit drugs. Family History  Problem Relation Age of Onset  . Hypertension Mother   . Colon cancer Mother   . Diabetes Mother   . Hypertension Father            Allergies:  No Known Allergies  ACT Assessment Complete:  Yes:    Educational Status    Risk to Self: Risk to self Is patient at risk for suicide?: No Substance abuse history and/or treatment for substance abuse?: No  Risk to Others:    Abuse:    Prior Inpatient Therapy:    Prior Outpatient Therapy:    Additional Information:                    Objective: Blood pressure 121/58, pulse 78, temperature 98.1 F (36.7 C), temperature source Oral, resp. rate 14, SpO2 100.00%.There is no weight on file to calculate BMI. Results for orders placed during the hospital encounter of 05/04/14 (from the past 72 hour(s))  ACETAMINOPHEN LEVEL     Status: None  Collection Time    05/04/14  2:13 PM      Result Value Ref Range   Acetaminophen (Tylenol), Serum <15.0  10 - 30 ug/mL   Comment:            THERAPEUTIC CONCENTRATIONS VARY     SIGNIFICANTLY. A RANGE OF 10-30     ug/mL MAY BE AN EFFECTIVE     CONCENTRATION FOR MANY PATIENTS.     HOWEVER, SOME ARE BEST TREATED     AT CONCENTRATIONS OUTSIDE THIS     RANGE.     ACETAMINOPHEN CONCENTRATIONS     >150 ug/mL AT 4 HOURS AFTER     INGESTION AND >50 ug/mL AT 12      HOURS AFTER INGESTION ARE     OFTEN ASSOCIATED WITH TOXIC     REACTIONS.  CBC     Status: Abnormal   Collection Time    05/04/14  2:13 PM      Result Value Ref Range   WBC 7.3  4.0 - 10.5 K/uL   RBC 3.99  3.87 - 5.11 MIL/uL   Hemoglobin 10.9 (*) 12.0 - 15.0 g/dL   HCT 32.2 (*) 36.0 - 46.0 %   MCV 80.7  78.0 - 100.0 fL   MCH 27.3  26.0 - 34.0 pg   MCHC 33.9  30.0 - 36.0 g/dL   RDW 14.5  11.5 - 15.5 %   Platelets 207  150 - 400 K/uL  COMPREHENSIVE METABOLIC PANEL     Status: Abnormal   Collection Time    05/04/14  2:13 PM      Result Value Ref Range   Sodium 139  137 - 147 mEq/L   Potassium 3.5 (*) 3.7 - 5.3 mEq/L   Chloride 99  96 - 112 mEq/L   CO2 28  19 - 32 mEq/L   Glucose, Bld 104 (*) 70 - 99 mg/dL   BUN 14  6 - 23 mg/dL   Creatinine, Ser 1.13 (*) 0.50 - 1.10 mg/dL   Calcium 9.7  8.4 - 10.5 mg/dL   Total Protein 7.9  6.0 - 8.3 g/dL   Albumin 3.7  3.5 - 5.2 g/dL   AST 17  0 - 37 U/L   ALT 8  0 - 35 U/L   Alkaline Phosphatase 81  39 - 117 U/L   Total Bilirubin 0.4  0.3 - 1.2 mg/dL   GFR calc non Af Amer 50 (*) >90 mL/min   GFR calc Af Amer 58 (*) >90 mL/min   Comment: (NOTE)     The eGFR has been calculated using the CKD EPI equation.     This calculation has not been validated in all clinical situations.     eGFR's persistently <90 mL/min signify possible Chronic Kidney     Disease.   Anion gap 12  5 - 15  ETHANOL     Status: None   Collection Time    05/04/14  2:13 PM      Result Value Ref Range   Alcohol, Ethyl (B) <11  0 - 11 mg/dL   Comment:            LOWEST DETECTABLE LIMIT FOR     SERUM ALCOHOL IS 11 mg/dL     FOR MEDICAL PURPOSES ONLY  SALICYLATE LEVEL     Status: Abnormal   Collection Time    05/04/14  2:13 PM      Result Value Ref Range   Salicylate Lvl <3.3 (*)  2.8 - 20.0 mg/dL  VALPROIC ACID LEVEL     Status: Abnormal   Collection Time    05/04/14  2:13 PM      Result Value Ref Range   Valproic Acid Lvl <10.0 (*) 50.0 - 100.0 ug/mL   Comment:  Performed at [facility] ([place])     Status: None   Collection Time    05/04/14  3:19 PM      Result Value Ref Range   Opiates NONE DETECTED  NONE DETECTED   Cocaine NONE DETECTED  NONE DETECTED   Benzodiazepines NONE DETECTED  NONE DETECTED   Amphetamines NONE DETECTED  NONE DETECTED   Tetrahydrocannabinol NONE DETECTED  NONE DETECTED   Barbiturates NONE DETECTED  NONE DETECTED   Comment:            DRUG SCREEN FOR MEDICAL PURPOSES     ONLY.  IF CONFIRMATION IS NEEDED     FOR ANY PURPOSE, NOTIFY LAB     WITHIN 5 DAYS.                LOWEST DETECTABLE LIMITS     FOR URINE DRUG SCREEN     Drug Class       Cutoff (ng/mL)     Amphetamine      1000     Barbiturate      200     Benzodiazepine   846     Tricyclics       962     Opiates          300     Cocaine          300     THC              50  URINALYSIS, ROUTINE W REFLEX MICROSCOPIC     Status: Abnormal   Collection Time    05/04/14  3:19 PM      Result Value Ref Range   Color, Urine AMBER (*) YELLOW   Comment: BIOCHEMICALS MAY BE AFFECTED BY COLOR   APPearance CLOUDY (*) CLEAR   Specific Gravity, Urine 1.015  1.005 - 1.030   pH 5.0  5.0 - 8.0   Glucose, UA NEGATIVE  NEGATIVE mg/dL   Hgb urine dipstick TRACE (*) NEGATIVE   Bilirubin Urine NEGATIVE  NEGATIVE   Ketones, ur NEGATIVE  NEGATIVE mg/dL   Protein, ur 30 (*) NEGATIVE mg/dL   Urobilinogen, UA 1.0  0.0 - 1.0 mg/dL   Nitrite NEGATIVE  NEGATIVE   Leukocytes, UA LARGE (*) NEGATIVE  URINE MICROSCOPIC-ADD ON     Status: Abnormal   Collection Time    05/04/14  3:19 PM      Result Value Ref Range   Squamous Epithelial / LPF FEW (*) RARE   WBC, UA 21-50  <3 WBC/hpf   RBC / HPF 0-2  <3 RBC/hpf   Bacteria, UA FEW (*) RARE   Casts HYALINE CASTS (*) NEGATIVE   Labs are reviewed and are pertinent for low hemoglobin, and HCT   Current Facility-Administered Medications  Medication Dose Route Frequency Provider Last Rate Last Dose   . acetaminophen (TYLENOL) tablet 650 mg  650 mg Oral Q4H PRN Margarita Mail, PA-C      . alum & mag hydroxide-simeth (MAALOX/MYLANTA) 200-200-20 MG/5ML suspension 30 mL  30 mL Oral PRN Margarita Mail, PA-C      . cephALEXin (KEFLEX) capsule 500 mg  500 mg Oral 4  times per day Virgel Manifold, MD   500 mg at 05/05/14 0604  . hydrochlorothiazide (HYDRODIURIL) tablet 25 mg  25 mg Oral Daily Margarita Mail, PA-C   25 mg at 05/05/14 1022  . irbesartan (AVAPRO) tablet 150 mg  150 mg Oral Daily Margarita Mail, PA-C   150 mg at 05/05/14 1022  . LORazepam (ATIVAN) tablet 1 mg  1 mg Oral Q8H PRN Margarita Mail, PA-C      . LORazepam (ATIVAN) tablet 1 mg  1 mg Oral TID Margarita Mail, PA-C   1 mg at 05/05/14 1022  . multivitamin with minerals tablet 1 tablet  1 tablet Oral Daily Margarita Mail, PA-C   1 tablet at 05/05/14 1022  . ondansetron (ZOFRAN) tablet 4 mg  4 mg Oral Q8H PRN Margarita Mail, PA-C      . paliperidone (INVEGA) 24 hr tablet 6 mg  6 mg Oral Daily Margarita Mail, PA-C   6 mg at 05/05/14 1022  . potassium chloride SA (K-DUR,KLOR-CON) CR tablet 10 mEq  10 mEq Oral Daily Margarita Mail, PA-C   10 mEq at 05/05/14 1100  . risperiDONE (RISPERDAL) tablet 1.5 mg  1.5 mg Oral QHS Waylan Boga, NP   1.5 mg at 05/04/14 2128  . sertraline (ZOLOFT) tablet 50 mg  50 mg Oral Daily Margarita Mail, PA-C   50 mg at 05/05/14 1022  . traZODone (DESYREL) tablet 100 mg  100 mg Oral QHS Margarita Mail, PA-C   100 mg at 05/04/14 2129  . Valproic Acid (DEPAKENE) 250 MG/5ML syrup SYRP 1,000 mg  1,000 mg Oral QHS Margarita Mail, PA-C   1,000 mg at 05/04/14 2129   Current Outpatient Prescriptions  Medication Sig Dispense Refill  . hydrochlorothiazide (HYDRODIURIL) 25 MG tablet Take 1 tablet (25 mg total) by mouth daily.      . irbesartan (AVAPRO) 150 MG tablet Take 1 tablet (150 mg total) by mouth daily.      Marland Kitchen LORazepam (ATIVAN) 1 MG tablet Take 1 tablet (1 mg total) by mouth 3 (three) times daily.  30 tablet  0  .  Multiple Vitamin (MULTIVITAMIN WITH MINERALS) TABS tablet Take 1 tablet by mouth daily.      . paliperidone (INVEGA) 6 MG 24 hr tablet Take 1 tablet (6 mg total) by mouth daily.  30 tablet  0  . potassium chloride (K-DUR,KLOR-CON) 10 MEQ tablet Take 1 tablet (10 mEq total) by mouth daily.      . risperiDONE (RISPERDAL) 3 MG tablet Take 0.5 tablets (1.5 mg total) by mouth at bedtime.  30 tablet  0  . sertraline (ZOLOFT) 50 MG tablet Take 50 mg by mouth daily.      . traZODone (DESYREL) 100 MG tablet Take 1 tablet (100 mg total) by mouth at bedtime.  30 tablet  0  . Valproic Acid (DEPAKENE) 250 MG/5ML SYRP syrup Take 20 mLs (1,000 mg total) by mouth at bedtime. For mood stability.  600 mL  0    Psychiatric Specialty Exam:     Blood pressure 121/58, pulse 78, temperature 98.1 F (36.7 C), temperature source Oral, resp. rate 14, SpO2 100.00%.There is no weight on file to calculate BMI.  General Appearance: Casual  Eye Contact::  Fair  Speech:  Garbled  Volume:  Normal  Mood:  pleasant  Affect:  Blunt  Thought Process:  Disorganized and Loose  Orientation:  Full (Time, Place, and Person)  Thought Content:  Hallucinations: Auditory  Suicidal Thoughts:  No  Homicidal Thoughts:  No  Memory:  Immediate;   Poor Recent;   Poor Remote;   Poor  Judgement:  Impaired  Insight:  Lacking  Psychomotor Activity:  EPS  Concentration:  Poor  Recall:  Poor  Fund of Knowledge:Poor  Language: Poor  Akathisia:  Yes  Handed:  Right  AIMS (if indicated):     Assets:  Housing Social Support  Sleep:      Musculoskeletal: Strength & Muscle Tone: within normal limits Gait & Station: normal Patient leans: N/A  Treatment Plan Summary: Daily contact with patient to assess and evaluate symptoms and progress in treatment Medication management Recommend inpatient Psychiatric hospitalization for stabilization of mood and thought processes . Will continue to monitor symptoms. Patient may improve enough to  go home within the next day   Levonne Spiller MD 05/05/2014 11:47 AM

## 2014-05-05 NOTE — ED Notes (Signed)
Urine sample for culture was from yesterday-will recollect

## 2014-05-05 NOTE — ED Notes (Signed)
Sitting in room eating breakfast talking to self

## 2014-05-05 NOTE — Progress Notes (Signed)
CSW was requested by Dr. Tenny Crawoss to call the patient's niece to collect collateral information.  Niece reports the patient is [facility] and medication management. [facility] weeks [facility].  Patient had [facility] about a year ago but it stopped because the patient refused to the sign the paperwork to continue the services.  The niece is requesting the referral is made to restart [facility] services with [facility] for the patient.  She denies any recent changes other than the niece being out of town for the last couple days and she was being care for by the CNA daily.  She reports that the CNS is with [facility] [place] [phone] for 2 hours a day.  The niece reports that the patient became inconsistent with her medications in the past couple days then became more paranoid.  She reports that the patient become paranoid, suspicious, stop eating, stop bathing, and verbally aggressive when off her medications.  The patient moved in with the niece 2 years ago and it was a big adjustment from living alone to having other adults and children in the home.  She reports that the patient becomes agitated with the teenaged children coming/going in the home with other visitors and this could increase some symptoms of paranoia plus agitation.  The patient is originally from [place] the [place] for many years.  The patient's last inpatient hospitalizations [facility] and 05/2012.  The patient had been diagnosed 40 years ago and since struggled with maintaining symptoms.  The niece is willing to become the patient's POA and would like some assistance with completing that before the patient is discharged.    Maryelizabeth Rowanressa Jessi Jessop, MSW, WardLCSWA, 05/05/2014 Evening Clinical Social Worker [phone]

## 2014-05-06 ENCOUNTER — Encounter (HOSPITAL_COMMUNITY): Payer: Self-pay | Admitting: Psychiatry

## 2014-05-06 DIAGNOSIS — F259 Schizoaffective disorder, unspecified: Secondary | ICD-10-CM | POA: Diagnosis not present

## 2014-05-06 LAB — URINE CULTURE
Colony Count: NO GROWTH
Culture: NO GROWTH

## 2014-05-06 NOTE — ED Notes (Signed)
Niece Suzi Rootsyvonne called and is not going to be available to come and see the patient today (she is on her way to work and does not get off until midnight).  Will notify MD/NP so that they can contact her in the AM.

## 2014-05-06 NOTE — ED Notes (Signed)
Granddaughter reports pt lives w/ her aunt and she is not sure if she is coming today

## 2014-05-06 NOTE — ED Notes (Signed)
Dr ross and jamison into see 

## 2014-05-06 NOTE — ED Notes (Signed)
Granddaughter into see

## 2014-05-06 NOTE — ED Notes (Signed)
Message left for patients niece-Kaylee Kelley [phone])- to call

## 2014-05-06 NOTE — Consult Note (Signed)
[facility] Consult   Reason for Consult:  Psychosis, aggressive behavior Referring Physician:  EDP  Kaylee Kelley is an 68 y.o. female. Total Time spent with patient: 20 minutes  Assessment: AXIS I:  Schizoaffective Disorder AXIS II:  Deferred AXIS III:   Past Medical History  Diagnosis Date  . Hypertension   . Pre-diabetes   . Bipolar affective disorder   . Schizophrenic disorder    AXIS IV:  other psychosocial or environmental problems and problems related to social environment AXIS V:  60; moderate symptoms  Plan:  Discharge home with her niece; Dr. Harrington Challenger assessed the patient and concurs with the plan.  Subjective:   Kaylee Kelley is a 68 y.o. female patient admitted with acute psychosis and aggressive behavior.  HPI:  Kaylee Kelley appears to be at her baseline.  Denies suicidal/homicidal ideations and hallucinations.  Her niece who manages her care is coming to visit and if she feels she is at her baseline, patient will be discharged home with follow-up with her regular provider.  Past Psychiatric History: Past Medical History  Diagnosis Date  . Hypertension   . Pre-diabetes   . Bipolar affective disorder   . Schizophrenic disorder     reports that she has never smoked. She does not have any smokeless tobacco history on file. She reports that she does not drink alcohol or use illicit drugs. Family History  Problem Relation Age of Onset  . Hypertension Mother   . Colon cancer Mother   . Diabetes Mother   . Hypertension Father            Allergies:  No Known Allergies  ACT Assessment Complete:  Yes:    Educational Status    Risk to Self: Risk to self Is patient at risk for suicide?: No Substance abuse history and/or treatment for substance abuse?: No  Risk to Others:    Abuse:    Prior Inpatient Therapy:    Prior Outpatient Therapy:    Additional Information:                    Objective: Blood pressure 150/76, pulse 98,  temperature 97.6 F (36.4 C), temperature source Axillary, resp. rate 16, SpO2 98.00%.There is no weight on file to calculate BMI. Results for orders placed during the hospital encounter of 05/04/14 (from the past 72 hour(s))  ACETAMINOPHEN LEVEL     Status: None   Collection Time    05/04/14  2:13 PM      Result Value Ref Range   Acetaminophen (Tylenol), Serum <15.0  10 - 30 ug/mL   Comment:            THERAPEUTIC CONCENTRATIONS VARY     SIGNIFICANTLY. A RANGE OF 10-30     ug/mL MAY BE AN EFFECTIVE     CONCENTRATION FOR MANY PATIENTS.     HOWEVER, SOME ARE BEST TREATED     AT CONCENTRATIONS OUTSIDE THIS     RANGE.     ACETAMINOPHEN CONCENTRATIONS     >150 ug/mL AT 4 HOURS AFTER     INGESTION AND >50 ug/mL AT 12     HOURS AFTER INGESTION ARE     OFTEN ASSOCIATED WITH TOXIC     REACTIONS.  CBC     Status: Abnormal   Collection Time    05/04/14  2:13 PM      Result Value Ref Range   WBC 7.3  4.0 - 10.5 K/uL   RBC 3.99  3.87 - 5.11 MIL/uL   Hemoglobin 10.9 (*) 12.0 - 15.0 g/dL   HCT 32.2 (*) 36.0 - 46.0 %   MCV 80.7  78.0 - 100.0 fL   MCH 27.3  26.0 - 34.0 pg   MCHC 33.9  30.0 - 36.0 g/dL   RDW 14.5  11.5 - 15.5 %   Platelets 207  150 - 400 K/uL  COMPREHENSIVE METABOLIC PANEL     Status: Abnormal   Collection Time    05/04/14  2:13 PM      Result Value Ref Range   Sodium 139  137 - 147 mEq/L   Potassium 3.5 (*) 3.7 - 5.3 mEq/L   Chloride 99  96 - 112 mEq/L   CO2 28  19 - 32 mEq/L   Glucose, Bld 104 (*) 70 - 99 mg/dL   BUN 14  6 - 23 mg/dL   Creatinine, Ser 1.13 (*) 0.50 - 1.10 mg/dL   Calcium 9.7  8.4 - 10.5 mg/dL   Total Protein 7.9  6.0 - 8.3 g/dL   Albumin 3.7  3.5 - 5.2 g/dL   AST 17  0 - 37 U/L   ALT 8  0 - 35 U/L   Alkaline Phosphatase 81  39 - 117 U/L   Total Bilirubin 0.4  0.3 - 1.2 mg/dL   GFR calc non Af Amer 50 (*) >90 mL/min   GFR calc Af Amer 58 (*) >90 mL/min   Comment: (NOTE)     The eGFR has been calculated using the CKD EPI equation.     This  calculation has not been validated in all clinical situations.     eGFR's persistently <90 mL/min signify possible Chronic Kidney     Disease.   Anion gap 12  5 - 15  ETHANOL     Status: None   Collection Time    05/04/14  2:13 PM      Result Value Ref Range   Alcohol, Ethyl (B) <11  0 - 11 mg/dL   Comment:            LOWEST DETECTABLE LIMIT FOR     SERUM ALCOHOL IS 11 mg/dL     FOR MEDICAL PURPOSES ONLY  SALICYLATE LEVEL     Status: Abnormal   Collection Time    05/04/14  2:13 PM      Result Value Ref Range   Salicylate Lvl <6.2 (*) 2.8 - 20.0 mg/dL  VALPROIC ACID LEVEL     Status: Abnormal   Collection Time    05/04/14  2:13 PM      Result Value Ref Range   Valproic Acid Lvl <10.0 (*) 50.0 - 100.0 ug/mL   Comment: Performed at [facility] ([place])     Status: None   Collection Time    05/04/14  3:19 PM      Result Value Ref Range   Opiates NONE DETECTED  NONE DETECTED   Cocaine NONE DETECTED  NONE DETECTED   Benzodiazepines NONE DETECTED  NONE DETECTED   Amphetamines NONE DETECTED  NONE DETECTED   Tetrahydrocannabinol NONE DETECTED  NONE DETECTED   Barbiturates NONE DETECTED  NONE DETECTED   Comment:            DRUG SCREEN FOR MEDICAL PURPOSES     ONLY.  IF CONFIRMATION IS NEEDED     FOR ANY PURPOSE, NOTIFY LAB     WITHIN 5 DAYS.  LOWEST DETECTABLE LIMITS     FOR URINE DRUG SCREEN     Drug Class       Cutoff (ng/mL)     Amphetamine      1000     Barbiturate      200     Benzodiazepine   628     Tricyclics       366     Opiates          300     Cocaine          300     THC              50  URINALYSIS, ROUTINE W REFLEX MICROSCOPIC     Status: Abnormal   Collection Time    05/04/14  3:19 PM      Result Value Ref Range   Color, Urine AMBER (*) YELLOW   Comment: BIOCHEMICALS MAY BE AFFECTED BY COLOR   APPearance CLOUDY (*) CLEAR   Specific Gravity, Urine 1.015  1.005 - 1.030   pH 5.0  5.0 - 8.0   Glucose,  UA NEGATIVE  NEGATIVE mg/dL   Hgb urine dipstick TRACE (*) NEGATIVE   Bilirubin Urine NEGATIVE  NEGATIVE   Ketones, ur NEGATIVE  NEGATIVE mg/dL   Protein, ur 30 (*) NEGATIVE mg/dL   Urobilinogen, UA 1.0  0.0 - 1.0 mg/dL   Nitrite NEGATIVE  NEGATIVE   Leukocytes, UA LARGE (*) NEGATIVE  URINE MICROSCOPIC-ADD ON     Status: Abnormal   Collection Time    05/04/14  3:19 PM      Result Value Ref Range   Squamous Epithelial / LPF FEW (*) RARE   WBC, UA 21-50  <3 WBC/hpf   RBC / HPF 0-2  <3 RBC/hpf   Bacteria, UA FEW (*) RARE   Casts HYALINE CASTS (*) NEGATIVE   Labs are reviewed and are pertinent for low hemoglobin, and HCT   Current Facility-Administered Medications  Medication Dose Route Frequency Provider Last Rate Last Dose  . acetaminophen (TYLENOL) tablet 650 mg  650 mg Oral Q4H PRN Margarita Mail, PA-C      . alum & mag hydroxide-simeth (MAALOX/MYLANTA) 200-200-20 MG/5ML suspension 30 mL  30 mL Oral PRN Margarita Mail, PA-C      . cephALEXin (KEFLEX) capsule 500 mg  500 mg Oral 4 times per day Virgel Manifold, MD   500 mg at 05/06/14 0600  . hydrochlorothiazide (HYDRODIURIL) tablet 25 mg  25 mg Oral Daily Margarita Mail, PA-C   25 mg at 05/06/14 1031  . irbesartan (AVAPRO) tablet 150 mg  150 mg Oral Daily Margarita Mail, PA-C   150 mg at 05/06/14 1031  . LORazepam (ATIVAN) tablet 1 mg  1 mg Oral Q8H PRN Margarita Mail, PA-C      . LORazepam (ATIVAN) tablet 1 mg  1 mg Oral TID Margarita Mail, PA-C   1 mg at 05/06/14 1031  . multivitamin with minerals tablet 1 tablet  1 tablet Oral Daily Margarita Mail, PA-C   1 tablet at 05/06/14 1031  . ondansetron (ZOFRAN) tablet 4 mg  4 mg Oral Q8H PRN Margarita Mail, PA-C      . paliperidone (INVEGA) 24 hr tablet 6 mg  6 mg Oral Daily Margarita Mail, PA-C   6 mg at 05/06/14 1031  . potassium chloride SA (K-DUR,KLOR-CON) CR tablet 10 mEq  10 mEq Oral Daily Margarita Mail, PA-C   10 mEq at 05/06/14 1032  . risperiDONE (RISPERDAL)  tablet 1.5 mg  1.5 mg  Oral QHS Waylan Boga, NP   1.5 mg at 05/05/14 2108  . sertraline (ZOLOFT) tablet 50 mg  50 mg Oral Daily Margarita Mail, PA-C   50 mg at 05/06/14 1031  . traZODone (DESYREL) tablet 100 mg  100 mg Oral QHS Margarita Mail, PA-C   100 mg at 05/05/14 2108  . Valproic Acid (DEPAKENE) 250 MG/5ML syrup SYRP 1,000 mg  1,000 mg Oral QHS Margarita Mail, PA-C   1,000 mg at 05/05/14 2108   Current Outpatient Prescriptions  Medication Sig Dispense Refill  . hydrochlorothiazide (HYDRODIURIL) 25 MG tablet Take 1 tablet (25 mg total) by mouth daily.      . irbesartan (AVAPRO) 150 MG tablet Take 1 tablet (150 mg total) by mouth daily.      Marland Kitchen LORazepam (ATIVAN) 1 MG tablet Take 1 tablet (1 mg total) by mouth 3 (three) times daily.  30 tablet  0  . Multiple Vitamin (MULTIVITAMIN WITH MINERALS) TABS tablet Take 1 tablet by mouth daily.      . paliperidone (INVEGA) 6 MG 24 hr tablet Take 1 tablet (6 mg total) by mouth daily.  30 tablet  0  . potassium chloride (K-DUR,KLOR-CON) 10 MEQ tablet Take 1 tablet (10 mEq total) by mouth daily.      . risperiDONE (RISPERDAL) 3 MG tablet Take 0.5 tablets (1.5 mg total) by mouth at bedtime.  30 tablet  0  . sertraline (ZOLOFT) 50 MG tablet Take 50 mg by mouth daily.      . traZODone (DESYREL) 100 MG tablet Take 1 tablet (100 mg total) by mouth at bedtime.  30 tablet  0  . Valproic Acid (DEPAKENE) 250 MG/5ML SYRP syrup Take 20 mLs (1,000 mg total) by mouth at bedtime. For mood stability.  600 mL  0    Psychiatric Specialty Exam:     Blood pressure 150/76, pulse 98, temperature 97.6 F (36.4 C), temperature source Axillary, resp. rate 16, SpO2 98.00%.There is no weight on file to calculate BMI.  General Appearance: Casual  Eye Contact::  Fair  Speech:  Garbled  Volume:  Normal  Mood:  Euthymic  Affect:  Blunt  Thought Process:  Irrevelant  Orientation:  Full (Time, Place, and Person)  Thought Content:  Denies  Suicidal Thoughts:  No  Homicidal Thoughts:  No   Memory:  Fair  Judgement:  Fair  Insight:  Fair  Psychomotor Activity:  Normal  Concentration:  Fair  Recall:  [place] of Knowledge: Fair  Language: Fair  Akathisia:  Yes  Handed:  Right  AIMS (if indicated):     Assets:  Housing Social Support  Sleep:      Musculoskeletal: Strength & Muscle Tone: within normal limits Gait & Station: normal Patient leans: N/A  Treatment Plan Summary: Discharge home with her niece and follow-up with her regular provider.  Waylan Boga PMH-NP 05/06/2014 11:01 AM Patient seen and I agree with assessment and plan Levonne Spiller MD

## 2014-05-06 NOTE — ED Notes (Signed)
Up to the bathroom 

## 2014-05-06 NOTE — [facility] Suicide Risk Assessment (Signed)
Suicide Risk Assessment  Discharge Assessment     Demographic Factors:  Divorced or widowed  Total Time spent with patient: 20 minutes  Psychiatric Specialty Exam:      Blood pressure 150/76, pulse 98, temperature 97.6 F (36.4 C), temperature source Axillary, resp. rate 16, SpO2 98.00%.There is no weight on file to calculate BMI.   General Appearance: Casual   Eye Contact:: Fair   Speech: Garbled   Volume: Normal   Mood: Euthymic   Affect: Blunt   Thought Process: Irrevelant   Orientation: Full (Time, Place, and Person)   Thought Content: Denies   Suicidal Thoughts: No   Homicidal Thoughts: No   Memory: Fair   Judgement: Fair   Insight: Fair   Psychomotor Activity: Normal   Concentration: Fair   Recall: Fair   Fund of Knowledge: Fair   Language: Fair   Akathisia: Yes   Handed: Right   AIMS (if indicated):   Assets: Housing  Social Support   Sleep:   Musculoskeletal:  Strength & Muscle Tone: within normal limits  Gait & Station: normal  Patient leans: N/A  Mental Status Per Nursing Assessment::   On Admission:   Psychosis  Current Mental Status by Physician: NA  Loss Factors: NA  Historical Factors: NA  Risk Reduction Factors:   Sense of responsibility to family, Living with another person, especially a relative, Positive social support and Positive coping skills or problem solving skills  Continued Clinical Symptoms:  None  Cognitive Features That Contribute To Risk:  None  Suicide Risk:  Minimal: No identifiable suicidal ideation.  Patients presenting with no risk factors but with morbid ruminations; may be classified as minimal risk based on the severity of the depressive symptoms  Discharge Diagnoses:   AXIS I:  Schizoaffective Disorder AXIS II:  Deferred AXIS III:   Past Medical History  Diagnosis Date  . Hypertension   . Pre-diabetes   . Bipolar affective disorder   . Schizophrenic disorder    AXIS IV:  choronic mental  illness AXIS V:  61-70 mild symptoms  Plan Of Care/Follow-up recommendations:  Activity:  as tolerated Diet:  low-sodium heart healthy diet  Is patient on multiple antipsychotic therapies at discharge:  No   Has Patient had three or more failed trials of antipsychotic monotherapy by history:  No  Recommended Plan for Multiple Antipsychotic Therapies: NA    Amatullah Christy, PMH-NP 05/06/2014, 12:36 PM

## 2014-05-07 ENCOUNTER — Encounter (HOSPITAL_COMMUNITY): Payer: Self-pay | Admitting: Psychiatry

## 2014-05-07 DIAGNOSIS — F259 Schizoaffective disorder, unspecified: Secondary | ICD-10-CM | POA: Diagnosis not present

## 2014-05-07 MED ORDER — CEPHALEXIN 500 MG PO CAPS: 500.0000 mg | ORAL_CAPSULE | Freq: Two times a day (BID) | ORAL | Status: DC

## 2014-05-07 MED ORDER — SERTRALINE HCL 50 MG PO TABS: 50.0000 mg | ORAL_TABLET | Freq: Every day | ORAL | Status: AC

## 2014-05-07 MED ORDER — TRAZODONE HCL 100 MG PO TABS: 100.0000 mg | ORAL_TABLET | Freq: Every day | ORAL | Status: AC

## 2014-05-07 MED ORDER — CEPHALEXIN 500 MG PO CAPS: 500.0000 mg | ORAL_CAPSULE | Freq: Two times a day (BID) | ORAL | Status: AC

## 2014-05-07 MED ORDER — PALIPERIDONE ER 6 MG PO TB24: 6.0000 mg | ORAL_TABLET | Freq: Every day | ORAL | Status: AC

## 2014-05-07 MED ORDER — RISPERIDONE 3 MG PO TABS: 1.5000 mg | ORAL_TABLET | Freq: Every day | ORAL | Status: AC

## 2014-05-07 MED ORDER — VALPROIC ACID 250 MG/5ML PO SYRP: 1000.0000 mg | ORAL_SOLUTION | Freq: Every day | ORAL | Status: AC

## 2014-05-07 NOTE — Consult Note (Signed)
Agree with plan, patient needs inpatient psychiatric care

## 2014-05-07 NOTE — Consult Note (Signed)
Patient seen, evaluated by me this morning. Patient is back to baseline and can be discharged home in the care of her niece

## 2014-05-07 NOTE — Consult Note (Signed)
[facility] Consult   Reason for Consult:  Psychosis, aggressive behavior Referring Physician:  EDP  Kaylee Kelley is an 68 y.o. female. Total Time spent with patient: 20 minutes  Assessment: AXIS I:  Schizoaffective Disorder AXIS II:  Deferred AXIS III:   Past Medical History  Diagnosis Date  . Hypertension   . Pre-diabetes   . Bipolar affective disorder   . Schizophrenic disorder    AXIS IV:  other psychosocial or environmental problems and problems related to social environment AXIS V:  70; moderate symptoms  Plan:  Discharge home with her niece; Dr. Dwyane Dee assessed the patient and concurs with the plan.  Subjective:   Kaylee Kelley is a 68 y.o. female patient admitted with acute psychosis and aggressive behavior.  HPI:  Ms. Julian appears to be at her baseline.  Denies suicidal/homicidal ideations and hallucinations.  Her niece will come to pick her up after work to take her home.  Past Psychiatric History: Past Medical History  Diagnosis Date  . Hypertension   . Pre-diabetes   . Bipolar affective disorder   . Schizophrenic disorder     reports that she has never smoked. She does not have any smokeless tobacco history on file. She reports that she does not drink alcohol or use illicit drugs. Family History  Problem Relation Age of Onset  . Hypertension Mother   . Colon cancer Mother   . Diabetes Mother   . Hypertension Father            Allergies:  No Known Allergies  ACT Assessment Complete:  Yes:    Educational Status    Risk to Self: Risk to self Is patient at risk for suicide?: No Substance abuse history and/or treatment for substance abuse?: No  Risk to Others:    Abuse:    Prior Inpatient Therapy:    Prior Outpatient Therapy:    Additional Information:                    Objective: Blood pressure 143/65, pulse 75, temperature 98.7 F (37.1 C), temperature source Oral, resp. rate 16, SpO2 100.00%.There is no weight on  file to calculate BMI. Results for orders placed during the hospital encounter of 05/04/14 (from the past 72 hour(s))  ACETAMINOPHEN LEVEL     Status: None   Collection Time    05/04/14  2:13 PM      Result Value Ref Range   Acetaminophen (Tylenol), Serum <15.0  10 - 30 ug/mL   Comment:            THERAPEUTIC CONCENTRATIONS VARY     SIGNIFICANTLY. A RANGE OF 10-30     ug/mL MAY BE AN EFFECTIVE     CONCENTRATION FOR MANY PATIENTS.     HOWEVER, SOME ARE BEST TREATED     AT CONCENTRATIONS OUTSIDE THIS     RANGE.     ACETAMINOPHEN CONCENTRATIONS     >150 ug/mL AT 4 HOURS AFTER     INGESTION AND >50 ug/mL AT 12     HOURS AFTER INGESTION ARE     OFTEN ASSOCIATED WITH TOXIC     REACTIONS.  CBC     Status: Abnormal   Collection Time    05/04/14  2:13 PM      Result Value Ref Range   WBC 7.3  4.0 - 10.5 K/uL   RBC 3.99  3.87 - 5.11 MIL/uL   Hemoglobin 10.9 (*) 12.0 - 15.0 g/dL   HCT  32.2 (*) 36.0 - 46.0 %   MCV 80.7  78.0 - 100.0 fL   MCH 27.3  26.0 - 34.0 pg   MCHC 33.9  30.0 - 36.0 g/dL   RDW 14.5  11.5 - 15.5 %   Platelets 207  150 - 400 K/uL  COMPREHENSIVE METABOLIC PANEL     Status: Abnormal   Collection Time    05/04/14  2:13 PM      Result Value Ref Range   Sodium 139  137 - 147 mEq/L   Potassium 3.5 (*) 3.7 - 5.3 mEq/L   Chloride 99  96 - 112 mEq/L   CO2 28  19 - 32 mEq/L   Glucose, Bld 104 (*) 70 - 99 mg/dL   BUN 14  6 - 23 mg/dL   Creatinine, Ser 1.13 (*) 0.50 - 1.10 mg/dL   Calcium 9.7  8.4 - 10.5 mg/dL   Total Protein 7.9  6.0 - 8.3 g/dL   Albumin 3.7  3.5 - 5.2 g/dL   AST 17  0 - 37 U/L   ALT 8  0 - 35 U/L   Alkaline Phosphatase 81  39 - 117 U/L   Total Bilirubin 0.4  0.3 - 1.2 mg/dL   GFR calc non Af Amer 50 (*) >90 mL/min   GFR calc Af Amer 58 (*) >90 mL/min   Comment: (NOTE)     The eGFR has been calculated using the CKD EPI equation.     This calculation has not been validated in all clinical situations.     eGFR's persistently <90 mL/min signify  possible Chronic Kidney     Disease.   Anion gap 12  5 - 15  ETHANOL     Status: None   Collection Time    05/04/14  2:13 PM      Result Value Ref Range   Alcohol, Ethyl (B) <11  0 - 11 mg/dL   Comment:            LOWEST DETECTABLE LIMIT FOR     SERUM ALCOHOL IS 11 mg/dL     FOR MEDICAL PURPOSES ONLY  SALICYLATE LEVEL     Status: Abnormal   Collection Time    05/04/14  2:13 PM      Result Value Ref Range   Salicylate Lvl <1.6 (*) 2.8 - 20.0 mg/dL  VALPROIC ACID LEVEL     Status: Abnormal   Collection Time    05/04/14  2:13 PM      Result Value Ref Range   Valproic Acid Lvl <10.0 (*) 50.0 - 100.0 ug/mL   Comment: Performed at [facility] ([place])     Status: None   Collection Time    05/04/14  3:19 PM      Result Value Ref Range   Opiates NONE DETECTED  NONE DETECTED   Cocaine NONE DETECTED  NONE DETECTED   Benzodiazepines NONE DETECTED  NONE DETECTED   Amphetamines NONE DETECTED  NONE DETECTED   Tetrahydrocannabinol NONE DETECTED  NONE DETECTED   Barbiturates NONE DETECTED  NONE DETECTED   Comment:            DRUG SCREEN FOR MEDICAL PURPOSES     ONLY.  IF CONFIRMATION IS NEEDED     FOR ANY PURPOSE, NOTIFY LAB     WITHIN 5 DAYS.                LOWEST DETECTABLE LIMITS  FOR URINE DRUG SCREEN     Drug Class       Cutoff (ng/mL)     Amphetamine      1000     Barbiturate      200     Benzodiazepine   086     Tricyclics       761     Opiates          300     Cocaine          300     THC              50  URINALYSIS, ROUTINE W REFLEX MICROSCOPIC     Status: Abnormal   Collection Time    05/04/14  3:19 PM      Result Value Ref Range   Color, Urine AMBER (*) YELLOW   Comment: BIOCHEMICALS MAY BE AFFECTED BY COLOR   APPearance CLOUDY (*) CLEAR   Specific Gravity, Urine 1.015  1.005 - 1.030   pH 5.0  5.0 - 8.0   Glucose, UA NEGATIVE  NEGATIVE mg/dL   Hgb urine dipstick TRACE (*) NEGATIVE   Bilirubin Urine NEGATIVE  NEGATIVE    Ketones, ur NEGATIVE  NEGATIVE mg/dL   Protein, ur 30 (*) NEGATIVE mg/dL   Urobilinogen, UA 1.0  0.0 - 1.0 mg/dL   Nitrite NEGATIVE  NEGATIVE   Leukocytes, UA LARGE (*) NEGATIVE  URINE MICROSCOPIC-ADD ON     Status: Abnormal   Collection Time    05/04/14  3:19 PM      Result Value Ref Range   Squamous Epithelial / LPF FEW (*) RARE   WBC, UA 21-50  <3 WBC/hpf   RBC / HPF 0-2  <3 RBC/hpf   Bacteria, UA FEW (*) RARE   Casts HYALINE CASTS (*) NEGATIVE  URINE CULTURE     Status: None   Collection Time    05/05/14  5:26 PM      Result Value Ref Range   Specimen Description URINE, RANDOM     Special Requests keflex 54m     Culture  Setup Time       Value: 05/05/2014 22:07     Performed at [email or web address]       Value: NO GROWTH     Performed at [facility]  Culture       Value: NO GROWTH     Performed at [facility]  Report Status 05/06/2014 FINAL     Labs are reviewed and are pertinent for low hemoglobin, and HCT   Current Facility-Administered Medications  Medication Dose Route Frequency Provider Last Rate Last Dose  . acetaminophen (TYLENOL) tablet 650 mg  650 mg Oral Q4H PRN AMargarita Mail PA-C      . alum & mag hydroxide-simeth (MAALOX/MYLANTA) 200-200-20 MG/5ML suspension 30 mL  30 mL Oral PRN AMargarita Mail PA-C      . cephALEXin (KEFLEX) capsule 500 mg  500 mg Oral 4 times per day SVirgel Manifold MD   500 mg at 05/07/14 09509 . hydrochlorothiazide (HYDRODIURIL) tablet 25 mg  25 mg Oral Daily AMargarita Mail PA-C   25 mg at 05/06/14 1031  . irbesartan (AVAPRO) tablet 150 mg  150 mg Oral Daily AMargarita Mail PA-C   150 mg at 05/06/14 1031  . LORazepam (ATIVAN) tablet 1 mg  1 mg Oral Q8H PRN AMargarita Mail PA-C      . LORazepam (ATIVAN) tablet  1 mg  1 mg Oral TID Margarita Mail, PA-C   1 mg at 05/06/14 2141  . multivitamin with minerals tablet 1 tablet  1 tablet Oral Daily Margarita Mail, PA-C   1 tablet at 05/06/14 1031  .  ondansetron (ZOFRAN) tablet 4 mg  4 mg Oral Q8H PRN Margarita Mail, PA-C      . paliperidone (INVEGA) 24 hr tablet 6 mg  6 mg Oral Daily Margarita Mail, PA-C   6 mg at 05/06/14 1031  . potassium chloride SA (K-DUR,KLOR-CON) CR tablet 10 mEq  10 mEq Oral Daily Margarita Mail, PA-C   10 mEq at 05/06/14 1032  . risperiDONE (RISPERDAL) tablet 1.5 mg  1.5 mg Oral QHS Waylan Boga, NP   1.5 mg at 05/06/14 2141  . sertraline (ZOLOFT) tablet 50 mg  50 mg Oral Daily Margarita Mail, PA-C   50 mg at 05/06/14 1031  . traZODone (DESYREL) tablet 100 mg  100 mg Oral QHS Margarita Mail, PA-C   100 mg at 05/06/14 2141  . Valproic Acid (DEPAKENE) 250 MG/5ML syrup SYRP 1,000 mg  1,000 mg Oral QHS Margarita Mail, PA-C   1,000 mg at 05/06/14 2142   Current Outpatient Prescriptions  Medication Sig Dispense Refill  . hydrochlorothiazide (HYDRODIURIL) 25 MG tablet Take 1 tablet (25 mg total) by mouth daily.      . irbesartan (AVAPRO) 150 MG tablet Take 1 tablet (150 mg total) by mouth daily.      Marland Kitchen LORazepam (ATIVAN) 1 MG tablet Take 1 tablet (1 mg total) by mouth 3 (three) times daily.  30 tablet  0  . Multiple Vitamin (MULTIVITAMIN WITH MINERALS) TABS tablet Take 1 tablet by mouth daily.      . paliperidone (INVEGA) 6 MG 24 hr tablet Take 1 tablet (6 mg total) by mouth daily.  30 tablet  0  . potassium chloride (K-DUR,KLOR-CON) 10 MEQ tablet Take 1 tablet (10 mEq total) by mouth daily.      . risperiDONE (RISPERDAL) 3 MG tablet Take 0.5 tablets (1.5 mg total) by mouth at bedtime.  30 tablet  0  . sertraline (ZOLOFT) 50 MG tablet Take 50 mg by mouth daily.      . traZODone (DESYREL) 100 MG tablet Take 1 tablet (100 mg total) by mouth at bedtime.  30 tablet  0  . Valproic Acid (DEPAKENE) 250 MG/5ML SYRP syrup Take 20 mLs (1,000 mg total) by mouth at bedtime. For mood stability.  600 mL  0    Psychiatric Specialty Exam:     Blood pressure 143/65, pulse 75, temperature 98.7 F (37.1 C), temperature source Oral,  resp. rate 16, SpO2 100.00%.There is no weight on file to calculate BMI.  General Appearance: Casual  Eye Contact::  Fair  Speech:  Garbled  Volume:  Normal  Mood:  Euthymic  Affect:  Blunt  Thought Process:  Irrevelant  Orientation:  Full (Time, Place, and Person)  Thought Content:  Denies  Suicidal Thoughts:  No  Homicidal Thoughts:  No  Memory:  Fair  Judgement:  Fair  Insight:  Fair  Psychomotor Activity:  Normal  Concentration:  Fair  Recall:  [place] of Knowledge: Fair  Language: Fair  Akathisia:  Yes  Handed:  Right  AIMS (if indicated):     Assets:  Housing Social Support  Sleep:      Musculoskeletal: Strength & Muscle Tone: within normal limits Gait & Station: normal Patient leans: N/A  Treatment Plan Summary: Discharge home with  her niece and follow-up with her regular provider.  Waylan Boga PMH-NP 05/07/2014 9:02 AM

## 2014-05-07 NOTE — Discharge Instructions (Signed)
Schizoaffective Disorder °Schizoaffective disorder (ScAD) is a mental illness. It causes symptoms that are a mixture of schizophrenia (a psychotic disorder) and an affective (mood) disorder. The schizophrenic symptoms may include delusions, hallucinations, or odd behavior. The mood symptoms may be similar to major depression or bipolar disorder. ScAD may interfere with personal relationships or normal daily activities. People with ScAD are at increased risk for job loss, social isolation, physical health problems, anxiety and substance use disorders, and suicide. °ScAD usually occurs in cycles. Periods of severe symptoms are followed by periods of  less severe symptoms or improvement. The illness affects men and women equally but usually appears at an earlier age (teenage or early adult years) in men. People who have family members with schizophrenia, bipolar disorder, or ScAD are at higher risk of developing ScAD. °SYMPTOMS  °At any one time, people with ScAD may have psychotic symptoms only or both psychotic and mood symptoms. The psychotic symptoms include one or more of the following: °· Hearing, seeing, or feeling things that are not there (hallucinations).   °· Having fixed, false beliefs (delusions). The delusions usually are of being attacked, harassed, cheated, persecuted, or conspired against (paranoid delusions). °· Speaking in a way that makes no sense to others (disorganized speech). °The psychotic symptoms of ScAD may also include confusing or odd behavior or any of the negative symptoms of schizophrenia. These include loss of motivation for normal daily activities, such as bathing or grooming, withdrawal from other people, and lack of emotions.    °The mood symptoms of ScAD occur more often than not. They resemble major depressive disorder or bipolar mania. Symptoms of major depression include depressed mood and four or more of the following: °· Loss of interest in usually pleasurable activities  (anhedonia). °· Sleeping more or less than normal. °· Feeling worthless or excessively guilty. °· Lack of energy or motivation. °· Trouble concentrating. °· Eating more or less than usual. °· Thinking a lot about death or suicide. °Symptoms of bipolar mania include abnormally elevated or irritable mood and increased energy or activity, plus three or more of the following:   °· More confidence than normal or feeling that you are able to do anything (grandiosity). °· Feeling rested with less sleep than normal.   °· Being easily distracted.   °· Talking more than usual or feeling pressured to keep talking.   °· Feeling that your thoughts are racing. °· Engaging in high-risk activities such as buying sprees or foolish business decisions. °DIAGNOSIS  °ScAD is diagnosed through an assessment by your health care provider. Your health care provider will observe and ask questions about your thoughts, behavior, mood, and ability to function in daily life. Your health care provider may also ask questions about your medical history and use of drugs, including prescription medicines. Your health care provider may also order blood tests and imaging exams. Certain medical conditions and substances can cause symptoms that resemble ScAD. Your health care provider may refer you to a mental health specialist for evaluation.  °ScAD is divided into two types. The depressive type is diagnosed if your mood symptoms are limited to major depression. The bipolar type is diagnosed if your mood symptoms are manic or a mixture of manic and depressive symptoms °TREATMENT  °ScAD is usually a life-long illness. Long-term treatment is necessary. The following treatments are available: °· Medicine--Different types of medicine are used to treat ScAD. The exact combination depends on the type and severity of your symptoms. Antipsychotic medicine is used to control psychotic symptoms such as delusions, paranoia, and   hallucinations. Mood stabilizers can  even the highs and lows of bipolar manic mood swings. Antidepressant medicines are used to treat major depressive symptoms. °· Counseling or talk therapy--Individual, group, or family counseling may be helpful in providing education, support, and guidance. Many people with ScAD also benefit from social skills and job skills (vocational) training. °A combination of medicine and counseling is usually best for managing the disorder over time. A procedure in which electricity is applied to the brain through the scalp (electroconvulsive therapy) may be used to treat people with severe manic symptoms that do not respond to medicine and counseling. °HOME CARE INSTRUCTIONS  °· Take all your medicine as prescribed. °· Check with your health care provider before starting new prescription or over-the-counter medicines. °· Keep all follow up appointments with your health care provider. °SEEK MEDICAL CARE IF:  °· If you are not able to take your medicines as prescribed. °· If your symptoms get worse. °SEEK IMMEDIATE MEDICAL CARE IF:  °· You have serious thoughts about hurting yourself or others. °Document Released: 02/15/2007 Document Revised: 07/26/2013 Document Reviewed: 05/19/2013 °[employer]® Patient Information ©2015 [employer]. This information is not intended to replace advice given to you by your health care provider. Make sure you discuss any questions you have with your health care provider. ° °

## 2014-05-07 NOTE — Progress Notes (Signed)
CSW spoke with the patient 's niece who reports that she will arrive at the ED around 6:00pm to pick the patient up for discharge.    Maryelizabeth Rowanressa Gabi Mcfate, MSW, [place], 05/07/2014 Evening Clinical Social Worker [phone]

## 2014-05-07 NOTE — ED Provider Notes (Signed)
Psych notes indicate patient is ready for discharge as she appears at her baseline and has no SI. Psych has printed keflex for her UTI. Family to pick up patient.   Kaylee CamelScott T Shayda Kalka, MD 05/07/14 [phone]

## 2014-05-07 NOTE — [facility] Suicide Risk Assessment (Signed)
Suicide Risk Assessment  Discharge Assessment     Demographic Factors:  Age 66 or older  Total Time spent with patient: 20 minutes  Psychiatric Specialty Exam:     Blood pressure 143/65, pulse 75, temperature 98.7 F (37.1 C), temperature source Oral, resp. rate 16, SpO2 100.00%.There is no weight on file to calculate BMI.  General Appearance: Casual  Eye Contact::  Fair  Speech:  Garbled  Volume:  Normal  Mood:  Euthymic  Affect:  Blunt  Thought Process:  Irrevelant  Orientation:  Full (Time, Place, and Person)  Thought Content:  Denies  Suicidal Thoughts:  No  Homicidal Thoughts:  No  Memory:  Fair  Judgement:  Fair  Insight:  Fair  Psychomotor Activity:  Normal  Concentration:  Fair  Recall:  Fair  Fund of Knowledge: Fair  Language: Fair  Akathisia:  Yes  Handed:  Right  AIMS (if indicated):     Assets:  Housing Social Support  Sleep:      Musculoskeletal: Strength & Muscle Tone: within normal limits Gait & Station: normal Patient leans: N/A   Mental Status Per Nursing Assessment::   On Admission:   Psychosis  Current Mental Status by Physician: NA  Loss Factors: NA  Historical Factors: NA  Risk Reduction Factors:   Sense of responsibility to family, Religious beliefs about death, Living with another person, especially a relative, Positive social support and Positive therapeutic relationship  Continued Clinical Symptoms:  None  Cognitive Features That Contribute To Risk:  None  Suicide Risk:  Minimal: No identifiable suicidal ideation.  Patients presenting with no risk factors but with morbid ruminations; may be classified as minimal risk based on the severity of the depressive symptoms  Discharge Diagnoses:   AXIS I:  Schizoaffective Disorder AXIS II:  Deferred AXIS III:   Past Medical History  Diagnosis Date  . Hypertension   . Pre-diabetes   . Bipolar affective disorder   . Schizophrenic disorder    AXIS IV:  chronic mental  illness AXIS V:  61-70 mild symptoms  Plan Of Care/Follow-up recommendations:  Activity:  as tolerated Diet:  low-sodium heart healthy diet  Is patient on multiple antipsychotic therapies at discharge:  No   Has Patient had three or more failed trials of antipsychotic monotherapy by history:  No  Recommended Plan for Multiple Antipsychotic Therapies: NA    Kaylee Kelley, PMH-NP 05/07/2014, 9:04 AM

## 2014-05-08 NOTE — ED Provider Notes (Signed)
Medical screening examination/treatment/procedure(s) were conducted as a shared visit with non-physician practitioner(s) and myself.  I personally evaluated the patient during the encounter.   EKG Interpretation None      I interviewed and examined the patient. Lungs are CTAB. Cardiac exam wnl. Abdomen soft.  Pt more calm after haldol. Will consult psychiatry for eval.   Junius ArgyleForrest S Jorden Mahl, MD 05/08/14 [id number]

## 2014-06-15 IMAGING — CR DG SHOULDER 2+V*R*
4 series · 4 of 4 positions shown · non-contrast
Comparison: None.

CLINICAL DATA: Fell while walking upstairs.  Generalized right
shoulder pain.

RIGHT SHOULDER - 2+ VIEW

[view not recorded (1 of 4)]
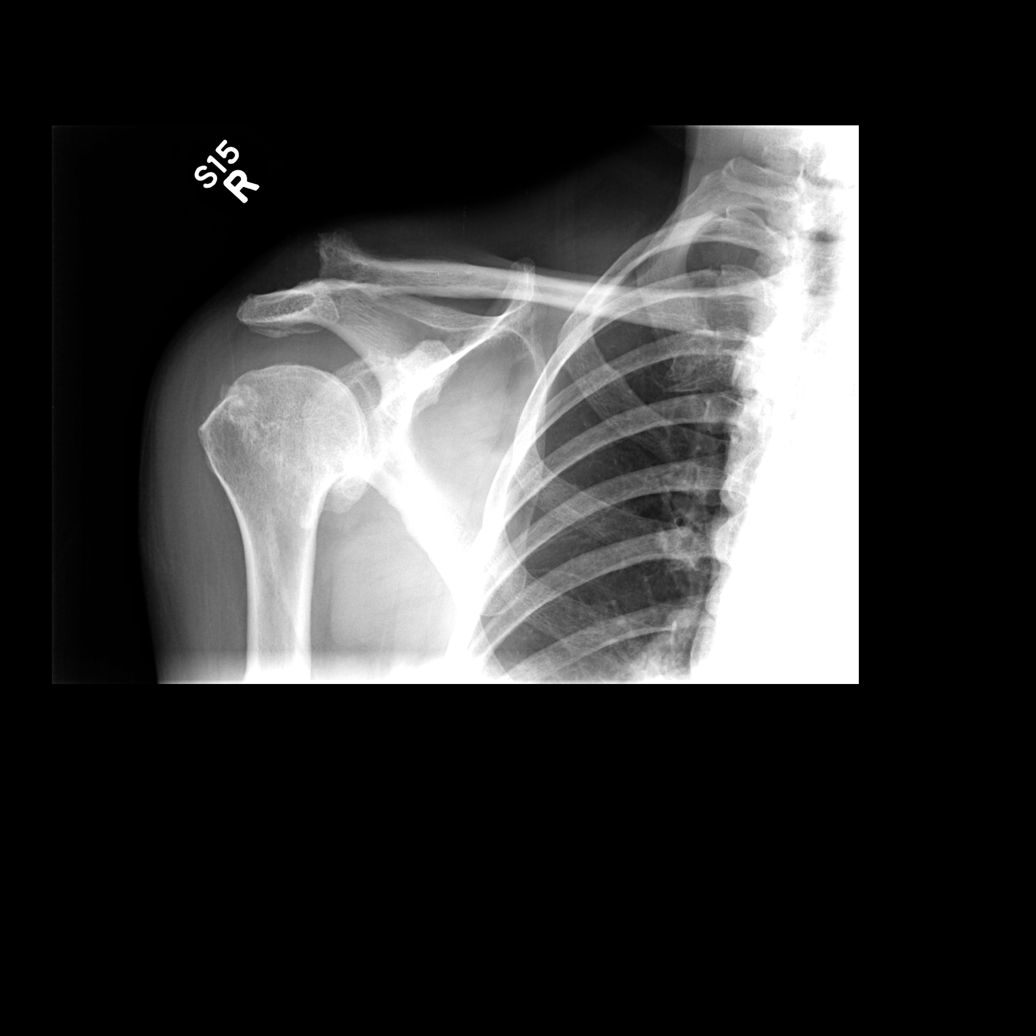

[view not recorded (2 of 4)]
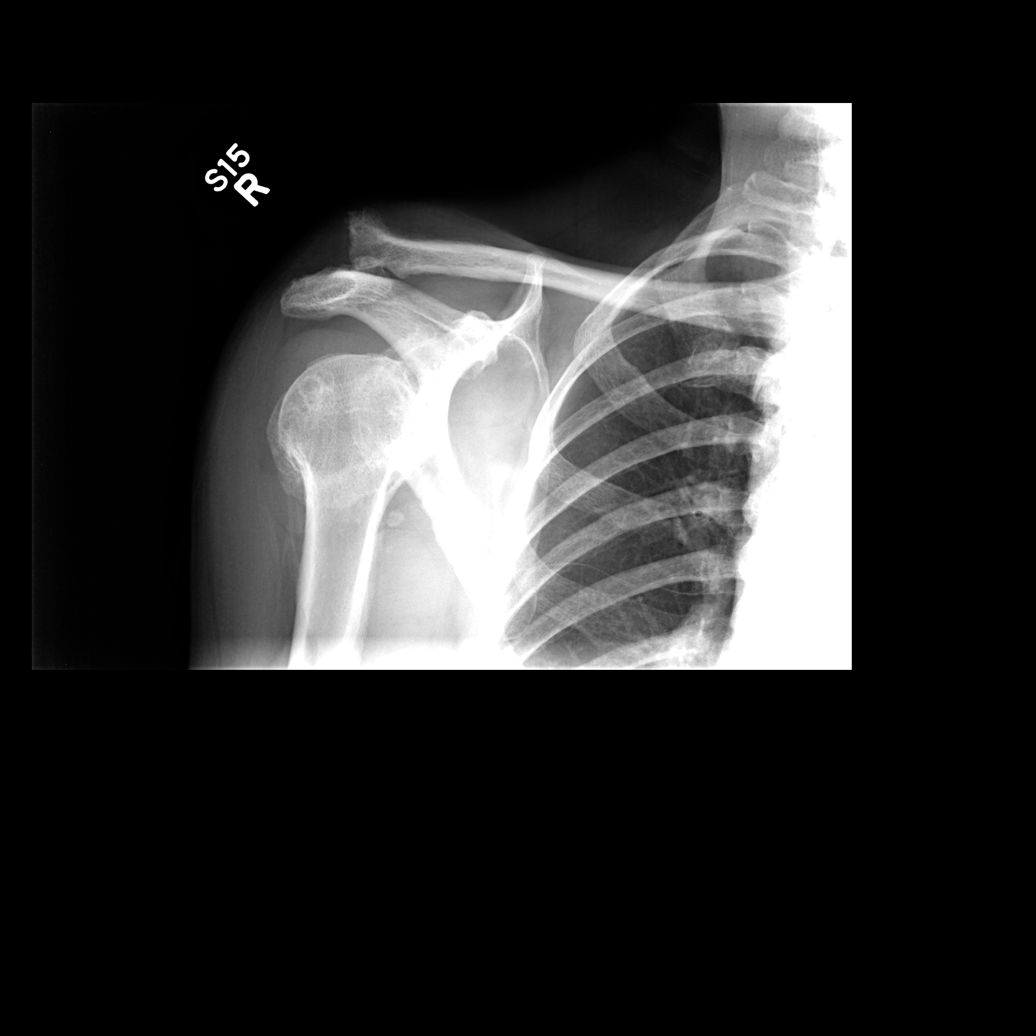

[view not recorded (3 of 4)]
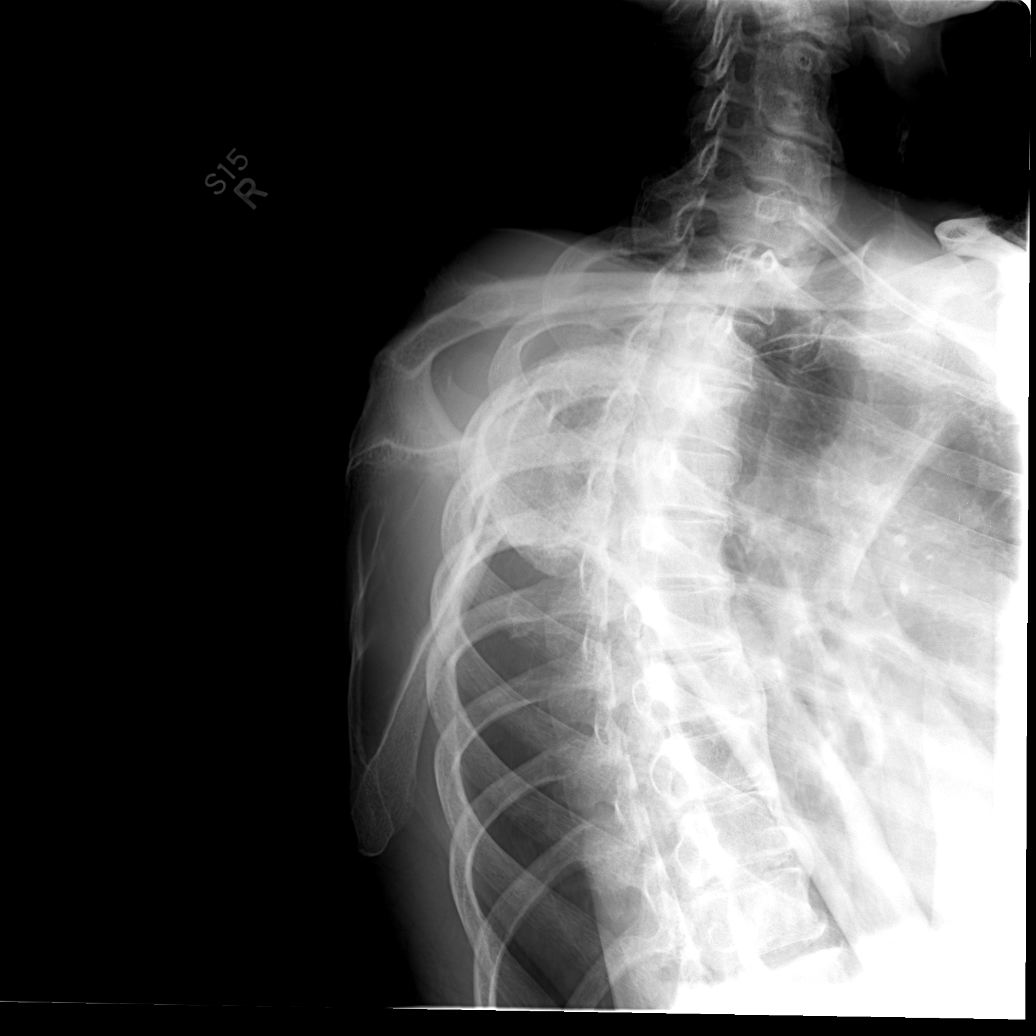

[view not recorded (4 of 4)]
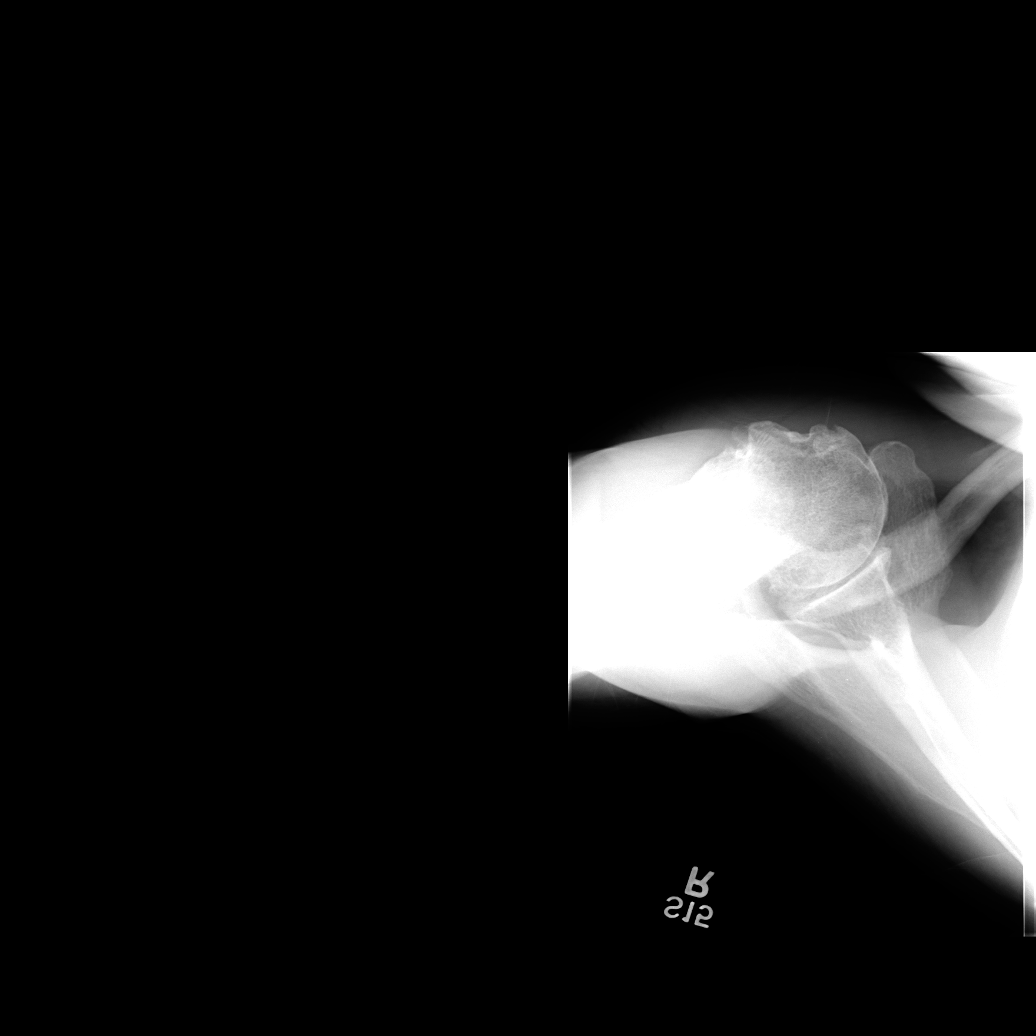

[4 of 4 positions shown; findings below may reference images not displayed]

FINDINGS: No evidence of acute or subacute fracture or glenohumeral
dislocation.  Joint space narrowing involving the glenohumeral
joint with hypertrophic spurring involving the glenoid and the
humeral head.  Separation of the acromioclavicular joint, with the
distal clavicle displaced superior to the acromion by one complete
width of the clavicle.  Bone mineral density well preserved for
age.
IMPRESSION: Acromioclavicular separation.  No evidence of acute fracture or
glenohumeral dislocation.  Severe degenerative changes involving
the glenohumeral joint.

## 2014-06-19 DIAGNOSIS — F209 Schizophrenia, unspecified: Secondary | ICD-10-CM | POA: Diagnosis not present

## 2014-07-12 DIAGNOSIS — Z79899 Other long term (current) drug therapy: Secondary | ICD-10-CM | POA: Diagnosis not present

## 2014-08-07 DIAGNOSIS — F209 Schizophrenia, unspecified: Secondary | ICD-10-CM | POA: Diagnosis not present

## 2014-08-22 DIAGNOSIS — I10 Essential (primary) hypertension: Secondary | ICD-10-CM | POA: Diagnosis not present

## 2014-08-22 DIAGNOSIS — J302 Other seasonal allergic rhinitis: Secondary | ICD-10-CM | POA: Diagnosis not present

## 2014-08-22 DIAGNOSIS — M170 Bilateral primary osteoarthritis of knee: Secondary | ICD-10-CM | POA: Diagnosis not present

## 2014-08-22 DIAGNOSIS — Z23 Encounter for immunization: Secondary | ICD-10-CM | POA: Diagnosis not present

## 2014-08-22 DIAGNOSIS — D638 Anemia in other chronic diseases classified elsewhere: Secondary | ICD-10-CM | POA: Diagnosis not present

## 2014-08-22 DIAGNOSIS — E1140 Type 2 diabetes mellitus with diabetic neuropathy, unspecified: Secondary | ICD-10-CM | POA: Diagnosis not present

## 2014-11-01 DIAGNOSIS — F209 Schizophrenia, unspecified: Secondary | ICD-10-CM | POA: Diagnosis not present

## 2014-11-21 DIAGNOSIS — E1040 Type 1 diabetes mellitus with diabetic neuropathy, unspecified: Secondary | ICD-10-CM | POA: Diagnosis not present

## 2014-11-21 DIAGNOSIS — I10 Essential (primary) hypertension: Secondary | ICD-10-CM | POA: Diagnosis not present

## 2014-11-21 DIAGNOSIS — Z13220 Encounter for screening for lipoid disorders: Secondary | ICD-10-CM | POA: Diagnosis not present

## 2014-11-21 DIAGNOSIS — F319 Bipolar disorder, unspecified: Secondary | ICD-10-CM | POA: Diagnosis not present

## 2014-11-21 DIAGNOSIS — E1140 Type 2 diabetes mellitus with diabetic neuropathy, unspecified: Secondary | ICD-10-CM | POA: Diagnosis not present

## 2015-02-18 DIAGNOSIS — E1140 Type 2 diabetes mellitus with diabetic neuropathy, unspecified: Secondary | ICD-10-CM | POA: Diagnosis not present

## 2015-02-18 DIAGNOSIS — F319 Bipolar disorder, unspecified: Secondary | ICD-10-CM | POA: Diagnosis not present

## 2015-02-18 DIAGNOSIS — I10 Essential (primary) hypertension: Secondary | ICD-10-CM | POA: Diagnosis not present

## 2015-02-18 DIAGNOSIS — J302 Other seasonal allergic rhinitis: Secondary | ICD-10-CM | POA: Diagnosis not present

## 2015-02-26 DIAGNOSIS — F209 Schizophrenia, unspecified: Secondary | ICD-10-CM | POA: Diagnosis not present

## 2015-04-01 DIAGNOSIS — I10 Essential (primary) hypertension: Secondary | ICD-10-CM | POA: Diagnosis not present

## 2015-04-01 DIAGNOSIS — D638 Anemia in other chronic diseases classified elsewhere: Secondary | ICD-10-CM | POA: Diagnosis not present

## 2015-04-01 DIAGNOSIS — J302 Other seasonal allergic rhinitis: Secondary | ICD-10-CM | POA: Diagnosis not present

## 2015-04-01 DIAGNOSIS — E1040 Type 1 diabetes mellitus with diabetic neuropathy, unspecified: Secondary | ICD-10-CM | POA: Diagnosis not present

## 2015-04-01 DIAGNOSIS — F319 Bipolar disorder, unspecified: Secondary | ICD-10-CM | POA: Diagnosis not present

## 2015-04-01 DIAGNOSIS — E1140 Type 2 diabetes mellitus with diabetic neuropathy, unspecified: Secondary | ICD-10-CM | POA: Diagnosis not present

## 2015-06-27 DIAGNOSIS — F209 Schizophrenia, unspecified: Secondary | ICD-10-CM | POA: Diagnosis not present

## 2015-08-26 DIAGNOSIS — F209 Schizophrenia, unspecified: Secondary | ICD-10-CM | POA: Diagnosis not present

## 2015-11-18 DIAGNOSIS — F209 Schizophrenia, unspecified: Secondary | ICD-10-CM | POA: Diagnosis not present

## 2015-12-26 DIAGNOSIS — I10 Essential (primary) hypertension: Secondary | ICD-10-CM | POA: Diagnosis not present

## 2015-12-26 DIAGNOSIS — E1040 Type 1 diabetes mellitus with diabetic neuropathy, unspecified: Secondary | ICD-10-CM | POA: Diagnosis not present

## 2015-12-26 DIAGNOSIS — F319 Bipolar disorder, unspecified: Secondary | ICD-10-CM | POA: Diagnosis not present

## 2015-12-26 DIAGNOSIS — E1140 Type 2 diabetes mellitus with diabetic neuropathy, unspecified: Secondary | ICD-10-CM | POA: Diagnosis not present

## 2016-03-11 DIAGNOSIS — F209 Schizophrenia, unspecified: Secondary | ICD-10-CM | POA: Diagnosis not present

## 2016-03-24 DIAGNOSIS — I10 Essential (primary) hypertension: Secondary | ICD-10-CM | POA: Diagnosis not present

## 2016-03-24 DIAGNOSIS — F319 Bipolar disorder, unspecified: Secondary | ICD-10-CM | POA: Diagnosis not present

## 2016-03-24 DIAGNOSIS — J302 Other seasonal allergic rhinitis: Secondary | ICD-10-CM | POA: Diagnosis not present

## 2016-03-24 DIAGNOSIS — E1140 Type 2 diabetes mellitus with diabetic neuropathy, unspecified: Secondary | ICD-10-CM | POA: Diagnosis not present

## 2016-03-24 DIAGNOSIS — E1040 Type 1 diabetes mellitus with diabetic neuropathy, unspecified: Secondary | ICD-10-CM | POA: Diagnosis not present

## 2016-03-24 DIAGNOSIS — E119 Type 2 diabetes mellitus without complications: Secondary | ICD-10-CM | POA: Diagnosis not present

## 2016-05-08 DIAGNOSIS — F209 Schizophrenia, unspecified: Secondary | ICD-10-CM | POA: Diagnosis not present

## 2016-07-30 DIAGNOSIS — F209 Schizophrenia, unspecified: Secondary | ICD-10-CM | POA: Diagnosis not present

## 2016-08-05 DIAGNOSIS — E2839 Other primary ovarian failure: Secondary | ICD-10-CM | POA: Diagnosis not present

## 2016-08-05 DIAGNOSIS — E1140 Type 2 diabetes mellitus with diabetic neuropathy, unspecified: Secondary | ICD-10-CM | POA: Diagnosis not present

## 2016-08-05 DIAGNOSIS — I10 Essential (primary) hypertension: Secondary | ICD-10-CM | POA: Diagnosis not present

## 2016-08-05 DIAGNOSIS — Z23 Encounter for immunization: Secondary | ICD-10-CM | POA: Diagnosis not present

## 2016-08-05 DIAGNOSIS — F319 Bipolar disorder, unspecified: Secondary | ICD-10-CM | POA: Diagnosis not present

## 2016-08-07 ENCOUNTER — Other Ambulatory Visit: Payer: Self-pay | Admitting: Internal Medicine

## 2016-08-07 DIAGNOSIS — E2839 Other primary ovarian failure: Secondary | ICD-10-CM

## 2016-12-28 DIAGNOSIS — F209 Schizophrenia, unspecified: Secondary | ICD-10-CM | POA: Diagnosis not present

## 2017-01-12 DIAGNOSIS — I10 Essential (primary) hypertension: Secondary | ICD-10-CM | POA: Diagnosis not present

## 2017-01-12 DIAGNOSIS — E1140 Type 2 diabetes mellitus with diabetic neuropathy, unspecified: Secondary | ICD-10-CM | POA: Diagnosis not present

## 2017-01-12 DIAGNOSIS — M170 Bilateral primary osteoarthritis of knee: Secondary | ICD-10-CM | POA: Diagnosis not present

## 2017-01-12 DIAGNOSIS — F319 Bipolar disorder, unspecified: Secondary | ICD-10-CM | POA: Diagnosis not present

## 2017-01-12 DIAGNOSIS — Z1239 Encounter for other screening for malignant neoplasm of breast: Secondary | ICD-10-CM | POA: Diagnosis not present

## 2017-01-12 DIAGNOSIS — E1040 Type 1 diabetes mellitus with diabetic neuropathy, unspecified: Secondary | ICD-10-CM | POA: Diagnosis not present

## 2017-04-19 DIAGNOSIS — F209 Schizophrenia, unspecified: Secondary | ICD-10-CM | POA: Diagnosis not present

## 2017-06-25 DIAGNOSIS — I10 Essential (primary) hypertension: Secondary | ICD-10-CM | POA: Diagnosis not present

## 2017-06-25 DIAGNOSIS — E1140 Type 2 diabetes mellitus with diabetic neuropathy, unspecified: Secondary | ICD-10-CM | POA: Diagnosis not present

## 2017-06-25 DIAGNOSIS — F319 Bipolar disorder, unspecified: Secondary | ICD-10-CM | POA: Diagnosis not present

## 2017-06-25 DIAGNOSIS — E1040 Type 1 diabetes mellitus with diabetic neuropathy, unspecified: Secondary | ICD-10-CM | POA: Diagnosis not present

## 2017-09-30 DIAGNOSIS — F209 Schizophrenia, unspecified: Secondary | ICD-10-CM | POA: Diagnosis not present

## 2017-12-23 DIAGNOSIS — F209 Schizophrenia, unspecified: Secondary | ICD-10-CM | POA: Diagnosis not present

## 2017-12-29 DIAGNOSIS — E1040 Type 1 diabetes mellitus with diabetic neuropathy, unspecified: Secondary | ICD-10-CM | POA: Diagnosis not present

## 2017-12-29 DIAGNOSIS — I10 Essential (primary) hypertension: Secondary | ICD-10-CM | POA: Diagnosis not present

## 2017-12-29 DIAGNOSIS — F319 Bipolar disorder, unspecified: Secondary | ICD-10-CM | POA: Diagnosis not present

## 2017-12-29 DIAGNOSIS — E1140 Type 2 diabetes mellitus with diabetic neuropathy, unspecified: Secondary | ICD-10-CM | POA: Diagnosis not present

## 2018-03-31 DIAGNOSIS — E1140 Type 2 diabetes mellitus with diabetic neuropathy, unspecified: Secondary | ICD-10-CM | POA: Diagnosis not present

## 2018-03-31 DIAGNOSIS — E559 Vitamin D deficiency, unspecified: Secondary | ICD-10-CM | POA: Diagnosis not present

## 2018-03-31 DIAGNOSIS — I10 Essential (primary) hypertension: Secondary | ICD-10-CM | POA: Diagnosis not present

## 2018-03-31 DIAGNOSIS — F319 Bipolar disorder, unspecified: Secondary | ICD-10-CM | POA: Diagnosis not present

## 2018-04-26 DIAGNOSIS — F209 Schizophrenia, unspecified: Secondary | ICD-10-CM | POA: Diagnosis not present

## 2018-06-30 DIAGNOSIS — F319 Bipolar disorder, unspecified: Secondary | ICD-10-CM | POA: Diagnosis not present

## 2018-06-30 DIAGNOSIS — E669 Obesity, unspecified: Secondary | ICD-10-CM | POA: Diagnosis not present

## 2018-06-30 DIAGNOSIS — I10 Essential (primary) hypertension: Secondary | ICD-10-CM | POA: Diagnosis not present

## 2018-06-30 DIAGNOSIS — Z23 Encounter for immunization: Secondary | ICD-10-CM | POA: Diagnosis not present

## 2018-06-30 DIAGNOSIS — E1140 Type 2 diabetes mellitus with diabetic neuropathy, unspecified: Secondary | ICD-10-CM | POA: Diagnosis not present

## 2018-06-30 DIAGNOSIS — M170 Bilateral primary osteoarthritis of knee: Secondary | ICD-10-CM | POA: Diagnosis not present

## 2018-08-01 DIAGNOSIS — F209 Schizophrenia, unspecified: Secondary | ICD-10-CM | POA: Diagnosis not present

## 2018-09-29 DIAGNOSIS — I10 Essential (primary) hypertension: Secondary | ICD-10-CM | POA: Diagnosis not present

## 2018-09-29 DIAGNOSIS — E1140 Type 2 diabetes mellitus with diabetic neuropathy, unspecified: Secondary | ICD-10-CM | POA: Diagnosis not present

## 2018-09-29 DIAGNOSIS — M179 Osteoarthritis of knee, unspecified: Secondary | ICD-10-CM | POA: Diagnosis not present

## 2018-09-29 DIAGNOSIS — E1040 Type 1 diabetes mellitus with diabetic neuropathy, unspecified: Secondary | ICD-10-CM | POA: Diagnosis not present

## 2018-09-29 DIAGNOSIS — J302 Other seasonal allergic rhinitis: Secondary | ICD-10-CM | POA: Diagnosis not present

## 2018-09-29 DIAGNOSIS — F319 Bipolar disorder, unspecified: Secondary | ICD-10-CM | POA: Diagnosis not present

## 2020-01-14 ENCOUNTER — Ambulatory Visit: Payer: Medicare Other

## 2020-01-22 ENCOUNTER — Ambulatory Visit: Payer: Medicare Other | Attending: Internal Medicine

## 2020-01-25 ENCOUNTER — Ambulatory Visit: Payer: Medicare Other | Attending: Family

## 2020-01-25 DIAGNOSIS — Z23 Encounter for immunization: Secondary | ICD-10-CM

## 2020-01-25 NOTE — Progress Notes (Signed)
   Covid-[facility] Vaccination [facility]  Name:  Nancie Bocanegra    MRN: [id number] DOB: 1946-08-03  01/25/2020  Ms. Casavant was observed post Covid-19 immunization for 15 minutes without incident. She was provided with Vaccine Information Sheet and instruction to access the V-Safe system.   Ms. Corona was instructed to call 911 with any severe reactions post vaccine: Marland Kitchen Difficulty breathing  . Swelling of face and throat  . A fast heartbeat  . A bad rash all over body  . Dizziness and weakness   Immunizations Administered    Name Date Dose VIS Date Route   Moderna COVID-19 Vaccine 01/25/2020 11:58 AM 0.5 mL 09/19/2019 Intramuscular   Manufacturer: Moderna   Lot: [id number]   NDC: 92010-071-21

## 2020-02-27 ENCOUNTER — Ambulatory Visit: Payer: Medicare Other | Attending: Internal Medicine
